# Patient Record
Sex: Male | Born: 1945 | Race: White | Hispanic: No | Marital: Married | Smoking: Current every day smoker
Health system: Southern US, Community
[De-identification: ages and names within clinical notes are randomized; demographics above are authoritative.]

## PROBLEM LIST (undated history)

## (undated) DIAGNOSIS — K819 Cholecystitis, unspecified: Secondary | ICD-10-CM

## (undated) DIAGNOSIS — I447 Left bundle-branch block, unspecified: Secondary | ICD-10-CM

## (undated) DIAGNOSIS — G629 Polyneuropathy, unspecified: Secondary | ICD-10-CM

## (undated) HISTORY — PX: HERNIA REPAIR: SHX51

## (undated) HISTORY — PX: APPENDECTOMY: SHX54

---

## 2005-09-08 ENCOUNTER — Emergency Department (HOSPITAL_COMMUNITY): Admission: EM | Admit: 2005-09-08 | Discharge: 2005-09-08 | Payer: Self-pay | Admitting: Emergency Medicine

## 2005-10-31 ENCOUNTER — Inpatient Hospital Stay (HOSPITAL_COMMUNITY): Admission: EM | Admit: 2005-10-31 | Discharge: 2005-11-01 | Payer: Self-pay | Admitting: *Deleted

## 2012-02-25 ENCOUNTER — Emergency Department (HOSPITAL_BASED_OUTPATIENT_CLINIC_OR_DEPARTMENT_OTHER)
Admission: EM | Admit: 2012-02-25 | Discharge: 2012-02-26 | Disposition: A | Discharge status: Home / Self Care | Payer: Medicare Other | Source: Home / Self Care | Attending: Emergency Medicine | Admitting: Emergency Medicine

## 2012-02-25 ENCOUNTER — Emergency Department (HOSPITAL_BASED_OUTPATIENT_CLINIC_OR_DEPARTMENT_OTHER): Payer: Medicare Other

## 2012-02-25 ENCOUNTER — Encounter (HOSPITAL_BASED_OUTPATIENT_CLINIC_OR_DEPARTMENT_OTHER): Payer: Self-pay | Admitting: *Deleted

## 2012-02-25 DIAGNOSIS — K8020 Calculus of gallbladder without cholecystitis without obstruction: Secondary | ICD-10-CM | POA: Diagnosis present

## 2012-02-25 DIAGNOSIS — E871 Hypo-osmolality and hyponatremia: Secondary | ICD-10-CM | POA: Diagnosis not present

## 2012-02-25 DIAGNOSIS — K831 Obstruction of bile duct: Secondary | ICD-10-CM | POA: Diagnosis present

## 2012-02-25 DIAGNOSIS — Z79899 Other long term (current) drug therapy: Secondary | ICD-10-CM | POA: Insufficient documentation

## 2012-02-25 DIAGNOSIS — F17200 Nicotine dependence, unspecified, uncomplicated: Secondary | ICD-10-CM | POA: Diagnosis present

## 2012-02-25 DIAGNOSIS — R7402 Elevation of levels of lactic acid dehydrogenase (LDH): Secondary | ICD-10-CM | POA: Insufficient documentation

## 2012-02-25 DIAGNOSIS — R109 Unspecified abdominal pain: Secondary | ICD-10-CM | POA: Diagnosis not present

## 2012-02-25 DIAGNOSIS — J189 Pneumonia, unspecified organism: Secondary | ICD-10-CM

## 2012-02-25 DIAGNOSIS — D72819 Decreased white blood cell count, unspecified: Secondary | ICD-10-CM | POA: Diagnosis present

## 2012-02-25 DIAGNOSIS — R509 Fever, unspecified: Secondary | ICD-10-CM

## 2012-02-25 DIAGNOSIS — Z7982 Long term (current) use of aspirin: Secondary | ICD-10-CM

## 2012-02-25 DIAGNOSIS — D6959 Other secondary thrombocytopenia: Secondary | ICD-10-CM | POA: Diagnosis present

## 2012-02-25 DIAGNOSIS — G609 Hereditary and idiopathic neuropathy, unspecified: Secondary | ICD-10-CM | POA: Diagnosis present

## 2012-02-25 DIAGNOSIS — R0981 Nasal congestion: Secondary | ICD-10-CM

## 2012-02-25 DIAGNOSIS — M6282 Rhabdomyolysis: Secondary | ICD-10-CM | POA: Diagnosis present

## 2012-02-25 DIAGNOSIS — R7401 Elevation of levels of liver transaminase levels: Secondary | ICD-10-CM | POA: Insufficient documentation

## 2012-02-25 DIAGNOSIS — A419 Sepsis, unspecified organism: Principal | ICD-10-CM | POA: Diagnosis present

## 2012-02-25 DIAGNOSIS — R6520 Severe sepsis without septic shock: Secondary | ICD-10-CM | POA: Diagnosis present

## 2012-02-25 DIAGNOSIS — I5023 Acute on chronic systolic (congestive) heart failure: Secondary | ICD-10-CM | POA: Diagnosis not present

## 2012-02-25 DIAGNOSIS — I447 Left bundle-branch block, unspecified: Secondary | ICD-10-CM | POA: Diagnosis present

## 2012-02-25 DIAGNOSIS — J9600 Acute respiratory failure, unspecified whether with hypoxia or hypercapnia: Secondary | ICD-10-CM | POA: Diagnosis not present

## 2012-02-25 DIAGNOSIS — R0902 Hypoxemia: Secondary | ICD-10-CM | POA: Diagnosis not present

## 2012-02-25 DIAGNOSIS — E876 Hypokalemia: Secondary | ICD-10-CM | POA: Diagnosis not present

## 2012-02-25 LAB — COMPREHENSIVE METABOLIC PANEL
ALT: 89 U/L — ABNORMAL HIGH (ref 0–53)
AST: 106 U/L — ABNORMAL HIGH (ref 0–37)
Albumin: 3.4 g/dL — ABNORMAL LOW (ref 3.5–5.2)
Chloride: 92 mEq/L — ABNORMAL LOW (ref 96–112)
GFR calc Af Amer: >90 mL/min (ref >90)
Potassium: 4 mEq/L (ref 3.5–5.1)
Total Bilirubin: 0.9 mg/dL (ref 0.3–1.2)

## 2012-02-25 LAB — CBC
MCH: 31.8 pg (ref 26.0–34.0)
MCV: 86.5 fL (ref 78.0–100.0)
Platelets: 99 10*3/uL — ABNORMAL LOW (ref 150–400)
RDW: 12.4 % (ref 11.5–15.5)

## 2012-02-25 LAB — URINALYSIS, ROUTINE W REFLEX MICROSCOPIC
Ketones, ur: 15 mg/dL — AB
Protein, ur: 100 mg/dL — AB
Specific Gravity, Urine: 1.027 (ref 1.005–1.030)
Urobilinogen, UA: 1 mg/dL (ref 0.0–1.0)
pH: 6.5 (ref 5.0–8.0)

## 2012-02-25 LAB — URINE MICROSCOPIC-ADD ON

## 2012-02-25 MED ORDER — IBUPROFEN 800 MG PO TABS
800.0000 mg | ORAL_TABLET | Freq: Once | ORAL | Status: AC
  Administered 2012-02-25: 800 mg via ORAL
  Filled 2012-02-25: qty 1

## 2012-02-25 MED ORDER — SODIUM CHLORIDE 0.9 % IV BOLUS (SEPSIS)
500.0000 mL | Freq: Once | INTRAVENOUS | Status: AC
  Administered 2012-02-25: 500 mL via INTRAVENOUS

## 2012-02-25 MED ORDER — ONDANSETRON HCL 4 MG/2ML IJ SOLN
4.0000 mg | Freq: Once | INTRAMUSCULAR | Status: AC
  Administered 2012-02-25: 4 mg via INTRAVENOUS
  Filled 2012-02-25: qty 2

## 2012-02-25 MED ORDER — MORPHINE SULFATE 4 MG/ML IJ SOLN
4.0000 mg | Freq: Once | INTRAMUSCULAR | Status: AC
  Administered 2012-02-25: 4 mg via INTRAVENOUS
  Filled 2012-02-25: qty 1

## 2012-02-25 NOTE — ED Notes (Signed)
Pt c/o facial pain and nasal congestion x 4 days

## 2012-02-25 NOTE — ED Provider Notes (Signed)
History     CSN: [id number]  Arrival date & time 02/25/12  2107   First MD Initiated Contact with Patient 02/25/12 2205      Chief Complaint  Patient presents with  . Facial Pain  . Nasal Congestion  . Abdominal Pain    (Consider location/radiation/quality/duration/timing/severity/associated sxs/prior treatment) HPI Pt presents with fever, nasal congestion and abdominal pain.  Pt states that over the past several days he has had difficulty breathing through his nose and felt that he had chest congestion, mild cough- nonproductive.  Did not know of fever.  Also c/o epigastric and RUQ pain over the past several days intermittently- described aching and sharp.  +nausea, no vomiting, no diarrhea.  Has noted that his urine has turned darker-orange/red over the past 2 days.  Denies having similar symptoms in the past.  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 Denies other medical history.   Past Medical History  Diagnosis Date  . Peripheral neuropathy   . Neuropathy 02/26/2012  . Left bundle-branch block 02/26/2012    Past Surgical History  Procedure Date  . Appendectomy     History reviewed. No pertinent family history.  History  Substance Use Topics  . Smoking status: Current Everyday Smoker -- 1.0 packs/day  . Smokeless tobacco: Not on file  . Alcohol Use: No      Review of Systems ROS reviewed and all otherwise negative except for mentioned in HPI  Allergies  Review of patient's allergies indicates no known allergies.  Home Medications   No current outpatient prescriptions on file.  BP 125/50  Pulse 90  Temp 100.8 F (38.2 C) (Oral)  Resp 20  Ht 5\' 11"  (1.803 m)  Wt 220 lb (99.791 kg)  BMI 30.68 kg/m2  SpO2 94% Vitals reviewed Physical Exam Physical Examination: General appearance - alert, concerned appearing, and in no distress Mental status - alert, oriented to person, place, and time Mouth - mucous membranes  moist, pharynx normal without lesions Chest - clear to auscultation, no wheezes, rales or rhonchi, symmetric air entry Heart - normal rate, regular rhythm, normal S1, S2, no murmurs, rubs, clicks or gallops Abdomen - soft, nondistended, no masses, ttp over epigastric region and RUQ, no gaurding or rebound tenderness Musculoskeletal - no joint tenderness, deformity or swelling Extremities - peripheral pulses normal, no pedal edema, no clubbing or cyanosis Skin - normal coloration and turgor, no rashes, no petechiae or angiomata  ED Course  Procedures (including critical care time)  Labs Reviewed  CBC - Abnormal; Notable for the following:    WBC 2.9 (*)     RBC 3.77 (*)     Hemoglobin 12.0 (*)     HCT 32.6 (*)     MCHC 36.8 (*)     Platelets 99 (*)     All other components within normal limits  COMPREHENSIVE METABOLIC PANEL - Abnormal; Notable for the following:    Sodium 126 (*)     Chloride 92 (*)     Glucose, Bld 102 (*)     Albumin 3.4 (*)     AST 106 (*)     ALT 89 (*)     Alkaline Phosphatase 196 (*)     All other components within normal limits  URINALYSIS, ROUTINE W REFLEX MICROSCOPIC - Abnormal; Notable for the following:    Color, Urine ORANGE (*)  BIOCHEMICALS MAY BE AFFECTED BY COLOR   Bilirubin Urine MODERATE (*)  Ketones, ur 15 (*)     Protein, ur 100 (*)     Leukocytes, UA SMALL (*)     All other components within normal limits  DIFFERENTIAL - Abnormal; Notable for the following:    Basophils Relative 2 (*)     Lymphs Abs 0.5 (*)     All other components within normal limits  URINE MICROSCOPIC-ADD ON  AMMONIA   Dg Chest Port 1 View  02/26/2012  *RADIOLOGY REPORT*  Clinical Data: Pulmonary infiltrate.  Shortness of breath.  Chest pain.  PORTABLE CHEST - 1 VIEW  Comparison: 06/20 and 02/25/2012  Findings: There is a persistent hazy right perihilar infiltrate in the right upper lobe.  There is also persistent peribronchial thickening in the right lower lobe.   Heart size and vascularity are normal.  Left lung is clear except for peribronchial thickening.  IMPRESSION: Persistent hazy infiltrate in the right upper lobe with bilateral bronchitic changes.  Original Report Authenticated By: Gwynn Burly, M.D.   Dg Chest Portable 1 View  02/26/2012  *RADIOLOGY REPORT*  Clinical Data: Fever  PORTABLE CHEST - 1 VIEW  Comparison: 02/25/2012  Findings: Degraded by hypoaeration.  Interstitial prominence.  Mild right suprahilar and lung base opacities.  Mild cardiomegaly.  Mild central vascular congestion.  No pneumothorax.  No definite pleural effusion.  No acute osseous finding.  IMPRESSION: Interstitial prominence, bibasilar and asymmetric right suprahilar airspace opacities. May reflect pneumonia and/or edema. Allowing for differences in technique, similar to slightly worsened in the interval.  Original Report Authenticated By: Waneta Martins, M.D.   Dg Abd Portable 1v  02/26/2012  *RADIOLOGY REPORT*  Clinical Data: Shortness of breath, chest pain, abdominal pain and pressure  PORTABLE ABDOMEN - 1 VIEW  Comparison: Portable exam 1740 hours without priors for comparison.  Findings: Nonobstructive bowel gas pattern. No bowel dilatation or bowel wall thickening. Scattered gas within large and small bowel loops. Mild endplate spur formation lumbar spine. Osseous demineralization. No urinary tract calcification.  IMPRESSION: Nonobstructive bowel gas pattern.  Original Report Authenticated By: Lollie Marrow, M.D.     1. Fever   2. Abdominal pain   3. Nasal congestion   4. Transaminitis   5. Hyponatremia   6. Community acquired pneumonia       MDM  Pt with fever, abdominal pain, nasal congestion, cough.  New finding of elevated LFTs, hyponatremia, urobilinogen.  Pt given pain meds, IV hydration.  Awaiting CXR and abdominal ultrasound- signed out to Dr. Hyman Hopes at 12:00am pending radiological studies which will help determine disposition.         Ethelda Chick, MD 02/28/12 [phone]

## 2012-02-26 ENCOUNTER — Inpatient Hospital Stay (HOSPITAL_COMMUNITY): Payer: Medicare Other

## 2012-02-26 ENCOUNTER — Inpatient Hospital Stay (HOSPITAL_BASED_OUTPATIENT_CLINIC_OR_DEPARTMENT_OTHER)
Admission: EM | Admit: 2012-02-26 | Discharge: 2012-03-05 | DRG: 871 | Disposition: A | Discharge status: Home / Self Care | Payer: Medicare Other | Source: Ambulatory Visit | Attending: Internal Medicine | Admitting: Internal Medicine

## 2012-02-26 ENCOUNTER — Emergency Department (HOSPITAL_BASED_OUTPATIENT_CLINIC_OR_DEPARTMENT_OTHER): Payer: Medicare Other

## 2012-02-26 ENCOUNTER — Encounter (HOSPITAL_BASED_OUTPATIENT_CLINIC_OR_DEPARTMENT_OTHER): Payer: Self-pay | Admitting: *Deleted

## 2012-02-26 DIAGNOSIS — R0902 Hypoxemia: Secondary | ICD-10-CM

## 2012-02-26 DIAGNOSIS — K831 Obstruction of bile duct: Secondary | ICD-10-CM | POA: Diagnosis not present

## 2012-02-26 DIAGNOSIS — J9600 Acute respiratory failure, unspecified whether with hypoxia or hypercapnia: Secondary | ICD-10-CM | POA: Diagnosis present

## 2012-02-26 DIAGNOSIS — R109 Unspecified abdominal pain: Secondary | ICD-10-CM

## 2012-02-26 DIAGNOSIS — I319 Disease of pericardium, unspecified: Secondary | ICD-10-CM | POA: Diagnosis not present

## 2012-02-26 DIAGNOSIS — R509 Fever, unspecified: Secondary | ICD-10-CM

## 2012-02-26 DIAGNOSIS — Z79899 Other long term (current) drug therapy: Secondary | ICD-10-CM | POA: Diagnosis not present

## 2012-02-26 DIAGNOSIS — M6282 Rhabdomyolysis: Secondary | ICD-10-CM | POA: Diagnosis present

## 2012-02-26 DIAGNOSIS — R6510 Systemic inflammatory response syndrome (SIRS) of non-infectious origin without acute organ dysfunction: Secondary | ICD-10-CM | POA: Diagnosis present

## 2012-02-26 DIAGNOSIS — D649 Anemia, unspecified: Secondary | ICD-10-CM

## 2012-02-26 DIAGNOSIS — R10819 Abdominal tenderness, unspecified site: Secondary | ICD-10-CM | POA: Diagnosis not present

## 2012-02-26 DIAGNOSIS — D72819 Decreased white blood cell count, unspecified: Secondary | ICD-10-CM | POA: Diagnosis present

## 2012-02-26 DIAGNOSIS — A413 Sepsis due to Hemophilus influenzae: Secondary | ICD-10-CM | POA: Diagnosis not present

## 2012-02-26 DIAGNOSIS — R6520 Severe sepsis without septic shock: Secondary | ICD-10-CM | POA: Diagnosis present

## 2012-02-26 DIAGNOSIS — J189 Pneumonia, unspecified organism: Secondary | ICD-10-CM | POA: Diagnosis not present

## 2012-02-26 DIAGNOSIS — I447 Left bundle-branch block, unspecified: Secondary | ICD-10-CM | POA: Diagnosis present

## 2012-02-26 DIAGNOSIS — G609 Hereditary and idiopathic neuropathy, unspecified: Secondary | ICD-10-CM | POA: Diagnosis present

## 2012-02-26 DIAGNOSIS — G629 Polyneuropathy, unspecified: Secondary | ICD-10-CM | POA: Diagnosis present

## 2012-02-26 DIAGNOSIS — E871 Hypo-osmolality and hyponatremia: Secondary | ICD-10-CM | POA: Diagnosis not present

## 2012-02-26 DIAGNOSIS — J159 Unspecified bacterial pneumonia: Secondary | ICD-10-CM

## 2012-02-26 DIAGNOSIS — I059 Rheumatic mitral valve disease, unspecified: Secondary | ICD-10-CM | POA: Diagnosis not present

## 2012-02-26 DIAGNOSIS — D6959 Other secondary thrombocytopenia: Secondary | ICD-10-CM | POA: Diagnosis present

## 2012-02-26 DIAGNOSIS — E876 Hypokalemia: Secondary | ICD-10-CM | POA: Diagnosis not present

## 2012-02-26 DIAGNOSIS — Z7982 Long term (current) use of aspirin: Secondary | ICD-10-CM | POA: Diagnosis not present

## 2012-02-26 DIAGNOSIS — K8020 Calculus of gallbladder without cholecystitis without obstruction: Secondary | ICD-10-CM | POA: Diagnosis not present

## 2012-02-26 DIAGNOSIS — D696 Thrombocytopenia, unspecified: Secondary | ICD-10-CM | POA: Diagnosis present

## 2012-02-26 DIAGNOSIS — E860 Dehydration: Secondary | ICD-10-CM | POA: Diagnosis present

## 2012-02-26 DIAGNOSIS — R918 Other nonspecific abnormal finding of lung field: Secondary | ICD-10-CM | POA: Diagnosis not present

## 2012-02-26 DIAGNOSIS — R0602 Shortness of breath: Secondary | ICD-10-CM | POA: Diagnosis not present

## 2012-02-26 DIAGNOSIS — I5023 Acute on chronic systolic (congestive) heart failure: Secondary | ICD-10-CM | POA: Diagnosis not present

## 2012-02-26 DIAGNOSIS — A419 Sepsis, unspecified organism: Secondary | ICD-10-CM | POA: Diagnosis not present

## 2012-02-26 DIAGNOSIS — J9601 Acute respiratory failure with hypoxia: Secondary | ICD-10-CM | POA: Diagnosis present

## 2012-02-26 DIAGNOSIS — R748 Abnormal levels of other serum enzymes: Secondary | ICD-10-CM | POA: Diagnosis present

## 2012-02-26 DIAGNOSIS — R079 Chest pain, unspecified: Secondary | ICD-10-CM | POA: Diagnosis not present

## 2012-02-26 DIAGNOSIS — F17200 Nicotine dependence, unspecified, uncomplicated: Secondary | ICD-10-CM | POA: Diagnosis present

## 2012-02-26 HISTORY — DX: Left bundle-branch block, unspecified: I44.7

## 2012-02-26 HISTORY — DX: Polyneuropathy, unspecified: G62.9

## 2012-02-26 LAB — CARDIAC PANEL(CRET KIN+CKTOT+MB+TROPI)
Relative Index: 0.8 (ref 0.0–2.5)
Total CK: 1100 U/L — ABNORMAL HIGH (ref 7–232)
Troponin I: <0.3 ng/mL (ref <0.30)

## 2012-02-26 LAB — DIFFERENTIAL
Basophils Absolute: 0.1 10*3/uL (ref 0.0–0.1)
Blasts: 0 %
Eosinophils Absolute: 0.1 10*3/uL (ref 0.0–0.7)
Lymphocytes Relative: 17 % (ref 12–46)
Lymphocytes Relative: 6 % — ABNORMAL LOW (ref 12–46)
Lymphs Abs: 0.3 10*3/uL — ABNORMAL LOW (ref 0.7–4.0)
Monocytes Absolute: 0.3 10*3/uL (ref 0.1–1.0)
Monocytes Absolute: 0.1 10*3/uL (ref 0.1–1.0)
Monocytes Relative: 12 % (ref 3–12)
Monocytes Relative: 3 % (ref 3–12)
Neutro Abs: 1.9 10*3/uL (ref 1.7–7.7)
Neutro Abs: 3.9 10*3/uL (ref 1.7–7.7)
Neutrophils Relative %: 67 % (ref 43–77)
nRBC: 0 /100 WBC

## 2012-02-26 LAB — CREATININE, SERUM
Creatinine, Ser: 0.72 mg/dL (ref 0.50–1.35)
GFR calc Af Amer: >90 mL/min (ref >90)
GFR calc non Af Amer: >90 mL/min (ref >90)

## 2012-02-26 LAB — URINALYSIS, ROUTINE W REFLEX MICROSCOPIC
Ketones, ur: 15 mg/dL — AB
Nitrite: POSITIVE — AB
Specific Gravity, Urine: 1.022 (ref 1.005–1.030)
pH: 6 (ref 5.0–8.0)

## 2012-02-26 LAB — CBC
HCT: 29.4 % — ABNORMAL LOW (ref 39.0–52.0)
MCHC: 35.7 g/dL (ref 30.0–36.0)
Platelets: 98 10*3/uL — ABNORMAL LOW (ref 150–400)
Platelets: 88 10*3/uL — ABNORMAL LOW (ref 150–400)
RDW: 12.4 % (ref 11.5–15.5)
RDW: 12.8 % (ref 11.5–15.5)
WBC: 4.3 10*3/uL (ref 4.0–10.5)
WBC: 3.7 10*3/uL — ABNORMAL LOW (ref 4.0–10.5)

## 2012-02-26 LAB — BASIC METABOLIC PANEL
BUN: 13 mg/dL (ref 6–23)
CO2: 23 mEq/L (ref 19–32)
Chloride: 90 mEq/L — ABNORMAL LOW (ref 96–112)
Creatinine, Ser: 0.7 mg/dL (ref 0.50–1.35)

## 2012-02-26 LAB — LACTIC ACID, PLASMA: Lactic Acid, Venous: 1.1 mmol/L (ref 0.5–2.2)

## 2012-02-26 LAB — PRO B NATRIURETIC PEPTIDE: Pro B Natriuretic peptide (BNP): 949.9 pg/mL — ABNORMAL HIGH (ref 0–125)

## 2012-02-26 LAB — GLUCOSE, CAPILLARY: Glucose-Capillary: 137 mg/dL — ABNORMAL HIGH (ref 70–99)

## 2012-02-26 LAB — STREP PNEUMONIAE URINARY ANTIGEN: Strep Pneumo Urinary Antigen: NEGATIVE

## 2012-02-26 MED ORDER — SENNA 8.6 MG PO TABS
1.0000 | ORAL_TABLET | Freq: Two times a day (BID) | ORAL | Status: DC
  Administered 2012-02-29: 8.6 mg via ORAL
  Filled 2012-02-26 (×9): qty 1

## 2012-02-26 MED ORDER — ASPIRIN 81 MG PO CHEW
81.0000 mg | CHEWABLE_TABLET | Freq: Every day | ORAL | Status: DC
  Administered 2012-02-26 – 2012-03-05 (×9): 81 mg via ORAL
  Filled 2012-02-26 (×9): qty 1

## 2012-02-26 MED ORDER — ALBUTEROL SULFATE (5 MG/ML) 0.5% IN NEBU
INHALATION_SOLUTION | RESPIRATORY_TRACT | Status: AC
  Administered 2012-02-26: 5 mg via RESPIRATORY_TRACT
  Filled 2012-02-26: qty 1

## 2012-02-26 MED ORDER — SODIUM CHLORIDE 0.9 % IJ SOLN
3.0000 mL | Freq: Two times a day (BID) | INTRAMUSCULAR | Status: DC
  Administered 2012-02-28 – 2012-03-05 (×8): 3 mL via INTRAVENOUS

## 2012-02-26 MED ORDER — MORPHINE SULFATE 4 MG/ML IJ SOLN: 4.0000 mg | Freq: Once | INTRAMUSCULAR | Status: DC

## 2012-02-26 MED ORDER — ALBUTEROL SULFATE (5 MG/ML) 0.5% IN NEBU
5.0000 mg | INHALATION_SOLUTION | Freq: Once | RESPIRATORY_TRACT | Status: AC
  Administered 2012-02-26: 5 mg via RESPIRATORY_TRACT

## 2012-02-26 MED ORDER — GUAIFENESIN-DM 100-10 MG/5ML PO SYRP
5.0000 mL | ORAL_SOLUTION | ORAL | Status: DC | PRN
  Administered 2012-02-26 – 2012-02-29 (×4): 5 mL via ORAL
  Filled 2012-02-26 (×4): qty 5

## 2012-02-26 MED ORDER — IPRATROPIUM BROMIDE 0.02 % IN SOLN
RESPIRATORY_TRACT | Status: AC
  Administered 2012-02-26: 0.5 mg via RESPIRATORY_TRACT
  Filled 2012-02-26: qty 2.5

## 2012-02-26 MED ORDER — SODIUM CHLORIDE 0.9 % IV SOLN
INTRAVENOUS | Status: DC
  Administered 2012-02-26 – 2012-02-29 (×7): via INTRAVENOUS
  Administered 2012-03-01: 125 mL/h via INTRAVENOUS
  Administered 2012-03-01: 01:00:00 via INTRAVENOUS

## 2012-02-26 MED ORDER — HYDROCODONE-ACETAMINOPHEN 5-325 MG PO TABS
1.0000 | ORAL_TABLET | ORAL | Status: DC | PRN
  Administered 2012-02-29 – 2012-03-05 (×4): 2 via ORAL
  Filled 2012-02-26 (×4): qty 2

## 2012-02-26 MED ORDER — ACETAMINOPHEN 650 MG RE SUPP: 650.0000 mg | Freq: Four times a day (QID) | RECTAL | Status: DC | PRN

## 2012-02-26 MED ORDER — ENOXAPARIN SODIUM 40 MG/0.4ML ~~LOC~~ SOLN
40.0000 mg | SUBCUTANEOUS | Status: DC
  Filled 2012-02-26 (×2): qty 0.4

## 2012-02-26 MED ORDER — MOXIFLOXACIN HCL 400 MG PO TABS
ORAL_TABLET | ORAL | Status: AC
  Administered 2012-02-26: 400 mg via ORAL
  Filled 2012-02-26: qty 1

## 2012-02-26 MED ORDER — MOXIFLOXACIN HCL 400 MG PO TABS
400.0000 mg | ORAL_TABLET | Freq: Once | ORAL | Status: AC
  Administered 2012-02-26: 400 mg via ORAL

## 2012-02-26 MED ORDER — LORAZEPAM 2 MG/ML IJ SOLN
1.0000 mg | Freq: Once | INTRAMUSCULAR | Status: AC
  Administered 2012-02-26: 1 mg via INTRAVENOUS
  Filled 2012-02-26: qty 1

## 2012-02-26 MED ORDER — ONDANSETRON HCL 4 MG/2ML IJ SOLN
4.0000 mg | Freq: Four times a day (QID) | INTRAMUSCULAR | Status: DC | PRN
  Administered 2012-02-26 – 2012-02-27 (×2): 4 mg via INTRAVENOUS
  Filled 2012-02-26 (×2): qty 2

## 2012-02-26 MED ORDER — ACETAMINOPHEN 325 MG PO TABS
650.0000 mg | ORAL_TABLET | Freq: Four times a day (QID) | ORAL | Status: DC | PRN
  Administered 2012-02-26 – 2012-02-27 (×3): 650 mg via ORAL
  Filled 2012-02-26 (×3): qty 2

## 2012-02-26 MED ORDER — VANCOMYCIN HCL IN DEXTROSE 1-5 GM/200ML-% IV SOLN
1000.0000 mg | Freq: Two times a day (BID) | INTRAVENOUS | Status: DC
  Administered 2012-02-27 – 2012-03-01 (×7): 1000 mg via INTRAVENOUS
  Filled 2012-02-26 (×8): qty 200

## 2012-02-26 MED ORDER — MORPHINE SULFATE 4 MG/ML IJ SOLN
4.0000 mg | Freq: Once | INTRAMUSCULAR | Status: AC
  Administered 2012-02-26: 4 mg via INTRAVENOUS
  Filled 2012-02-26: qty 1

## 2012-02-26 MED ORDER — LEVOFLOXACIN 750 MG PO TABS: 750.0000 mg | ORAL_TABLET | Freq: Every day | ORAL | Status: DC

## 2012-02-26 MED ORDER — IPRATROPIUM BROMIDE 0.02 % IN SOLN
0.5000 mg | Freq: Once | RESPIRATORY_TRACT | Status: AC
  Administered 2012-02-26: 0.5 mg via RESPIRATORY_TRACT

## 2012-02-26 MED ORDER — MOXIFLOXACIN HCL 400 MG PO TABS: 400.0000 mg | ORAL_TABLET | Freq: Every day | ORAL | Status: DC

## 2012-02-26 MED ORDER — VANCOMYCIN HCL 1000 MG IV SOLR
2000.0000 mg | Freq: Once | INTRAVENOUS | Status: AC
  Administered 2012-02-26: 2000 mg via INTRAVENOUS
  Filled 2012-02-26: qty 2000

## 2012-02-26 MED ORDER — MORPHINE SULFATE 2 MG/ML IJ SOLN
1.0000 mg | INTRAMUSCULAR | Status: DC | PRN
  Filled 2012-02-26: qty 1

## 2012-02-26 MED ORDER — ALBUTEROL SULFATE (5 MG/ML) 0.5% IN NEBU: 2.5000 mg | INHALATION_SOLUTION | RESPIRATORY_TRACT | Status: DC | PRN

## 2012-02-26 MED ORDER — ONDANSETRON HCL 4 MG PO TABS: 4.0000 mg | ORAL_TABLET | Freq: Four times a day (QID) | ORAL | Status: DC | PRN

## 2012-02-26 MED ORDER — SODIUM CHLORIDE 0.9 % IV SOLN
INTRAVENOUS | Status: AC
  Administered 2012-02-26: 15:00:00 via INTRAVENOUS

## 2012-02-26 MED ORDER — ASPIRIN 81 MG PO TABS: 81.0000 mg | ORAL_TABLET | Freq: Every day | ORAL | Status: DC

## 2012-02-26 MED ORDER — GABAPENTIN 400 MG PO CAPS
1200.0000 mg | ORAL_CAPSULE | Freq: Three times a day (TID) | ORAL | Status: DC
  Administered 2012-02-26 – 2012-03-05 (×24): 1200 mg via ORAL
  Filled 2012-02-26 (×26): qty 3

## 2012-02-26 MED ORDER — DEXTROSE 5 % IV SOLN
500.0000 mg | INTRAVENOUS | Status: AC
  Administered 2012-02-26 – 2012-03-03 (×7): 500 mg via INTRAVENOUS
  Filled 2012-02-26 (×10): qty 500

## 2012-02-26 MED ORDER — LORAZEPAM 2 MG/ML IJ SOLN
0.5000 mg | Freq: Once | INTRAMUSCULAR | Status: AC
  Administered 2012-02-26: 0.5 mg via INTRAVENOUS
  Filled 2012-02-26: qty 1

## 2012-02-26 MED ORDER — MOXIFLOXACIN HCL IN NACL 400 MG/250ML IV SOLN
400.0000 mg | Freq: Once | INTRAVENOUS | Status: AC
  Administered 2012-02-26: 400 mg via INTRAVENOUS
  Filled 2012-02-26: qty 250

## 2012-02-26 MED ORDER — LEVOFLOXACIN 750 MG PO TABS
750.0000 mg | ORAL_TABLET | Freq: Every day | ORAL | Status: DC
  Filled 2012-02-26: qty 1

## 2012-02-26 MED ORDER — CARBAMAZEPINE 200 MG PO TABS
400.0000 mg | ORAL_TABLET | Freq: Two times a day (BID) | ORAL | Status: DC
  Administered 2012-02-26 – 2012-03-04 (×15): 400 mg via ORAL
  Filled 2012-02-26 (×17): qty 2

## 2012-02-26 MED ORDER — ALUM & MAG HYDROXIDE-SIMETH 200-200-20 MG/5ML PO SUSP
30.0000 mL | Freq: Four times a day (QID) | ORAL | Status: DC | PRN
  Administered 2012-03-01 – 2012-03-04 (×3): 30 mL via ORAL
  Filled 2012-02-26 (×3): qty 30

## 2012-02-26 MED ORDER — PANTOPRAZOLE SODIUM 40 MG PO TBEC
40.0000 mg | DELAYED_RELEASE_TABLET | Freq: Every day | ORAL | Status: DC
  Administered 2012-02-26 – 2012-03-03 (×7): 40 mg via ORAL
  Filled 2012-02-26 (×7): qty 1
  Filled 2012-02-26: qty 2

## 2012-02-26 MED ORDER — CEFEPIME HCL 2 G IJ SOLR
2.0000 g | Freq: Two times a day (BID) | INTRAMUSCULAR | Status: AC
  Administered 2012-02-26 – 2012-03-04 (×14): 2 g via INTRAVENOUS
  Filled 2012-02-26 (×15): qty 2

## 2012-02-26 MED ORDER — GABAPENTIN 300 MG PO CAPS
900.0000 mg | ORAL_CAPSULE | Freq: Once | ORAL | Status: DC
  Filled 2012-02-26: qty 3

## 2012-02-26 NOTE — ED Notes (Signed)
Headache, nausea, fever. Was seen here last night. Feels worse today.

## 2012-02-26 NOTE — ED Provider Notes (Addendum)
BP 125/50  Pulse 90  Temp 100.8 F (38.2 C) (Oral)  Resp 20  Ht 5\' 11"  (1.803 m)  Wt 220 lb (99.791 kg)  BMI 30.68 kg/m2  SpO2 94%   Patient signed out from Dr. Karma Ganja. Abdominal pain, facial congestion, fever. U/S without acute cholecystitis. Patient initially agreeable to CT A/P as plan set forth by Dr. Karma Ganja but then changed his mind. Fever improved and feeling somewhat better although states his peripheral neuropathy is getting worse. No relief with morphine. Offered dilaudid, fentanyl. We do not have neurontin, his home medication. Cont to refuse. He wants to go home. Suspect he wants his family to be able to go home. He will sign out AMA. Discussed extensively risks of leaving up to including sepsis/death and he wants to leave and go to the [facility] tomorrow. Nurse discussed with him as well. He continues to have pain at the time of discharge.   Delay in CXR read by radiology ordered by Dr. Karma Ganja. D/W Dr. Eppie Gibson at 0200. Concern for RUL pneumonia. Low suspicion for TB. Levaquin PO given in ED and for home.   No PO levaquin MCHP. Given dose of avelox. Prescription for same.    Forbes Cellar, MD 02/26/12 9147  Forbes Cellar, MD 02/26/12 8295  Forbes Cellar, MD 02/26/12 6213

## 2012-02-26 NOTE — ED Notes (Signed)
Pt has decided to sign himself out AMA.  Wants to go home at this time and plans to "take this up tomorrow at the [facility]."  Has declined to have the Ct scan.  Will notify MD.

## 2012-02-26 NOTE — ED Notes (Signed)
CXR resulted just prior to pt leaving AMA.  Shows Pneumonia.  Tx ordered and pt agreed to wait for it. Avelox given.

## 2012-02-26 NOTE — ED Notes (Signed)
Previous notes regarding departure condition and discharge charted in error on wrong pt. By this rn.

## 2012-02-26 NOTE — H&P (Signed)
PATIENT DETAILS Name: James Fletcher Age: 66 y.o. Sex: male Date of Birth: [birth year] Admit Date: 02/26/2012 PCP:No primary provider on file.   CHIEF COMPLAINT:  Fever, cough, Shortness of breath and abdominal pain for 4-5 days  HPI: Patient is a 66 year old [place] veteran with a past medical history of known peripheral neuropathy, known left bundle branch block who presents to the hospital for the above-noted complaints. For patient for the past 4-5 days he has not been in his usual state of health, for 5 days ago he said having cough which is mostly dry and at times has brownish phlegm. He claims he also has upper abdominal discomfort as well. He reports a fever as high as 103F at home a few days ago. He also claims to have occasional vomiting, his last vomiting episode was yesterday. Yesterday evening he presented to [facility], weight he was found to have questionable pneumonia and was also found to be leukopenic with significant bandemia. He subsequently signed out AGAINST MEDICAL ADVICE and wanted to go to his primary care practitioner at the [facility]. He returned today again to the emergency room at [facility] with worsening of his symptoms. He now had associated fatigue and some weakness. He also claimed to have shortness of breath. Today he claims to me that his shortness of breath is actually worse when he lies down and better when he is sitting up. I was then consulted over the telephone by the emergency department physician-Dr. Dello-and accepted this patient as a transfer. However in route, patient was noted to be more fidgety and anxious, he was noted to have more oxygen requirement, as a result he was brought down to the step down unit. -Patient denies any diarrhea, he also denies any headache or neck pain. He denies any photophobia. He denies any ongoing or active chest pain in the past few days.  ALLERGIES:  No Known Allergies  PAST MEDICAL HISTORY: Past Medical  History  Diagnosis Date  . Peripheral neuropathy   . Neuropathy 02/26/2012  . Left bundle-branch block 02/26/2012    PAST SURGICAL HISTORY: Past Surgical History  Procedure Date  . Appendectomy     MEDICATIONS AT HOME: Prior to Admission medications   Medication Sig Start Date End Date Taking? Authorizing Provider  aspirin 81 MG tablet Take 81 mg by mouth daily.   Yes Historical Provider, MD  carbamazepine (TEGRETOL) 200 MG tablet Take 400 mg by mouth 2 (two) times daily.    Yes Historical Provider, MD  gabapentin (NEURONTIN) 400 MG capsule Take 1,200 mg by mouth 3 (three) times daily.    Yes Historical Provider, MD  HYDROcodone-acetaminophen (VICODIN) 5-500 MG per tablet Take 1 tablet by mouth every 6 (six) hours as needed. pain   Yes Historical Provider, MD  Multiple Vitamins-Minerals (MULTIVITAMIN WITH MINERALS) tablet Take 1 tablet by mouth daily.   Yes Historical Provider, MD  naproxen (NAPROSYN) 500 MG tablet Take 500 mg by mouth 2 (two) times daily with a meal. Patient uses this medication for pain   Yes Historical Provider, MD  omeprazole (PRILOSEC) 20 MG capsule Take 20 mg by mouth daily. Patient uses this medication for acid reflux.   Yes Historical Provider, MD  traZODone (DESYREL) 50 MG tablet Take 200 mg by mouth at bedtime. For sleep   Yes Historical Provider, MD  VITAMIN D, CHOLECALCIFEROL, PO Take 1 tablet by mouth daily.   Yes Historical Provider, MD    FAMILY HISTORY: No family  history of coronary artery disease.  SOCIAL HISTORY:  reports that he has been smoking-for more than 40 years.He reports that he does not drink alcohol or use illicit drugs.  REVIEW OF SYSTEMS:  Constitutional:   No  weight loss, night sweats. Positive for Fevers, chills, fatigue.  HEENT:    No headaches, Difficulty swallowing,Tooth/dental problems,Sore throat,  No sneezing, itching, ear ache, nasal congestion. Positive for chronic post nasal drip.  Cardio-vascular: No chest pain,   Orthopnea, PND, swelling in lower extremities, anasarca,         dizziness, palpitations  GI:  No heartburn, indigestion, abdominal pain,diarrhea, change in bowel habits, loss of appetite  Resp:  No coughing up of blood.No change in color of mucus.No wheezing.No chest wall deformity  Skin:  no rash or lesions.  GU:  no dysuria, change in color of urine, no urgency or frequency.  No flank pain.  Musculoskeletal: No joint pain or swelling.  No decreased range of motion.  No back pain.  Psych: No change in mood or affect. No depression or anxiety.  No memory loss.   PHYSICAL EXAM: Blood pressure 132/65, pulse 101, temperature 101.8 F (38.8 C), temperature source Oral, resp. rate 22, height 5' 11.5" (1.816 m), weight 107.5 kg (236 lb 15.9 oz), SpO2 97.00%.  General appearance :Awake, alert, in mild to moderate distress. Speech Clear. HEENT: Atraumatic and Normocephalic, pupils equally reactive to light and accomodation Neck: supple. Chest:Good air entry bilaterally, rales by basilar, but more on the right than the left. CVS: S1 S2 regular but tachycardic, no murmurs.  Abdomen: Bowel sounds present, mild epigastric tenderness,not distended with no gaurding, rigidity or rebound. Extremities: B/L Lower Ext shows 1-2 pitting edema, both legs are warm to touch, with  dorsalis pedis pulses palpable. Neurology: Awake alert, and oriented X 3, CN II-XII intact, Non focal, Deep Tendon Reflex-2+ all over, plantar's downgoing B/L, sensory exam is grossly intact.  Skin:No Rash Wounds:N/A  LABS ON ADMISSION:   Basename 02/26/12 1337 02/25/12 2131  NA 125* 126*  K 3.4* 4.0  CL 90* 92*  CO2 23 26  GLUCOSE 99 102*  BUN 13 10  CREATININE 0.70 0.60  CALCIUM 8.2* 8.4  MG -- --  PHOS -- --    Basename 02/25/12 2131  AST 106*  ALT 89*  ALKPHOS 196*  BILITOT 0.9  PROT 6.0  ALBUMIN 3.4*   No results found for this basename: LIPASE:2,AMYLASE:2 in the last 72 hours  Basename 02/26/12  1337 02/25/12 2131  WBC 4.3 2.9*  NEUTROABS 3.9 1.9  HGB 11.5* 12.0*  HCT 31.2* 32.6*  MCV 85.7 86.5  PLT 98* 99*   No results found for this basename: CKTOTAL:3,CKMB:3,CKMBINDEX:3,TROPONINI:3 in the last 72 hours No results found for this basename: DDIMER:2 in the last 72 hours No components found with this basename: POCBNP:3   RADIOLOGIC STUDIES ON ADMISSION: Dg Chest 2 View  02/26/2012  *RADIOLOGY REPORT*  Clinical Data: Chest congestion.  Cough.  Smoker.  Epigastric pain.  CHEST - 2 VIEW  Comparison: 10/31/2005  Findings: Central peribronchial thickening is noted bilaterally. Asymmetric airspace disease is seen in the central right upper lobe, suspicious for pneumonia.  No evidence of pleural effusion. No mass or lymphadenopathy identified.  Heart size is within normal limits.  IMPRESSION: Asymmetric airspace disease and central right upper lobe, suspicious for pneumonia. Post-treatment  radiographic followup recommended to confirm resolution.  Original Report Authenticated By: Danae Orleans, M.D.   US Abdomen Complete  02/26/2012  *  RADIOLOGY REPORT*  Clinical Data:  Epigastric abdominal pain.  Elevated liver function tests.  ABDOMINAL ULTRASOUND COMPLETE  Comparison:  CT on 10/31/2005  Findings:  Gallbladder:  No gallstones, gallbladder wall thickening, or pericholecystic fluid.  Common Bile Duct:  Within normal limits in caliber. Measures 2 mm in diameter.  Liver: Within normal limits in parenchymal echogenicity.  A 1.9 cm hyperechoic mass is seen in the left hepatic lobe which is consistent with a benign hemangioma and stable compared with previous CT.  No other liver masses are identified.  IVC:  Appears normal.  Pancreas:  Not well visualized due to overlying bowel gas.  Spleen:  Within normal limits in size and echotexture.  Right kidney:  Normal in size and parenchymal echogenicity.  No evidence of mass or hydronephrosis.  Left kidney:  Normal in size and parenchymal echogenicity.  No  evidence of mass or hydronephrosis.  Abdominal Aorta:  No aneurysm identified.  IMPRESSION:  1.  No evidence of gallstones, biliary dilatation, or other acute findings. 2.  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   Dg Chest Port 1 View  02/26/2012  *RADIOLOGY REPORT*  Clinical Data: Pulmonary infiltrate.  Shortness of breath.  Chest pain.  PORTABLE CHEST - 1 VIEW  Comparison: 06/20 and 02/25/2012  Findings: There is a persistent hazy right perihilar infiltrate in the right upper lobe.  There is also persistent peribronchial thickening in the right lower lobe.  Heart size and vascularity are normal.  Left lung is clear except for peribronchial thickening.  IMPRESSION: Persistent hazy infiltrate in the right upper lobe with bilateral bronchitic changes.  Original Report Authenticated By: Gwynn Burly, M.D.   Dg Chest Portable 1 View  02/26/2012  *RADIOLOGY REPORT*  Clinical Data: Fever  PORTABLE CHEST - 1 VIEW  Comparison: 02/25/2012  Findings: Degraded by hypoaeration.  Interstitial prominence.  Mild right suprahilar and lung base opacities.  Mild cardiomegaly.  Mild central vascular congestion.  No pneumothorax.  No definite pleural effusion.  No acute osseous finding.  IMPRESSION: Interstitial prominence, bibasilar and asymmetric right suprahilar airspace opacities. May reflect pneumonia and/or edema. Allowing for differences in technique, similar to slightly worsened in the interval.  Original Report Authenticated By: Waneta Martins, M.D.   Dg Abd Portable 1v  02/26/2012  *RADIOLOGY REPORT*  Clinical Data: Shortness of breath, chest pain, abdominal pain and pressure  PORTABLE ABDOMEN - 1 VIEW  Comparison: Portable exam 1740 hours without priors for comparison.  Findings: Nonobstructive bowel gas pattern. No bowel dilatation or bowel wall thickening. Scattered gas within large and  small bowel loops. Mild endplate spur formation lumbar spine. Osseous demineralization. No urinary tract calcification.  IMPRESSION: Nonobstructive bowel gas pattern.  Original Report Authenticated By: Lollie Marrow, M.D.    ASSESSMENT AND PLAN: Present on Admission:  .SIRS (systemic inflammatory response syndrome) -This is likely secondary to community-acquired pneumonia -Patient is currently in the step down unit, he will be given intravenous broad-spectrum antibiotics, he will be placed on intravenous fluids. Blood cultures, urine Legionella and Streptococcus antigen will be ordered. Awaiting lactate and procalcitonin levels.  .Acute respiratory failure with hypoxia  -This is likely secondary to community-acquired pneumonia  -BNP is slightly elevated, he does have chronic lower extremity edema from neuropathy -given overall clinical features with  fever/cough/leukopenia/bandemia and abnormal chest x-ray doubt acute CHF decompensation at this point in time  -Will  cautiously hydrate, and obtain a 2-D echocardiogram  -Will get ABG and titrate oxygen as needed.   .Community acquired pneumonia -He is apparently had 2 doses of Avelox as an outpatient, he now has evidence of surgery in respiratory failure, at this time we will broaden his antibiotic spectrum to vancomycin, cefepime and Zithromax. Depending on his clinical course we we can narrow his antibiotic spectrum.  -Blood cultures, urine for Legionella and streptococcal pneumonia studies have been obtained.   .Hyponatremia -Likely secondary to dehydration or SIADH resulting from pneumonia  -Will hydrate and recheck electrolytes in the morning   .Abnormal transaminases -Liver enzymes elevated, a abdominal ultrasound done yesterday in the emergency room did not show any evidence of gallstones or biliary dilatation.  -Given abdominal pain, I will check a lipase as well.  -? Elevated liver enzymes secondary to  sepsis/SIR's  .Neuropathy -This is a chronic issue for the patient, along with neuropathic pain he does have chronic edema from this. Call and Neurontin.   .Left bundle-branch block -I have no old EKGs to compare, however her old records in E-chart-this is not new. -No active chest pain, however we'll cycle cardiac enzymes given left bundle branch block  .Thrombocytopenia -Likely is secondary to sepsis/SIRS -Monitor CBC closely, if continues to drop then we'll stop Lovenox-for DVT prophylaxis  Further plan will depend as patient's clinical course evolves and further radiologic and laboratory data become available. Patient will be monitored closely.  DVT Prophylaxis: -Lovenox-watch platelet count  Code Status: Full code  Family Communication -Have updated the patient's spouse and daughter at bedside.  Total critical care time spent for admission including face-to-face time equals 75 minutes  Terez Montee 02/26/2012, 6:28 PM

## 2012-02-26 NOTE — ED Notes (Signed)
MD at bedside. 

## 2012-02-26 NOTE — Progress Notes (Signed)
ANTIBIOTIC CONSULT NOTE - INITIAL  Pharmacy Consult for vancomycin and cefepime Indication: rule out pneumonia  No Known Allergies  Patient Measurements: Height: 5' 11.5" (181.6 cm) Weight: 236 lb 15.9 oz (107.5 kg) IBW/kg (Calculated) : 76.45  Adjusted Body Weight: 107.5 kg  Vital Signs: Temp: 101.8 F (38.8 C) (06/20 1748) Temp src: Oral (06/20 1748) BP: 138/56 mmHg (06/20 1800) Pulse Rate: 98  (06/20 1800) Intake/Output from previous day:   Intake/Output from this shift:    Labs:  Basename 02/26/12 1337 02/25/12 2131  WBC 4.3 2.9*  HGB 11.5* 12.0*  PLT 98* 99*  LABCREA -- --  CREATININE 0.70 0.60   Estimated Creatinine Clearance: 115.8 ml/min (by C-G formula based on Cr of 0.7). No results found for this basename: VANCOTROUGH:2,VANCOPEAK:2,VANCORANDOM:2,GENTTROUGH:2,GENTPEAK:2,GENTRANDOM:2,TOBRATROUGH:2,TOBRAPEAK:2,TOBRARND:2,AMIKACINPEAK:2,AMIKACINTROU:2,AMIKACIN:2, in the last 72 hours   Microbiology: No results found for this or any previous visit (from the past 720 hour(s)).  Medical History: Past Medical History  Diagnosis Date  . Peripheral neuropathy   . Neuropathy 02/26/2012  . Left bundle-branch block 02/26/2012    Medications:  Scheduled:    . sodium chloride   Intravenous STAT  . albuterol  5 mg Nebulization Once  . albuterol  5 mg Nebulization Once  . aspirin  81 mg Oral Daily  . azithromycin  500 mg Intravenous Q24H  . carbamazepine  400 mg Oral BID  . enoxaparin  40 mg Subcutaneous Q24H  . gabapentin  1,200 mg Oral TID  . ipratropium  0.5 mg Nebulization Once  . LORazepam  1 mg Intravenous Once  . moxifloxacin  400 mg Intravenous Once  . pantoprazole  40 mg Oral Q supper  . senna  1 tablet Oral BID  . sodium chloride  3 mL Intravenous Q12H  . DISCONTD: aspirin  81 mg Oral Daily  . DISCONTD:  morphine injection  4 mg Intravenous Once  . DISCONTD:  morphine injection  4 mg Intravenous Once   Infusions:    . sodium chloride 100 mL/hr  at 02/26/12 1821   Assessment: 66 yo male with suspected pneumonia will be put on vancomycin and cefepime.  SCr 0.7; CrCl > 100  Goal of Therapy:  Vancomycin trough level 15-20 mcg/ml  Plan:  1) cefepime 2g iv q12h 2) Vancomycin 2g iv x1, then 1g iv q12h. 3) Check vancomycin trough when it's appropriate.  Shelita Steptoe, Tsz-Yin 02/26/2012,6:34 PM

## 2012-02-26 NOTE — ED Provider Notes (Signed)
History     CSN: [id number]  Arrival date & time 02/26/12  1248   First MD Initiated Contact with Patient 02/26/12 1323      Chief Complaint  Patient presents with  . Nausea    (Consider location/radiation/quality/duration/timing/severity/associated sxs/prior treatment) HPI Comments: Patient was seen here last night for similar symptoms.  He reports being sick for the past week with cough, congestion, abd pain.  He had an ultrasound last night that was negative for gallstones.  Chest xray was positive for pneumonia.  Was to have a ct of the abdomen and pelvis, but refused and left AMA.  He returns with continued fatigue, weakness, shortness of breath.    The history is provided by the patient.    Past Medical History  Diagnosis Date  . Peripheral neuropathy     Past Surgical History  Procedure Date  . Appendectomy     No family history on file.  History  Substance Use Topics  . Smoking status: Current Everyday Smoker -- 1.0 packs/day  . Smokeless tobacco: Not on file  . Alcohol Use: No      Review of Systems  All other systems reviewed and are negative.    Allergies  Review of patient's allergies indicates no known allergies.  Home Medications   Current Outpatient Rx  Name Route Sig Dispense Refill  . ASPIRIN 81 MG PO TABS Oral Take 81 mg by mouth daily.    Marland Kitchen CARBAMAZEPINE 200 MG PO TABS Oral Take 200 mg by mouth 2 (two) times daily.    Marland Kitchen GABAPENTIN 400 MG PO CAPS Oral Take 400 mg by mouth 3 (three) times daily.    Marland Kitchen HYDROCODONE-ACETAMINOPHEN 5-500 MG PO TABS Oral Take 1 tablet by mouth every 6 (six) hours as needed. Patient uses this medication prn.    Marland Kitchen MOXIFLOXACIN HCL 400 MG PO TABS Oral Take 1 tablet (400 mg total) by mouth daily. 10 tablet 0  . NAPROXEN 500 MG PO TABS Oral Take 500 mg by mouth 2 (two) times daily with a meal. Patient uses this medication for pain prn.    . OMEPRAZOLE 20 MG PO CPDR Oral Take 20 mg by mouth daily. Patient uses this  medication for acid reflux.    . TRAZODONE HCL 50 MG PO TABS Oral Take 50 mg by mouth at bedtime. Patient is to use 200 milligrams of this medication at night for sleep.      BP 134/75  Pulse 95  Temp 98.7 F (37.1 C) (Oral)  Resp 22  SpO2 95%  Physical Exam  Nursing note and vitals reviewed. Constitutional: He is oriented to person, place, and time. He appears well-developed and well-nourished. No distress.       Patient appears anxious, uncomfortable.    HENT:  Head: Normocephalic and atraumatic.  Neck: Normal range of motion. Neck supple.  Cardiovascular: Normal rate and regular rhythm.   No murmur heard. Pulmonary/Chest: No respiratory distress.       Rales are noted in the right lung.    Abdominal: Soft. Bowel sounds are normal. He exhibits no distension.  Musculoskeletal: Normal range of motion. He exhibits no edema.  Neurological: He is alert and oriented to person, place, and time. No cranial nerve deficit.  Skin: Skin is warm and dry. He is not diaphoretic.    ED Course  Procedures (including critical care time)  Labs Reviewed  CBC - Abnormal; Notable for the following:    RBC 3.64 (*)  Hemoglobin 11.5 (*)     HCT 31.2 (*)     MCHC 36.9 (*)     Platelets 98 (*)     All other components within normal limits  DIFFERENTIAL  BASIC METABOLIC PANEL   Dg Chest 2 View  02/26/2012  *RADIOLOGY REPORT*  Clinical Data: Chest congestion.  Cough.  Smoker.  Epigastric pain.  CHEST - 2 VIEW  Comparison: 10/31/2005  Findings: Central peribronchial thickening is noted bilaterally. Asymmetric airspace disease is seen in the central right upper lobe, suspicious for pneumonia.  No evidence of pleural effusion. No mass or lymphadenopathy identified.  Heart size is within normal limits.  IMPRESSION: Asymmetric airspace disease and central right upper lobe, suspicious for pneumonia. Post-treatment  radiographic followup recommended to confirm resolution.  Original Report Authenticated  By: Danae Orleans, M.D.   US Abdomen Complete  02/26/2012  *RADIOLOGY REPORT*  Clinical Data:  Epigastric abdominal pain.  Elevated liver function tests.  ABDOMINAL ULTRASOUND COMPLETE  Comparison:  CT on 10/31/2005  Findings:  Gallbladder:  No gallstones, gallbladder wall thickening, or pericholecystic fluid.  Common Bile Duct:  Within normal limits in caliber. Measures 2 mm in diameter.  Liver: Within normal limits in parenchymal echogenicity.  A 1.9 cm hyperechoic mass is seen in the left hepatic lobe which is consistent with a benign hemangioma and stable compared with previous CT.  No other liver masses are identified.  IVC:  Appears normal.  Pancreas:  Not well visualized due to overlying bowel gas.  Spleen:  Within normal limits in size and echotexture.  Right kidney:  Normal in size and parenchymal echogenicity.  No evidence of mass or hydronephrosis.  Left kidney:  Normal in size and parenchymal echogenicity.  No evidence of mass or hydronephrosis.  Abdominal Aorta:  No aneurysm identified.  IMPRESSION:  1.  No evidence of gallstones, biliary dilatation, or other acute findings. 2.  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   Dg Chest Portable 1 View  02/26/2012  *RADIOLOGY REPORT*  Clinical Data: Fever  PORTABLE CHEST - 1 VIEW  Comparison: 02/25/2012  Findings: Degraded by hypoaeration.  Interstitial prominence.  Mild right suprahilar and lung base opacities.  Mild cardiomegaly.  Mild central vascular congestion.  No pneumothorax.  No definite pleural effusion.  No acute osseous finding.  IMPRESSION: Interstitial prominence, bibasilar and asymmetric right suprahilar airspace opacities. May reflect pneumonia and/or edema. Allowing for differences in technique, similar to slightly worsened in the interval.  Original Report Authenticated By: Waneta Martins, M.D.     No diagnosis  found.    MDM  The patient returns with what appears to be a pneumonia.  The wbc is normal, however there is significant bandemia.  His sodium is 125 and oxygen saturations are 90% on room air.  He will require admission.  I have spoken with Dr. Jerral Ralph at [facility] who agrees to accept in transfer.  Will add bnp and give avelox.        Geoffery Lyons, MD 02/26/12 1455

## 2012-02-26 NOTE — Discharge Instructions (Signed)
Pneumonia, Adult Pneumonia is an infection of the lungs.  CAUSES Pneumonia may be caused by bacteria or a virus. Usually, these infections are caused by breathing infectious particles into the lungs (respiratory tract). SYMPTOMS   Cough.   Fever.   Chest pain.   Increased rate of breathing.   Wheezing.   Mucus production.  DIAGNOSIS  If you have the common symptoms of pneumonia, your caregiver will typically confirm the diagnosis with a chest X-ray. The X-ray will show an abnormality in the lung (pulmonary infiltrate) if you have pneumonia. Other tests of your blood, urine, or sputum may be done to find the specific cause of your pneumonia. Your caregiver may also do tests (blood gases or pulse oximetry) to see how well your lungs are working. TREATMENT  Some forms of pneumonia may be spread to other people when you cough or sneeze. You may be asked to wear a mask before and during your exam. Pneumonia that is caused by bacteria is treated with antibiotic medicine. Pneumonia that is caused by the influenza virus may be treated with an antiviral medicine. Most other viral infections must run their course. These infections will not respond to antibiotics.  PREVENTION A pneumococcal shot (vaccine) is available to prevent a common bacterial cause of pneumonia. This is usually suggested for:  People over 65 years old.   Patients on chemotherapy.   People with chronic lung problems, such as bronchitis or emphysema.   People with immune system problems.  If you are over 65 or have a high risk condition, you may receive the pneumococcal vaccine if you have not received it before. In some countries, a routine influenza vaccine is also recommended. This vaccine can help prevent some cases of pneumonia.You may be offered the influenza vaccine as part of your care. If you smoke, it is time to quit. You may receive instructions on how to stop smoking. Your caregiver can provide medicines and  counseling to help you quit. HOME CARE INSTRUCTIONS   Cough suppressants may be used if you are losing too much rest. However, coughing protects you by clearing your lungs. You should avoid using cough suppressants if you can.   Your caregiver may have prescribed medicine if he or she thinks your pneumonia is caused by a bacteria or influenza. Finish your medicine even if you start to feel better.   Your caregiver may also prescribe an expectorant. This loosens the mucus to be coughed up.   Only take over-the-counter or prescription medicines for pain, discomfort, or fever as directed by your caregiver.   Do not smoke. Smoking is a common cause of bronchitis and can contribute to pneumonia. If you are a smoker and continue to smoke, your cough may last several weeks after your pneumonia has cleared.   A cold steam vaporizer or humidifier in your room or home may help loosen mucus.   Coughing is often worse at night. Sleeping in a semi-upright position in a recliner or using a couple pillows under your head will help with this.   Get rest as you feel it is needed. Your body will usually let you know when you need to rest.  SEEK IMMEDIATE MEDICAL CARE IF:   Your illness becomes worse. This is especially true if you are elderly or weakened from any other disease.   You cannot control your cough with suppressants and are losing sleep.   You begin coughing up blood.   You develop pain which is getting worse or   is uncontrolled with medicines.   You have a fever.   Any of the symptoms which initially brought you in for treatment are getting worse rather than better.   You develop shortness of breath or chest pain.  MAKE SURE YOU:   Understand these instructions.   Will watch your condition.   Will get help right away if you are not doing well or get worse.  Document Released: 08/25/2005 Document Revised: 08/14/2011 Document Reviewed: 11/14/2010 [facility] Patient Information 2012  [place], [place].  Fever, Adult A fever is a higher than normal body temperature. In an adult, an oral temperature around 98.6 F (37 C) is considered normal. A temperature of 100.4 F (38 C) or higher is generally considered a fever. Mild or moderate fevers generally have no long-term effects and often do not require treatment. Extreme fever (greater than or equal to 106 F or 41.1 C) can cause seizures. The sweating that may occur with repeated or prolonged fever may cause dehydration. Elderly people can develop confusion during a fever. A measured temperature can vary with:  Age.   Time of day.   Method of measurement (mouth, underarm, rectal, or ear).  The fever is confirmed by taking a temperature with a thermometer. Temperatures can be taken different ways. Some methods are accurate and some are not.  An oral temperature is used most commonly. Electronic thermometers are fast and accurate.   An ear temperature will only be accurate if the thermometer is positioned as recommended by the manufacturer.   A rectal temperature is accurate and done for those adults who have a condition where an oral temperature cannot be taken.   An underarm (axillary) temperature is not accurate and not recommended.  Fever is a symptom, not a disease.  CAUSES   Infections commonly cause fever.   Some noninfectious causes for fever include:   Some arthritis conditions.   Some thyroid or adrenal gland conditions.   Some immune system conditions.   Some types of cancer.   A medicine reaction.   High doses of certain street drugs such as methamphetamine.   Dehydration.   Exposure to high outside or room temperatures.   Occasionally, the source of a fever cannot be determined. This is sometimes called a "fever of unknown origin" (FUO).   Some situations may lead to a temporary rise in body temperature that may go away on its own. Examples are:   Childbirth.   Surgery.   Intense  exercise.  HOME CARE INSTRUCTIONS   Take appropriate medicines for fever. Follow dosing instructions carefully. If you use acetaminophen to reduce the fever, be careful to avoid taking other medicines that also contain acetaminophen. Do not take aspirin for a fever if you are younger than age 35. There is an association with Reye's syndrome. Reye's syndrome is a rare but potentially deadly disease.   If an infection is present and antibiotics have been prescribed, take them as directed. Finish them even if you start to feel better.   Rest as needed.   Maintain an adequate fluid intake. To prevent dehydration during an illness with prolonged or recurrent fever, you may need to drink extra fluid.Drink enough fluids to keep your urine clear or pale yellow.   Sponging or bathing with room temperature water may help reduce body temperature. Do not use ice water or alcohol sponge baths.   Dress comfortably, but do not over-bundle.  SEEK MEDICAL CARE IF:   You are unable to keep fluids down.  You develop vomiting or diarrhea.   You are not feeling at least partly better after 3 days.   You develop new symptoms or problems.  SEEK IMMEDIATE MEDICAL CARE IF:   You have shortness of breath or trouble breathing.   You develop excessive weakness.   You are dizzy or you faint.   You are extremely thirsty or you are making little or no urine.   You develop new pain that was not there before (such as in the head, neck, chest, back, or abdomen).   You have persistant vomiting and diarrhea for more than 1 to 2 days.   You develop a stiff neck or your eyes become sensitive to light.   You develop a skin rash.   You have a fever or persistent symptoms for more than 2 to 3 days.   You have a fever and your symptoms suddenly get worse.  MAKE SURE YOU:   Understand these instructions.   Will watch your condition.   Will get help right away if you are not doing well or get worse.    Document Released: 02/18/2001 Document Revised: 08/14/2011 Document Reviewed: 06/26/2011 [employer] Patient Information 2012 [place], [place].  Abdominal Pain Abdominal pain can be caused by many things. Your caregiver decides the seriousness of your pain by an examination and possibly blood tests and X-rays. Many cases can be observed and treated at home. Most abdominal pain is not caused by a disease and will probably improve without treatment. However, in many cases, more time must pass before a clear cause of the pain can be found. Before that point, it may not be known if you need more testing, or if hospitalization or surgery is needed. HOME CARE INSTRUCTIONS   Do not take laxatives unless directed by your caregiver.   Take pain medicine only as directed by your caregiver.   Only take over-the-counter or prescription medicines for pain, discomfort, or fever as directed by your caregiver.   Try a clear liquid diet (broth, tea, or water) for as long as directed by your caregiver. Slowly move to a bland diet as tolerated.  SEEK IMMEDIATE MEDICAL CARE IF:   The pain does not go away.   You have a fever.   You keep throwing up (vomiting).   The pain is felt only in portions of the abdomen. Pain in the right side could possibly be appendicitis. In an adult, pain in the left lower portion of the abdomen could be colitis or diverticulitis.   You pass bloody or black tarry stools.  MAKE SURE YOU:   Understand these instructions.   Will watch your condition.   Will get help right away if you are not doing well or get worse.  Document Released: 06/04/2005 Document Revised: 08/14/2011 Document Reviewed: 04/12/2008 [facility] Patient Information 2012 [place], [place].

## 2012-02-27 DIAGNOSIS — I059 Rheumatic mitral valve disease, unspecified: Secondary | ICD-10-CM

## 2012-02-27 DIAGNOSIS — M6282 Rhabdomyolysis: Secondary | ICD-10-CM | POA: Diagnosis present

## 2012-02-27 DIAGNOSIS — J9600 Acute respiratory failure, unspecified whether with hypoxia or hypercapnia: Secondary | ICD-10-CM

## 2012-02-27 DIAGNOSIS — R109 Unspecified abdominal pain: Secondary | ICD-10-CM | POA: Diagnosis present

## 2012-02-27 DIAGNOSIS — D72819 Decreased white blood cell count, unspecified: Secondary | ICD-10-CM | POA: Diagnosis present

## 2012-02-27 DIAGNOSIS — E871 Hypo-osmolality and hyponatremia: Secondary | ICD-10-CM

## 2012-02-27 DIAGNOSIS — E876 Hypokalemia: Secondary | ICD-10-CM | POA: Diagnosis not present

## 2012-02-27 DIAGNOSIS — R509 Fever, unspecified: Secondary | ICD-10-CM | POA: Diagnosis present

## 2012-02-27 DIAGNOSIS — J159 Unspecified bacterial pneumonia: Secondary | ICD-10-CM

## 2012-02-27 DIAGNOSIS — D649 Anemia, unspecified: Secondary | ICD-10-CM | POA: Diagnosis not present

## 2012-02-27 DIAGNOSIS — A413 Sepsis due to Hemophilus influenzae: Secondary | ICD-10-CM

## 2012-02-27 LAB — CARDIAC PANEL(CRET KIN+CKTOT+MB+TROPI)
CK, MB: 14.9 ng/mL (ref 0.3–4.0)
Relative Index: 1 (ref 0.0–2.5)
Total CK: 1379 U/L — ABNORMAL HIGH (ref 7–232)
Troponin I: <0.3 ng/mL (ref <0.30)
Troponin I: <0.3 ng/mL (ref <0.30)

## 2012-02-27 LAB — BLOOD GAS, ARTERIAL
Acid-base deficit: 1.1 mmol/L (ref 0.0–2.0)
Bicarbonate: 22.7 mEq/L (ref 20.0–24.0)
Drawn by: 281201
FIO2: 0.28 %
O2 Saturation: 95.1 %
Patient temperature: 98.6
TCO2: 23.8 mmol/L (ref 0–100)
pCO2 arterial: 35.3 mmHg (ref 35.0–45.0)
pH, Arterial: 7.425 (ref 7.350–7.450)
pO2, Arterial: 75.1 mmHg — ABNORMAL LOW (ref 80.0–100.0)

## 2012-02-27 LAB — RETICULOCYTES
RBC.: 3.44 MIL/uL — ABNORMAL LOW (ref 4.22–5.81)
Retic Count, Absolute: 13.8 10*3/uL — ABNORMAL LOW (ref 19.0–186.0)
Retic Ct Pct: 0.4 % (ref 0.4–3.1)

## 2012-02-27 LAB — BASIC METABOLIC PANEL
Calcium: 7.6 mg/dL — ABNORMAL LOW (ref 8.4–10.5)
Creatinine, Ser: 0.67 mg/dL (ref 0.50–1.35)
GFR calc non Af Amer: >90 mL/min (ref >90)
Sodium: 129 mEq/L — ABNORMAL LOW (ref 135–145)

## 2012-02-27 LAB — COMPREHENSIVE METABOLIC PANEL
ALT: 101 U/L — ABNORMAL HIGH (ref 0–53)
Albumin: 2.8 g/dL — ABNORMAL LOW (ref 3.5–5.2)
Alkaline Phosphatase: 162 U/L — ABNORMAL HIGH (ref 39–117)
Calcium: 7.3 mg/dL — ABNORMAL LOW (ref 8.4–10.5)
GFR calc Af Amer: >90 mL/min (ref >90)
Potassium: 3 mEq/L — ABNORMAL LOW (ref 3.5–5.1)
Sodium: 125 mEq/L — ABNORMAL LOW (ref 135–145)
Total Protein: 5.2 g/dL — ABNORMAL LOW (ref 6.0–8.3)

## 2012-02-27 LAB — PRO B NATRIURETIC PEPTIDE: Pro B Natriuretic peptide (BNP): 1101 pg/mL — ABNORMAL HIGH (ref 0–125)

## 2012-02-27 LAB — SODIUM, URINE, RANDOM: Sodium, Ur: 14 mEq/L

## 2012-02-27 LAB — CBC
Hemoglobin: 10.4 g/dL — ABNORMAL LOW (ref 13.0–17.0)
MCH: 31.3 pg (ref 26.0–34.0)
MCHC: 36.7 g/dL — ABNORMAL HIGH (ref 30.0–36.0)
Platelets: 76 10*3/uL — ABNORMAL LOW (ref 150–400)
RDW: 12.9 % (ref 11.5–15.5)

## 2012-02-27 LAB — VITAMIN B12: Vitamin B-12: 1177 pg/mL — ABNORMAL HIGH (ref 211–911)

## 2012-02-27 LAB — EXPECTORATED SPUTUM ASSESSMENT W GRAM STAIN, RFLX TO RESP C

## 2012-02-27 LAB — FOLATE: Folate: >20 ng/mL

## 2012-02-27 LAB — IRON AND TIBC
Saturation Ratios: 5 % — ABNORMAL LOW (ref 20–55)
UIBC: 193 ug/dL (ref 125–400)

## 2012-02-27 LAB — GLUCOSE, CAPILLARY: Glucose-Capillary: 111 mg/dL — ABNORMAL HIGH (ref 70–99)

## 2012-02-27 LAB — INFLUENZA PANEL BY PCR (TYPE A & B): H1N1 flu by pcr: NOT DETECTED

## 2012-02-27 MED ORDER — ALBUTEROL SULFATE (5 MG/ML) 0.5% IN NEBU
2.5000 mg | INHALATION_SOLUTION | RESPIRATORY_TRACT | Status: DC
  Administered 2012-02-27 – 2012-02-29 (×13): 2.5 mg via RESPIRATORY_TRACT
  Filled 2012-02-27 (×13): qty 0.5

## 2012-02-27 MED ORDER — PERFLUTREN LIPID MICROSPHERE 6.52 MG/ML IV SUSP
10.0000 uL/kg | Freq: Once | INTRAVENOUS | Status: AC
  Administered 2012-02-27: 1.177 mg via INTRAVENOUS
  Filled 2012-02-27: qty 2

## 2012-02-27 MED ORDER — LORAZEPAM 2 MG/ML IJ SOLN
INTRAMUSCULAR | Status: AC
  Filled 2012-02-27: qty 1

## 2012-02-27 MED ORDER — ALBUTEROL SULFATE (5 MG/ML) 0.5% IN NEBU
5.0000 mg | INHALATION_SOLUTION | RESPIRATORY_TRACT | Status: DC
  Administered 2012-02-27 (×3): 5 mg via RESPIRATORY_TRACT
  Filled 2012-02-27: qty 1
  Filled 2012-02-27 (×2): qty 0.5

## 2012-02-27 MED ORDER — LORAZEPAM 2 MG/ML IJ SOLN
0.5000 mg | Freq: Three times a day (TID) | INTRAMUSCULAR | Status: DC | PRN
  Administered 2012-02-27 – 2012-02-28 (×2): 1 mg via INTRAVENOUS
  Administered 2012-02-29: 0.5 mg via INTRAVENOUS
  Administered 2012-02-29 – 2012-03-03 (×4): 1 mg via INTRAVENOUS
  Filled 2012-02-27 (×7): qty 1

## 2012-02-27 MED ORDER — IPRATROPIUM BROMIDE 0.02 % IN SOLN
0.5000 mg | RESPIRATORY_TRACT | Status: DC
  Administered 2012-02-27 (×3): 0.5 mg via RESPIRATORY_TRACT
  Filled 2012-02-27 (×3): qty 2.5

## 2012-02-27 MED ORDER — POTASSIUM CHLORIDE 10 MEQ/100ML IV SOLN
10.0000 meq | INTRAVENOUS | Status: AC
  Administered 2012-02-27 (×2): 10 meq via INTRAVENOUS
  Filled 2012-02-27 (×2): qty 100

## 2012-02-27 MED ORDER — SODIUM CHLORIDE 0.9 % IJ SOLN
INTRAMUSCULAR | Status: AC
  Filled 2012-02-27: qty 10

## 2012-02-27 MED ORDER — POTASSIUM CHLORIDE CRYS ER 20 MEQ PO TBCR
40.0000 meq | EXTENDED_RELEASE_TABLET | Freq: Once | ORAL | Status: AC
  Administered 2012-02-27: 40 meq via ORAL
  Filled 2012-02-27: qty 2

## 2012-02-27 MED ORDER — IPRATROPIUM BROMIDE 0.02 % IN SOLN
0.5000 mg | RESPIRATORY_TRACT | Status: DC
  Administered 2012-02-27 – 2012-03-01 (×17): 0.5 mg via RESPIRATORY_TRACT
  Filled 2012-02-27 (×17): qty 2.5

## 2012-02-27 MED ORDER — BISMUTH SUBSALICYLATE 262 MG/15ML PO SUSP
15.0000 mL | Freq: Four times a day (QID) | ORAL | Status: DC | PRN
  Filled 2012-02-27: qty 236

## 2012-02-27 MED FILL — Perflutren Lipid Microsphere IV Susp 6.52 MG/ML: INTRAVENOUS | Qty: 2 | Status: AC

## 2012-02-27 NOTE — Progress Notes (Signed)
  Echocardiogram 2D Echocardiogram has been performed.  James Fletcher James Fletcher 02/27/2012, 10:30 AM

## 2012-02-27 NOTE — Progress Notes (Signed)
Patient asked to be put back on Bi-Pap following lunch. Patient had increased WOB and said he felt he rested more comfortably with the Bi-Pap. Patient is on 12/6, 40%. RT will continue to monitor patient.

## 2012-02-27 NOTE — Progress Notes (Signed)
[facility]   Subjective: He is alert and anxious. Also having some minor confusion and it is and clarify the patient is somewhat sleep deprived having not slept for at least 36 hours. He is also having persistent coughing and respiratory symptoms for about 1 wk. We ordered BiPAP- since application patient seems to be resting more comfortably.  Objective: Vital signs in last 24 hours: Temp:  [97 F (36.1 C)-101.8 F (38.8 C)] 97 F (36.1 C) (06/21 0839) Pulse Rate:  [77-109] 82  (06/21 0839) Resp:  [13-30] 13  (06/21 0839) BP: (114-149)/(48-82) 132/81 mmHg (06/21 0839) SpO2:  [90 %-100 %] 100 % (06/21 0839) FiO2 (%):  [28 %-97 %] 40 % (06/21 0839) Weight:  [107.2 kg (236 lb 5.3 oz)-107.5 kg (236 lb 15.9 oz)] 107.2 kg (236 lb 5.3 oz) (06/21 0443) Weight change:     Intake/Output from previous day: 06/20 0701 - 06/21 0700 In: 850 [I.V.:700; IV Piggyback:150] Out: 1175 [Urine:1175] Intake/Output this shift:    General appearance: alert, cooperative, appears older than stated age and moderate distress Resp: Scattered wheezes with basilar crackles bilaterally, now on BiPAP with 40% bleed in maintaining saturations 100% previous tachypnea has resolved after application of Cardio: regular rate and rhythm first degree AV block with wide QRS, S1, S2 normal, no murmur, click, rub or gallop; IV fluids have been increased from 100 cc an hour to 150 cc per hour GI: soft, non-tender; bowel sounds normal; no masses,  no organomegaly Extremities: extremities normal, atraumatic, no cyanosis or edema Neurologic: Grossly normal  Lab Results:  Basename 02/27/12 0153 02/26/12 1830  WBC 4.8 3.7*  HGB 10.4* 10.5*  HCT 28.3* 29.4*  PLT 76* 88*   BMET  Basename 02/27/12 0153 02/26/12 1830 02/26/12 1337  NA 125* -- 125*  K 3.0* -- 3.4*  CL 92* -- 90*  CO2 22 -- 23  GLUCOSE 104* -- 99  BUN 12 -- 13  CREATININE 0.67 0.72 --  CALCIUM 7.3* -- 8.2*    Studies/Results: Dg Chest 2  View  02/26/2012  *RADIOLOGY REPORT*  Clinical Data: Chest congestion.  Cough.  Smoker.  Epigastric pain.  CHEST - 2 VIEW  Comparison: 10/31/2005  Findings: Central peribronchial thickening is noted bilaterally. Asymmetric airspace disease is seen in the central right upper lobe, suspicious for pneumonia.  No evidence of pleural effusion. No mass or lymphadenopathy identified.  Heart size is within normal limits.  IMPRESSION: Asymmetric airspace disease and central right upper lobe, suspicious for pneumonia. Post-treatment  radiographic followup recommended to confirm resolution.  Original Report Authenticated By: Danae Orleans, M.D.   US Abdomen Complete  02/26/2012  *RADIOLOGY REPORT*  Clinical Data:  Epigastric abdominal pain.  Elevated liver function tests.  ABDOMINAL ULTRASOUND COMPLETE  Comparison:  CT on 10/31/2005  Findings:  Gallbladder:  No gallstones, gallbladder wall thickening, or pericholecystic fluid.  Common Bile Duct:  Within normal limits in caliber. Measures 2 mm in diameter.  Liver: Within normal limits in parenchymal echogenicity.  A 1.9 cm hyperechoic mass is seen in the left hepatic lobe which is consistent with a benign hemangioma and stable compared with previous CT.  No other liver masses are identified.  IVC:  Appears normal.  Pancreas:  Not well visualized due to overlying bowel gas.  Spleen:  Within normal limits in size and echotexture.  Right kidney:  Normal in size and parenchymal echogenicity.  No evidence of mass or hydronephrosis.  Left kidney:  Normal in size and parenchymal echogenicity.  No evidence of mass or hydronephrosis.  Abdominal Aorta:  No aneurysm identified.  IMPRESSION:  1.  No evidence of gallstones, biliary dilatation, or other acute findings. 2.  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   Dg Chest Port 1 View  02/26/2012  *RADIOLOGY REPORT*   Clinical Data: Pulmonary infiltrate.  Shortness of breath.  Chest pain.  PORTABLE CHEST - 1 VIEW  Comparison: 06/20 and 02/25/2012  Findings: There is a persistent hazy right perihilar infiltrate in the right upper lobe.  There is also persistent peribronchial thickening in the right lower lobe.  Heart size and vascularity are normal.  Left lung is clear except for peribronchial thickening.  IMPRESSION: Persistent hazy infiltrate in the right upper lobe with bilateral bronchitic changes.  Original Report Authenticated By: Gwynn Burly, M.D.   Dg Chest Portable 1 View  02/26/2012  *RADIOLOGY REPORT*  Clinical Data: Fever  PORTABLE CHEST - 1 VIEW  Comparison: 02/25/2012  Findings: Degraded by hypoaeration.  Interstitial prominence.  Mild right suprahilar and lung base opacities.  Mild cardiomegaly.  Mild central vascular congestion.  No pneumothorax.  No definite pleural effusion.  No acute osseous finding.  IMPRESSION: Interstitial prominence, bibasilar and asymmetric right suprahilar airspace opacities. May reflect pneumonia and/or edema. Allowing for differences in technique, similar to slightly worsened in the interval.  Original Report Authenticated By: Waneta Martins, M.D.   Dg Abd Portable 1v  02/26/2012  *RADIOLOGY REPORT*  Clinical Data: Shortness of breath, chest pain, abdominal pain and pressure  PORTABLE ABDOMEN - 1 VIEW  Comparison: Portable exam 1740 hours without priors for comparison.  Findings: Nonobstructive bowel gas pattern. No bowel dilatation or bowel wall thickening. Scattered gas within large and small bowel loops. Mild endplate spur formation lumbar spine. Osseous demineralization. No urinary tract calcification.  IMPRESSION: Nonobstructive bowel gas pattern.  Original Report Authenticated By: Lollie Marrow, M.D.    Medications: I have reviewed the patient's current medications.  Assessment/Plan:  Principal Problem:  *Sepsis (systemic inflammatory response  syndrome) *Still seems somewhat volume depleted as evidenced by increasing CPK and persistent hyponatremia *We will go ahead and become more aggressive with fluid resuscitation and increase IV fluids to 150 cc per hour *Treat underlying causes which primarily seems to be acute pneumonia process  Active Problems:  Acute respiratory failure with hypoxia/Community acquired pneumonia *Has improved after application of BiPAP so we'll continue for now *suspect viral etiology therefore we'll check flu PCR *We will also check urinary antigens for strep and Legionella *Provide supportive care with with nebulizers *Repeat chest x-ray this morning demonstrates a more diffuse process in the previously seen right middle lobe infiltrate and this process is more consistent with viral pneumonitis   Thrombocytopenia/ Leukopenia with low lymphocyte count *Patient's platelet count was normal in 2007 and the only potentially offending medication is Tegretol- will check a level *It is suspected the hematological disorders are related to Sirs as well as acute infectious process *Cause the platelet count has decreased from 88,000 to  76,000 since admission we will go ahead and stop the Lovenox for DVT prophylaxis in favor of sequential compression device   Dehydration with hyponatremia *Suspect hyponatremic primarily mediated by volume depletion but we'll check a urinary sodium to clarify *Also given severe pulmonary infection can likely have pulmonary mediated hyponatremia   Hypokalemia *Oral repletion and check total panel in the morning  Fever *Improved with treatment of underlying causes   Anemia due to acute illness  *Hemoglobin has dropped 2 g since admission after hydration so we'll continue to follow *No signs of acute bleeding   Abnormal transaminases/ Abdominal pain *Likely related to severity of infection but we'll check a hepatitis panel to rule out acute hepatitis given complaints of  abdominal pain *Pain is primarily generalized in the upper abdominal area and is most likely related to persistent coughing prior to admission *LFTs were normal in 2007- Tegretol can elevate LFT's   Peripheral neuropathy- unspecified *Has been treated as an outpatient with high-dose Neurontin and Tegretol and recently narcotics added *Patient describes burning pain in the feet and tibial regions *States has not been told he has a B12 deficiency and is unclear if this has been evaluated so we will check an anemia panel this admission *Has been evaluated for arterial insufficiency but based on what wife told us sounds like he has venous insufficiency and associated end of day edema in the lower extremities   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   Calvert Cantor, MD [phone]

## 2012-02-27 NOTE — Progress Notes (Signed)
Utilization review completed.  

## 2012-02-27 NOTE — Care Management Note (Signed)
Page 1 of 2   03/05/2012     3:43:08 PM   CARE MANAGEMENT NOTE 03/05/2012  Patient:  James Fletcher, James Fletcher   Account Number:  [id number]  Date Initiated:  02/27/2012  Documentation initiated by:  James Fletcher  Subjective/Objective Assessment:   Pt admitted with SIRS, resp distress- BIPAP     Action/Plan:   PTA pt lived at home with spouse, independent with ADLs   Anticipated DC Date:  03/05/2012   Anticipated DC Plan:  HOME/SELF CARE      DC Planning Services  CM consult      Choice offered to / List presented to:     DME arranged  NEBULIZER/MEDS      DME agency  [facility]        Status of service:  Completed, signed off Medicare Important Message given?   (If response is "NO", the following Medicare IM given date fields will be blank) Date Medicare IM given:   Date Additional Medicare IM given:    Discharge Disposition:  HOME/SELF CARE  Per UR Regulation:  Reviewed for med. necessity/level of care/duration of stay  If discussed at Long Length of Stay Meetings, dates discussed:   03/03/2012    Comments:  03/05/12 Nebulizer ordered for home, spoke iwth patient he is agreeable to getting nebulizer. Contacted [facility] and requested nebulizer be brought to patient's room prior to d/c this pm.James Fletcher James Fletcher, BSN, CCM   03/03/12- 1400- James Pierini RN, BSN [phone] Spoke with pt and wife at bedside- per conversation pt and wife would like to stay here but will tx to [facility] hospital if need to for [place] to cover bill. Pt states that he has never signed up for Medicare and that his VA benefits are his only insurance. Does not want to sign VA paperwork until sure about VA coverage. Call made to James Fletcher at the [facility]- per conversation with James Fletcher pt is fully connected with the [place]- she does not show that pt has Medicare- unsure why?- and states that if pt is going to remain in hospital past another 24-48 hrs then we would need to look at possible tx to the  [facility]- she is to fax other paperwork needed for possible transfer. Also called [facility] admitting- to check on Medicare- per admitting pt has verified Medicare coverage but they do not have pt VA info- informed pt and wife of above- pt's wife to take [place] card down to Admitting to file. Per MD pt may be ready for discharge in the next 24-48 hrs- pt is hopeful that he will not need to tx to the [place]- but willing if he needs to.   03/02/2012  [address] RN, [place] [phone] Telephone call to [facility]:  James Fletcher [phone] ext  [phone] Left voice mail message with update on medical status.  03/02/2012  1500 Darlyne Russian RN, [place] [phone] Met with patient and spouse regarding CM and discharge planning. The patient is followed by the [facility]. The [facility] had [facility] and they planned to drive to the [place] center. He was so sick they did not attempt the trip to [place]. The MD said he is getting better and would be transferred to another unit,the wife said plan to complete his medical treatment here. Advised CM to update VA regarding status. CM to continue to follow for discharge planning.  PCP- [facility]  02/27/12- 1625- James Pierini RN, BSN [phone] Spoke with  pt and son at bedside- per conversation [facility]- and gets his medications from the [place] also. Pt lives at home- plans to return home at discharge. [facility] [place] notified of pt's admission. NCM to follow

## 2012-02-27 NOTE — Progress Notes (Signed)
Patient has gone from 6L to 40% bi-pap and now on a venti-mask today.  Patient has now started having watery stools.  Patient's VSS, and patient is alert an oriented.  Patient is becoming more agitated and is having an increase work of breathing, MD notified and new orders received.  Report given to night RN and will continue to monitor.

## 2012-02-27 NOTE — Progress Notes (Signed)
Patient continues to have increased work of breathing; also coughing.  Robitussin given at 2356 and 0442.  Will continue to monitor closely.    Suzan Slick Troy Sine

## 2012-02-27 NOTE — Progress Notes (Signed)
Patient was removed from Bi-Pap per patient request. While administering breathing treatment patient stated that he wanted to remain off of the Bi-Pap until after lunch. Patient stated mask is uncomfortable after a long period of time. I placed patient on 6L [place] w/water, SpO2 96%, RR 21, HR 90. After ten minutes on [place] it was noted that patient had increased WOB, minimal anxiety, however, patient stated he wanted to wait till after lunch to go back on Bi-Pap.  RT will assess further following patient completing lunch.

## 2012-02-28 DIAGNOSIS — A413 Sepsis due to Hemophilus influenzae: Secondary | ICD-10-CM

## 2012-02-28 DIAGNOSIS — J159 Unspecified bacterial pneumonia: Secondary | ICD-10-CM

## 2012-02-28 DIAGNOSIS — J9600 Acute respiratory failure, unspecified whether with hypoxia or hypercapnia: Secondary | ICD-10-CM

## 2012-02-28 DIAGNOSIS — E871 Hypo-osmolality and hyponatremia: Secondary | ICD-10-CM

## 2012-02-28 LAB — CBC
HCT: 28 % — ABNORMAL LOW (ref 39.0–52.0)
MCH: 31.4 pg (ref 26.0–34.0)
MCV: 87 fL (ref 78.0–100.0)
RBC: 3.22 MIL/uL — ABNORMAL LOW (ref 4.22–5.81)
WBC: 6.6 10*3/uL (ref 4.0–10.5)

## 2012-02-28 LAB — DIFFERENTIAL
Basophils Relative: 1 % (ref 0–1)
Eosinophils Relative: 0 % (ref 0–5)
Lymphs Abs: 1.3 10*3/uL (ref 0.7–4.0)
Monocytes Relative: 14 % — ABNORMAL HIGH (ref 3–12)

## 2012-02-28 LAB — COMPREHENSIVE METABOLIC PANEL
ALT: 128 U/L — ABNORMAL HIGH (ref 0–53)
AST: 166 U/L — ABNORMAL HIGH (ref 0–37)
CO2: 20 mEq/L (ref 19–32)
Chloride: 98 mEq/L (ref 96–112)
GFR calc non Af Amer: >90 mL/min (ref >90)
Sodium: 130 mEq/L — ABNORMAL LOW (ref 135–145)
Total Bilirubin: 1.2 mg/dL (ref 0.3–1.2)

## 2012-02-28 LAB — URINE CULTURE
Colony Count: NO GROWTH
Culture  Setup Time: 201306211822
Culture: NO GROWTH

## 2012-02-28 MED ORDER — SALINE SPRAY 0.65 % NA SOLN
1.0000 | NASAL | Status: DC | PRN
  Filled 2012-02-28: qty 44

## 2012-02-28 MED ORDER — BISMUTH SUBSALICYLATE 262 MG/15ML PO SUSP
30.0000 mL | Freq: Four times a day (QID) | ORAL | Status: DC
  Administered 2012-02-28 – 2012-03-05 (×19): 30 mL via ORAL
  Filled 2012-02-28: qty 236

## 2012-02-28 NOTE — Progress Notes (Signed)
[facility]   Subjective: Still very restless today per wife. Per nurse he is trying to get out of bed. He is having watery stools. On Venti-mask 55%- continues to have congested cough.   Objective: Vital signs in last 24 hours: Temp:  [98.4 F (36.9 C)-101.1 F (38.4 C)] 98.4 F (36.9 C) (06/22 1513) Pulse Rate:  [89-104] 96  (06/22 1224) Resp:  [22-29] 29  (06/22 1224) BP: (128-154)/(50-70) 128/50 mmHg (06/22 1224) SpO2:  [94 %-100 %] 95 % (06/22 1224) FiO2 (%):  [40 %-60 %] 50 % (06/22 1500) Weight change:  Last BM Date: 02/28/12  Intake/Output from previous day: 06/21 0701 - 06/22 0700 In: 3700 [I.V.:3450; IV Piggyback:250] Out: 2050 [Urine:2050] Intake/Output this shift: Total I/O In: 300 [IV Piggyback:300] Out: 100 [Urine:100]  General appearance: alert, cooperative, appears older than stated age and moderate distress Resp: Scattered wheezes with basilar crackles bilaterally Cardio: regular rate and rhythm- S1, S2 normal, no murmur, click GI: soft, non-tender; bowel sounds normal; no masses,  no organomegaly Extremities: extremities normal, atraumatic, no cyanosis or edema Neurologic: Grossly normal  Lab Results:  Basename 02/28/12 0420 02/27/12 0153  WBC 6.6 4.8  HGB 10.1* 10.4*  HCT 28.0* 28.3*  PLT 101* 76*   BMET  Basename 02/28/12 0420 02/27/12 1514  NA 130* 129*  K 3.9 3.4*  CL 98 98  CO2 20 21  GLUCOSE 94 135*  BUN 10 11  CREATININE 0.62 0.67  CALCIUM 7.4* 7.6*    Studies/Results: Dg Chest Port 1 View  02/26/2012  *RADIOLOGY REPORT*  Clinical Data: Pulmonary infiltrate.  Shortness of breath.  Chest pain.  PORTABLE CHEST - 1 VIEW  Comparison: 06/20 and 02/25/2012  Findings: There is a persistent hazy right perihilar infiltrate in the right upper lobe.  There is also persistent peribronchial thickening in the right lower lobe.  Heart size and vascularity are normal.  Left lung is clear except for peribronchial thickening.  IMPRESSION:  Persistent hazy infiltrate in the right upper lobe with bilateral bronchitic changes.  Original Report Authenticated By: Gwynn Burly, M.D.   Dg Abd Portable 1v  02/26/2012  *RADIOLOGY REPORT*  Clinical Data: Shortness of breath, chest pain, abdominal pain and pressure  PORTABLE ABDOMEN - 1 VIEW  Comparison: Portable exam 1740 hours without priors for comparison.  Findings: Nonobstructive bowel gas pattern. No bowel dilatation or bowel wall thickening. Scattered gas within large and small bowel loops. Mild endplate spur formation lumbar spine. Osseous demineralization. No urinary tract calcification.  IMPRESSION: Nonobstructive bowel gas pattern.  Original Report Authenticated By: Lollie Marrow, M.D.    Medications: I have reviewed the patient's current medications.  Assessment/Plan:  Principal Problem:  *Sepsis Due to PNA   Acute respiratory failure with hypoxia/Community acquired pneumonia PRN BiPAP *suspect viral etiology therefore we'll check flu PCR *neg ative strep and Legionella *Repeat chest x-ray demonstrates a more diffuse process in the previously seen right middle lobe infiltrate and this process is more consistent with viral pneumonitis   Thrombocytopenia/ Leukopenia with low lymphocyte count *Patient's platelet count was normal in 2007 and the only potentially offending medication is Tegretol- will check a level * related to Sirs as well as acute infectious process Hold blood thinners   Dehydration with hyponatremia *Suspect hyponatremic primarily mediated by volume depletion  *Also given severe pulmonary infection can likely have pulmonary mediated hyponatremia   Hypokalemia *Oral repletion    Anemia due to acute illness  *No signs of acute bleeding Iron level  low- will eventually need replacement   Abnormal transaminases/ Abdominal pain *Likely related to severity of infection but we'll check a hepatitis panel to rule out acute hepatitis given complaints of  abdominal pain *Pain is primarily generalized in the upper abdominal area and is most likely related to persistent coughing prior to admission *LFTs were normal in 2007- Tegretol can elevate LFT's   Peripheral neuropathy- unspecified *Has been treated as an outpatient with high-dose Neurontin and Tegretol and recently narcotics added *Patient describes burning pain in the feet and tibial regions *B12 normal *Has been evaluated for arterial insufficiency but based on what wife told us sounds like he has venous insufficiency and associated end of day edema in the lower extremities   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

## 2012-02-29 DIAGNOSIS — E871 Hypo-osmolality and hyponatremia: Secondary | ICD-10-CM

## 2012-02-29 DIAGNOSIS — A413 Sepsis due to Hemophilus influenzae: Secondary | ICD-10-CM

## 2012-02-29 DIAGNOSIS — J9600 Acute respiratory failure, unspecified whether with hypoxia or hypercapnia: Secondary | ICD-10-CM

## 2012-02-29 DIAGNOSIS — J159 Unspecified bacterial pneumonia: Secondary | ICD-10-CM

## 2012-02-29 LAB — CBC
HCT: 30.5 % — ABNORMAL LOW (ref 39.0–52.0)
Hemoglobin: 10.6 g/dL — ABNORMAL LOW (ref 13.0–17.0)
MCH: 30.9 pg (ref 26.0–34.0)
MCHC: 34.8 g/dL (ref 30.0–36.0)
MCV: 88.9 fL (ref 78.0–100.0)

## 2012-02-29 LAB — LEGIONELLA ANTIGEN, URINE: Legionella Antigen, Urine: NEGATIVE

## 2012-02-29 LAB — BASIC METABOLIC PANEL
BUN: 8 mg/dL (ref 6–23)
Calcium: 7.7 mg/dL — ABNORMAL LOW (ref 8.4–10.5)
GFR calc non Af Amer: >90 mL/min (ref >90)
Glucose, Bld: 107 mg/dL — ABNORMAL HIGH (ref 70–99)

## 2012-02-29 MED ORDER — BENZONATATE 100 MG PO CAPS
100.0000 mg | ORAL_CAPSULE | ORAL | Status: DC | PRN
  Administered 2012-02-29 – 2012-03-04 (×4): 100 mg via ORAL
  Filled 2012-02-29 (×2): qty 1

## 2012-02-29 MED ORDER — LEVALBUTEROL HCL 0.63 MG/3ML IN NEBU
0.6300 mg | INHALATION_SOLUTION | Freq: Four times a day (QID) | RESPIRATORY_TRACT | Status: DC
  Administered 2012-03-01: 0.63 mg via RESPIRATORY_TRACT
  Filled 2012-02-29 (×2): qty 3

## 2012-02-29 MED ORDER — POTASSIUM CHLORIDE CRYS ER 20 MEQ PO TBCR
40.0000 meq | EXTENDED_RELEASE_TABLET | Freq: Once | ORAL | Status: AC
  Administered 2012-02-29: 40 meq via ORAL
  Filled 2012-02-29: qty 2

## 2012-02-29 MED ORDER — GUAIFENESIN-DM 100-10 MG/5ML PO SYRP
5.0000 mL | ORAL_SOLUTION | Freq: Four times a day (QID) | ORAL | Status: DC
  Administered 2012-02-29 – 2012-03-05 (×19): 5 mL via ORAL
  Filled 2012-02-29 (×23): qty 5

## 2012-02-29 MED ORDER — ENOXAPARIN SODIUM 40 MG/0.4ML ~~LOC~~ SOLN
40.0000 mg | Freq: Every day | SUBCUTANEOUS | Status: DC
  Administered 2012-02-29 – 2012-03-03 (×4): 40 mg via SUBCUTANEOUS
  Filled 2012-02-29 (×6): qty 0.4

## 2012-02-29 MED ORDER — BENZONATATE 100 MG PO CAPS
100.0000 mg | ORAL_CAPSULE | Freq: Three times a day (TID) | ORAL | Status: DC | PRN
  Filled 2012-02-29 (×2): qty 1

## 2012-02-29 MED ORDER — LEVALBUTEROL HCL 1.25 MG/0.5ML IN NEBU
1.2500 mg | INHALATION_SOLUTION | Freq: Three times a day (TID) | RESPIRATORY_TRACT | Status: DC | PRN
  Administered 2012-02-29 – 2012-03-03 (×2): 1.25 mg via RESPIRATORY_TRACT
  Filled 2012-02-29 (×2): qty 0.5

## 2012-02-29 NOTE — Progress Notes (Signed)
[facility]   Subjective: Less restless today. Severe coughing last night with poor sleep. Not much sputum. No chest pain.    Objective: Vital signs in last 24 hours: Temp:  [97.7 F (36.5 C)-99.6 F (37.6 C)] 98.1 F (36.7 C) (06/23 1148) Pulse Rate:  [95-117] 111  (06/23 1148) Resp:  [22-29] 28  (06/23 1148) BP: (125-143)/(53-72) 139/70 mmHg (06/23 1148) SpO2:  [88 %-98 %] 97 % (06/23 1203) FiO2 (%):  [50 %] 50 % (06/23 1203) Weight:  [107.9 kg (237 lb 14 oz)] 107.9 kg (237 lb 14 oz) (06/23 0456) Weight change:  Last BM Date: 02/28/12  Intake/Output from previous day: 06/22 0701 - 06/23 0700 In: 4070 [P.O.:120; I.V.:3600; IV Piggyback:350] Out: 1300 [Urine:1300] Intake/Output this shift: Total I/O In: 1530 [P.O.:480; I.V.:1050] Out: 800 [Urine:800]  General appearance: alert, cooperative, mild distress Resp: Scattered wheezes with basilar crackles bilaterally- still breathing quite fast at rest- on 6 L [place]- pulse ox 95% Cardio: regular rate and rhythm- tachycardic- S1, S2 normal, no murmur, click GI: soft, non-tender; bowel sounds normal; no masses,  no organomegaly Extremities: extremities normal, atraumatic, no cyanosis or edema Neurologic: Grossly normal  Lab Results:  Basename 02/29/12 0828 02/28/12 0420  WBC 11.1* 6.6  HGB 10.6* 10.1*  HCT 30.5* 28.0*  PLT 171 101*   BMET  Basename 02/29/12 0828 02/28/12 0420  NA 135 130*  K 3.4* 3.9  CL 101 98  CO2 25 20  GLUCOSE 107* 94  BUN 8 10  CREATININE 0.63 0.62  CALCIUM 7.7* 7.4*    Studies/Results: No results found.  Medications: I have reviewed the patient's current medications.  Assessment/Plan:  Principal Problem:  *Sepsis Due to PNA   Acute respiratory failure with hypoxia/Community acquired pneumonia REquired BiPAP but now much improved and on NS *negative strep and Legionella and Influenza *Repeat chest x-ray demonstrates a more diffuse process than previously seen right middle  lobe infiltrate - possible viral etiology.    Thrombocytopenia/ Leukopenia with low lymphocyte count *Patient's platelet count was normal in 2007  * related to Sepsis  -improved therefore will resume blood thinner- Lovenox for DVT prophylaxis   Dehydration with hyponatremia *Suspect hyponatremia primarily mediated by volume depletion  *Also given severe pulmonary infection can likely have pulmonary mediated hyponatremia   Hypokalemia *Oral repletion as needed   Anemia - iron deficiency   *No signs of acute bleeding Iron level low- will eventually need replacement   Abnormal transaminases/ Abdominal pain *Likely related to severity of infection but we'll check a hepatitis panel to rule out acute hepatitis given complaints of abdominal pain *Pain is primarily generalized in the upper abdominal area and is most likely related to persistent coughing prior to admission *LFTs were normal in 2007- Tegretol can elevate LFT's   Peripheral neuropathy- unspecified *Has been treated as an outpatient with high-dose Neurontin and Tegretol - recently narcotics added *Patient describes burning pain in the feet and tibial regions *B12 normal *Has been evaluated for arterial insufficiency but based on what wife told us sounds like he has venous insufficiency and associated end of day edema in the lower extremities   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 

## 2012-02-29 NOTE — Progress Notes (Signed)
ANTIBIOTIC CONSULT NOTE - Follow up  Pharmacy Consult for vancomycin and cefepime Indication: rule out pneumonia  No Known Allergies  Patient Measurements: Height: 5' 11.5" (181.6 cm) Weight: 237 lb 14 oz (107.9 kg) IBW/kg (Calculated) : 76.45  Adjusted Body Weight: 107.5 kg  Vital Signs: Temp: 98.1 F (36.7 C) (06/23 1148) Temp src: Oral (06/23 1148) BP: 143/72 mmHg (06/23 0706) Pulse Rate: 117  (06/23 0706) Intake/Output from previous day: 06/22 0701 - 06/23 0700 In: 4070 [P.O.:120; I.V.:3600; IV Piggyback:350] Out: 1300 [Urine:1300] Intake/Output from this shift: Total I/O In: 150 [I.V.:150] Out: 500 [Urine:500]  Labs:  [facility] 02/29/12 0828 02/28/12 0420 02/27/12 1514 02/27/12 0153  WBC 11.1* 6.6 -- 4.8  HGB 10.6* 10.1* -- 10.4*  PLT 171 101* -- 76*  LABCREA -- -- -- --  CREATININE 0.63 0.62 0.67 --   Estimated Creatinine Clearance: 116 ml/min (by C-G formula based on Cr of 0.63). No results found for this basename: VANCOTROUGH:2,VANCOPEAK:2,VANCORANDOM:2,GENTTROUGH:2,GENTPEAK:2,GENTRANDOM:2,TOBRATROUGH:2,TOBRAPEAK:2,TOBRARND:2,AMIKACINPEAK:2,AMIKACINTROU:2,AMIKACIN:2, in the last 72 hours   Microbiology: Recent Results (from the past 720 hour(s))  CULTURE, BLOOD (ROUTINE X 2)     Status: Normal (Preliminary result)   Collection Time   02/26/12  5:50 PM      Component Value Range Status Comment   Specimen Description BLOOD HAND LEFT   Final    Special Requests BOTTLES DRAWN AEROBIC AND ANAEROBIC 10CC   Final    Culture  Setup Time [id number]   Final    Culture     Final    Value:        BLOOD CULTURE RECEIVED NO GROWTH TO DATE CULTURE WILL BE HELD FOR 5 DAYS BEFORE ISSUING A FINAL NEGATIVE REPORT   Report Status PENDING   Incomplete   CULTURE, BLOOD (ROUTINE X 2)     Status: Normal (Preliminary result)   Collection Time   02/26/12  5:55 PM      Component Value Range Status Comment   Specimen Description BLOOD HAND LEFT   Final    Special Requests BOTTLES  DRAWN AEROBIC ONLY 5CC   Final    Culture  Setup Time [id number]   Final    Culture     Final    Value:        BLOOD CULTURE RECEIVED NO GROWTH TO DATE CULTURE WILL BE HELD FOR 5 DAYS BEFORE ISSUING A FINAL NEGATIVE REPORT   Report Status PENDING   Incomplete   MRSA PCR SCREENING     Status: Normal   Collection Time   02/26/12  6:17 PM      Component Value Range Status Comment   MRSA by PCR NEGATIVE  NEGATIVE Final   CULTURE, EXPECTORATED SPUTUM-ASSESSMENT     Status: Normal   Collection Time   02/27/12  8:43 AM      Component Value Range Status Comment   Specimen Description SPUTUM   Final    Special Requests NONE   Final    Sputum evaluation     Final    Value: MICROSCOPIC FINDINGS SUGGEST THAT THIS SPECIMEN IS NOT REPRESENTATIVE OF LOWER RESPIRATORY SECRETIONS. PLEASE RECOLLECT.     CALLED TO B BRADT, RN 02/27/12 1000 BY K SCHULTZ   Report Status 02/27/2012 FINAL   Final   URINE CULTURE     Status: Normal   Collection Time   02/27/12 11:31 AM      Component Value Range Status Comment   Specimen Description URINE, CLEAN CATCH   Final    Special Requests  NONE   Final    Culture  Setup Time [id number]   Final    Colony Count NO GROWTH   Final    Culture NO GROWTH   Final    Report Status 02/28/2012 FINAL   Final   CLOSTRIDIUM DIFFICILE BY PCR     Status: Normal   Collection Time   02/27/12  7:01 PM      Component Value Range Status Comment   C difficile by pcr NEGATIVE  NEGATIVE Final     Medical History: Past Medical History  Diagnosis Date  . Peripheral neuropathy   . Neuropathy 02/26/2012  . Left bundle-branch block 02/26/2012    Medications:  Scheduled:     . albuterol  2.5 mg Nebulization Q4H   And  . ipratropium  0.5 mg Nebulization Q4H  . aspirin  81 mg Oral Daily  . azithromycin  500 mg Intravenous Q24H  . bismuth subsalicylate  30 mL Oral Q6H  . carbamazepine  400 mg Oral BID  . ceFEPime (MAXIPIME) IV  2 g Intravenous Q12H  . gabapentin  1,200 mg Oral  TID  . guaiFENesin-dextromethorphan  5 mL Oral Q6H  . pantoprazole  40 mg Oral Q supper  . senna  1 tablet Oral BID  . sodium chloride  3 mL Intravenous Q12H  . vancomycin  1,000 mg Intravenous Q12H   Infusions:     . sodium chloride 150 mL/hr at 02/29/12 1350   Assessment: 66 yo male with CAP/sepsis continues on day #3 Vancomycin and cefepime.  SCr 0.63 is stable, estimated CrCl > 100 ml/min. Afebrile, WBC increased to 11.1K.  Urine culture negative. 6/20 blood cultures x 2 NGTD.  Goal of Therapy:  Vancomycin trough level 15-20 mcg/ml  Plan:  Continue Cefepime 2g IV q12h and Vancomycin 1g IV q12h. Check vancomycin trough in AM 6/24.  Noah Delaine, RPh Clinical Pharmacist 02/29/2012,2:15 PM

## 2012-02-29 NOTE — Progress Notes (Signed)
Pt continues to cough  After robitussin given, NP paged new orders given will continue to monitor

## 2012-03-01 DIAGNOSIS — J9600 Acute respiratory failure, unspecified whether with hypoxia or hypercapnia: Secondary | ICD-10-CM

## 2012-03-01 DIAGNOSIS — A413 Sepsis due to Hemophilus influenzae: Secondary | ICD-10-CM

## 2012-03-01 DIAGNOSIS — J159 Unspecified bacterial pneumonia: Secondary | ICD-10-CM

## 2012-03-01 DIAGNOSIS — E871 Hypo-osmolality and hyponatremia: Secondary | ICD-10-CM

## 2012-03-01 LAB — HEPATITIS PANEL, ACUTE
HCV Ab: NEGATIVE
Hep A IgM: NEGATIVE
Hep B C IgM: NEGATIVE
Hepatitis B Surface Ag: NEGATIVE

## 2012-03-01 LAB — CBC
HCT: 27.7 % — ABNORMAL LOW (ref 39.0–52.0)
MCHC: 34.3 g/dL (ref 30.0–36.0)
RDW: 13.5 % (ref 11.5–15.5)

## 2012-03-01 LAB — BASIC METABOLIC PANEL
BUN: 5 mg/dL — ABNORMAL LOW (ref 6–23)
Chloride: 100 mEq/L (ref 96–112)
Creatinine, Ser: 0.55 mg/dL (ref 0.50–1.35)
GFR calc Af Amer: >90 mL/min (ref >90)
GFR calc non Af Amer: >90 mL/min (ref >90)

## 2012-03-01 MED ORDER — VANCOMYCIN HCL IN DEXTROSE 1-5 GM/200ML-% IV SOLN
1000.0000 mg | Freq: Three times a day (TID) | INTRAVENOUS | Status: DC
  Administered 2012-03-01 – 2012-03-05 (×11): 1000 mg via INTRAVENOUS
  Filled 2012-03-01 (×13): qty 200

## 2012-03-01 MED ORDER — POTASSIUM CHLORIDE CRYS ER 20 MEQ PO TBCR
40.0000 meq | EXTENDED_RELEASE_TABLET | Freq: Two times a day (BID) | ORAL | Status: AC
  Administered 2012-03-01 (×2): 40 meq via ORAL
  Filled 2012-03-01 (×2): qty 2

## 2012-03-01 MED ORDER — IPRATROPIUM BROMIDE 0.02 % IN SOLN
0.5000 mg | Freq: Four times a day (QID) | RESPIRATORY_TRACT | Status: DC
  Administered 2012-03-01 – 2012-03-02 (×5): 0.5 mg via RESPIRATORY_TRACT
  Filled 2012-03-01 (×6): qty 2.5

## 2012-03-01 MED ORDER — LEVALBUTEROL HCL 0.63 MG/3ML IN NEBU
0.6300 mg | INHALATION_SOLUTION | Freq: Four times a day (QID) | RESPIRATORY_TRACT | Status: DC
  Administered 2012-03-01 – 2012-03-02 (×7): 0.63 mg via RESPIRATORY_TRACT
  Filled 2012-03-01 (×9): qty 3

## 2012-03-01 NOTE — Progress Notes (Signed)
[facility] PROGRESS NOTE  James Fletcher ZOX:[id number] DOB: December 07, 1945 DOA: 02/26/2012 PCP: No primary provider on file.  Brief narrative: 66yo male with pmh of LBB and peripheral neuropathy presented to ED on 6/20 with complaints of fever, cough, shortness of breath and abdominal pain x 4-5 days. He was seen initially at [facility] on 6/19 and found to have probable pneumonia and leukopenia. He decided at that time to sign-out AMA. He returned to the ED again on 6/20 with worsening symptoms with associated weakness and fatigue. He described orthopnea. He was referred to [facility] for admission. Enroute to [facility], pt began to have more oxygen requirement leading to admission to SDU. He required BIPAP on 6/23, but is now maintaining O2 sats on O2 via [place].   Consultants:  None   Procedures:  None   Antibiotics:  Vancomycin 6/20>>  Cefepime 6/20>>  Azith 6/20>>  HPI/Subjective: Maybe feeling "a little" better, but feeling like he is unable to cough up thick sputum. Watery stools had improved since Saturday, but 2 episodes this am. Denies any chest pain or abdominal pain. Tolerating regular diet.  Objective: Filed Vitals:   03/01/12 0750 03/01/12 0751 03/01/12 0932 03/01/12 1137  BP:  120/60  147/63  Pulse: 104 103  98  Temp: 98.6 F (37 C)   98.1 F (36.7 C)  TempSrc: Oral   Oral  Resp: 24 22  23   Height:      Weight:      SpO2:  94% 96% 94%    Intake/Output Summary (Last 24 hours) at 03/01/12 1402 Last data filed at 03/01/12 1137  Gross per 24 hour  Intake   2793 ml  Output   1075 ml  Net   1718 ml    Exam:   General:  Weak appearing, disheveled but in NAD  Cardiovascular: S1S2 RRR,no m/r/g, no LE edema  Respiratory: scant rhonchi, no wheeze, able to speak in full sentences without worsening sob  Abdomen: obese, soft, NT/ND, BS+  Neuro: no focal m/s deficits on exam  Psych: aox3, normal mood and affect  Data Reviewed: Basic Metabolic Panel:  Lab  03/01/12 0343 02/29/12 0828 02/28/12 0420 02/27/12 1514 02/27/12 0153  NA 133* 135 130* 129* 125*  K 3.3* 3.4* 3.9 3.4* 3.0*  CL 100 101 98 98 92*  CO2 24 25 20 21 22   GLUCOSE 90 107* 94 135* 104*  BUN 5* 8 10 11 12   CREATININE 0.55 0.63 0.62 0.67 0.67  CALCIUM 7.6* 7.7* 7.4* 7.6* 7.3*  MG -- -- -- -- --  PHOS -- -- -- -- --   Liver Function Tests:  Lab 02/28/12 0420 02/27/12 0153 02/25/12 2131  AST 166* 144* 106*  ALT 128* 101* 89*  ALKPHOS 162* 162* 196*  BILITOT 1.2 1.9* 0.9  PROT 5.1* 5.2* 6.0  ALBUMIN 2.6* 2.8* 3.4*    Lab 02/25/12 2257  AMMONIA 59   CBC:  Lab 03/01/12 0343 02/29/12 0828 02/28/12 0420 02/27/12 0153 02/26/12 1830 02/26/12 1337 02/25/12 2131  WBC 11.3* 11.1* 6.6 4.8 3.7* -- --  NEUTROABS -- -- 4.3 -- -- 3.9 1.9  HGB 9.5* 10.6* 10.1* 10.4* 10.5* -- --  HCT 27.7* 30.5* 28.0* 28.3* 29.4* -- --  MCV 88.5 88.9 87.0 85.2 85.0 -- --  PLT 204 171 101* 76* 88* -- --   Cardiac Enzymes:  Lab 02/28/12 0859 02/27/12 1010 02/27/12 0153 02/26/12 1808  CKTOTAL 593* 1230* 1379* 1100*  CKMB -- 14.9* 13.4* 8.4*  CKMBINDEX -- -- -- --  TROPONINI -- <0.30 <0.30 <0.30   CBG:  Lab 02/27/12 0430 02/26/12 2001  GLUCAP 111* 137*    Recent Results (from the past 240 hour(s))  CULTURE, BLOOD (ROUTINE X 2)     Status: Normal (Preliminary result)   Collection Time   02/26/12  5:50 PM      Component Value Range Status Comment   Specimen Description BLOOD HAND LEFT   Final    Special Requests BOTTLES DRAWN AEROBIC AND ANAEROBIC 10CC   Final    Culture  Setup Time [id number]   Final    Culture     Final    Value:        BLOOD CULTURE RECEIVED NO GROWTH TO DATE CULTURE WILL BE HELD FOR 5 DAYS BEFORE ISSUING A FINAL NEGATIVE REPORT   Report Status PENDING   Incomplete   CULTURE, BLOOD (ROUTINE X 2)     Status: Normal (Preliminary result)   Collection Time   02/26/12  5:55 PM      Component Value Range Status Comment   Specimen Description BLOOD HAND LEFT   Final     Special Requests BOTTLES DRAWN AEROBIC ONLY 5CC   Final    Culture  Setup Time [id number]   Final    Culture     Final    Value:        BLOOD CULTURE RECEIVED NO GROWTH TO DATE CULTURE WILL BE HELD FOR 5 DAYS BEFORE ISSUING A FINAL NEGATIVE REPORT   Report Status PENDING   Incomplete   MRSA PCR SCREENING     Status: Normal   Collection Time   02/26/12  6:17 PM      Component Value Range Status Comment   MRSA by PCR NEGATIVE  NEGATIVE Final   CULTURE, EXPECTORATED SPUTUM-ASSESSMENT     Status: Normal   Collection Time   02/27/12  8:43 AM      Component Value Range Status Comment   Specimen Description SPUTUM   Final    Special Requests NONE   Final    Sputum evaluation     Final    Value: MICROSCOPIC FINDINGS SUGGEST THAT THIS SPECIMEN IS NOT REPRESENTATIVE OF LOWER RESPIRATORY SECRETIONS. PLEASE RECOLLECT.     CALLED TO B BRADT, RN 02/27/12 1000 BY K SCHULTZ   Report Status 02/27/2012 FINAL   Final   URINE CULTURE     Status: Normal   Collection Time   02/27/12 11:31 AM      Component Value Range Status Comment   Specimen Description URINE, CLEAN CATCH   Final    Special Requests NONE   Final    Culture  Setup Time [id number]   Final    Colony Count NO GROWTH   Final    Culture NO GROWTH   Final    Report Status 02/28/2012 FINAL   Final   CLOSTRIDIUM DIFFICILE BY PCR     Status: Normal   Collection Time   02/27/12  7:01 PM      Component Value Range Status Comment   C difficile by pcr NEGATIVE  NEGATIVE Final     Assessment/Plan:  1. SIRS: Now resolved. Likely related to CAP and with UTI. Will continue broad-spectrum antibiotics for now as WBC slightly increased today and with continued sob. Urine legionella and strep were negative. Blood cultures x 2 obtained 6/20 are negative to date. Pt has remained afebrile since admit. Consider narrowing antibiotics in am.   2. Acute hypoxic respiratory failure secondary  to CAP: Requiring BIPAP on 6/23, now stable to O2 via [place]. Negative  strep and legionella. Repeat CXR done 6/20 consistent with pna. No evidence of excess volume on films though diffuse hypokinesis on 2decho with EF of 45-50%. Will ask RT to assess appropriateness for flutter valve. Continue scheduled nebs.   3. Watery stool:  cdiff negative. Discontinue senna tabs. Monitor.  4. Hypokalemia: Secondary to above. Replete po. Monitor.   5. Transaminitis: ? Secondary to acute illness +/- medication effect with tegretol. Abdominal ultrasound done 6/20 unremarkable. Will recheck in am to monitor trend. Pt tolerating diet, denies n/v, abdominal pain. Final hepatitis panel pending  6. Thrombocytopenia/Leukopenia: plt count normalized. Continue to monitor. He now actually has some mild leukocytosis, but is otherwise improving. Monitor trend.  7. Chronic systolic HF: Per 2Decho done this admit. EF 45-50% with diffuse hypokinesis. Will SLIV as pt is currently tolerating diet ok. Monitor volume status very closely.   8. Rhabdomyolysis: Felt secondary to dehydration. CK 593 on 6/22 from 1379 on 6/21.   9. LBBB: Chronic since 2007. Normal cardiac cath at that time. ACS ruled out this admit.  10. Peripheral neuropathy: Has been treated as an outpatient with high-dose Neurontin and Tegretol - recently narcotics added. Patient describes burning pain in the feet and tibial regions. Has been evaluated for arterial insufficiency but based on what wife told us sounds like he has venous insufficiency and associated end of day edema in the lower extremities. B12 normal. Tegretol level wnl.    Code Status: full code Family Communication: finding and plan of care discussed with pt. No family at time of exam. Disposition Plan: continue to monitor overnight in SDU.    Cordelia Pen, NP-C [facility] Service [facility] System  pgr [phone]   If 7PM-7AM, please contact night-coverage [email or web address] Password [email or web address] 03/01/2012, 2:02 PM   LOS: 4 days   I have  personally examined this patient and reviewed the entire database. I have reviewed the above note, made any necessary editorial changes, and agree with its content.  Lonia Blood, MD [facility]

## 2012-03-01 NOTE — Progress Notes (Signed)
ANTIBIOTIC CONSULT NOTE - FOLLOW UP  Pharmacy Consult for vancomycin Indication: rule out pneumonia  No Known Allergies  Patient Measurements: Height: 5' 11.5" (181.6 cm) Weight: 241 lb 10 oz (109.6 kg) IBW/kg (Calculated) : 76.45   Vital Signs: Temp: 98.6 F (37 C) (06/24 0750) Temp src: Oral (06/24 0750) BP: 120/60 mmHg (06/24 0751) Pulse Rate: 103  (06/24 0751) Intake/Output from previous day: 06/23 0701 - 06/24 0700 In: 3580 [P.O.:480; I.V.:3100] Out: 1650 [Urine:1650] Intake/Output from this shift: Total I/O In: 128 [I.V.:128] Out: -   Labs:  Basename 03/01/12 0343 02/29/12 0828 02/28/12 0420  WBC 11.3* 11.1* 6.6  HGB 9.5* 10.6* 10.1*  PLT 204 171 101*  LABCREA -- -- --  CREATININE 0.55 0.63 0.62   Estimated Creatinine Clearance: 116.8 ml/min (by C-G formula based on Cr of 0.55).  Basename 03/01/12 0837  VANCOTROUGH 6.8*  VANCOPEAK --  Drue Dun --  GENTTROUGH --  GENTPEAK --  GENTRANDOM --  TOBRATROUGH --  TOBRAPEAK --  TOBRARND --  AMIKACINPEAK --  AMIKACINTROU --  AMIKACIN --     Microbiology: Recent Results (from the past 720 hour(s))  CULTURE, BLOOD (ROUTINE X 2)     Status: Normal (Preliminary result)   Collection Time   02/26/12  5:50 PM      Component Value Range Status Comment   Specimen Description BLOOD HAND LEFT   Final    Special Requests BOTTLES DRAWN AEROBIC AND ANAEROBIC 10CC   Final    Culture  Setup Time [id number]   Final    Culture     Final    Value:        BLOOD CULTURE RECEIVED NO GROWTH TO DATE CULTURE WILL BE HELD FOR 5 DAYS BEFORE ISSUING A FINAL NEGATIVE REPORT   Report Status PENDING   Incomplete   CULTURE, BLOOD (ROUTINE X 2)     Status: Normal (Preliminary result)   Collection Time   02/26/12  5:55 PM      Component Value Range Status Comment   Specimen Description BLOOD HAND LEFT   Final    Special Requests BOTTLES DRAWN AEROBIC ONLY 5CC   Final    Culture  Setup Time [id number]   Final    Culture      Final    Value:        BLOOD CULTURE RECEIVED NO GROWTH TO DATE CULTURE WILL BE HELD FOR 5 DAYS BEFORE ISSUING A FINAL NEGATIVE REPORT   Report Status PENDING   Incomplete   MRSA PCR SCREENING     Status: Normal   Collection Time   02/26/12  6:17 PM      Component Value Range Status Comment   MRSA by PCR NEGATIVE  NEGATIVE Final   CULTURE, EXPECTORATED SPUTUM-ASSESSMENT     Status: Normal   Collection Time   02/27/12  8:43 AM      Component Value Range Status Comment   Specimen Description SPUTUM   Final    Special Requests NONE   Final    Sputum evaluation     Final    Value: MICROSCOPIC FINDINGS SUGGEST THAT THIS SPECIMEN IS NOT REPRESENTATIVE OF LOWER RESPIRATORY SECRETIONS. PLEASE RECOLLECT.     CALLED TO B BRADT, RN 02/27/12 1000 BY K SCHULTZ   Report Status 02/27/2012 FINAL   Final   URINE CULTURE     Status: Normal   Collection Time   02/27/12 11:31 AM      Component Value Range Status Comment  Specimen Description URINE, CLEAN CATCH   Final    Special Requests NONE   Final    Culture  Setup Time [id number]   Final    Colony Count NO GROWTH   Final    Culture NO GROWTH   Final    Report Status 02/28/2012 FINAL   Final   CLOSTRIDIUM DIFFICILE BY PCR     Status: Normal   Collection Time   02/27/12  7:01 PM      Component Value Range Status Comment   C difficile by pcr NEGATIVE  NEGATIVE Final     Anti-infectives     Start     Dose/Rate Route Frequency Ordered Stop   03/01/12 1800   vancomycin (VANCOCIN) IVPB 1000 mg/200 mL premix        1,000 mg 200 mL/hr over 60 Minutes Intravenous Every 8 hours 03/01/12 1021     02/27/12 0800   vancomycin (VANCOCIN) IVPB 1000 mg/200 mL premix  Status:  Discontinued        1,000 mg 200 mL/hr over 60 Minutes Intravenous Every 12 hours 02/26/12 1838 03/01/12 1021   02/26/12 2000   vancomycin (VANCOCIN) 2,000 mg in sodium chloride 0.9 % 500 mL IVPB        2,000 mg 250 mL/hr over 120 Minutes Intravenous  Once 02/26/12 1838 02/26/12  2346   02/26/12 1930   ceFEPIme (MAXIPIME) 2 g in dextrose 5 % 50 mL IVPB        2 g 100 mL/hr over 30 Minutes Intravenous Every 12 hours 02/26/12 1838     02/26/12 1900   azithromycin (ZITHROMAX) 500 mg in dextrose 5 % 250 mL IVPB        500 mg 250 mL/hr over 60 Minutes Intravenous Every 24 hours 02/26/12 1809     02/26/12 1500   moxifloxacin (AVELOX) IVPB 400 mg        400 mg 250 mL/hr over 60 Minutes Intravenous  Once 02/26/12 1455 02/26/12 1625          Assessment: 65 yom started on empiric vancomycin + cefepime + azithromycin.  Today is day #5 of antibiotic therapy. Pt is afebrile today and WBC is very slightly elevated at 11.3. All cultures (urine, blood, cdiff PCR, sputum, Flu PCR) are negative to date. Vanc trough today is 6.8 which is below goal. Vancomycin trough was drawn ~1 hour late so real trough is actually a bit higher.   Goal of Therapy:  Vancomycin trough level 15-20 mcg/ml  Plan:  1. Increase vancomycin to 1gm IV Q8H 2. F/u trough at [facility] if continued 3. Clarify LOT  Ama Mcmaster, Drake Leach 03/01/2012,10:22 AM

## 2012-03-02 ENCOUNTER — Inpatient Hospital Stay (HOSPITAL_COMMUNITY): Payer: Medicare Other

## 2012-03-02 DIAGNOSIS — A413 Sepsis due to Hemophilus influenzae: Secondary | ICD-10-CM

## 2012-03-02 DIAGNOSIS — J159 Unspecified bacterial pneumonia: Secondary | ICD-10-CM

## 2012-03-02 DIAGNOSIS — J9600 Acute respiratory failure, unspecified whether with hypoxia or hypercapnia: Secondary | ICD-10-CM

## 2012-03-02 DIAGNOSIS — E871 Hypo-osmolality and hyponatremia: Secondary | ICD-10-CM

## 2012-03-02 LAB — CBC
MCHC: 34.9 g/dL (ref 30.0–36.0)
MCV: 89.3 fL (ref 78.0–100.0)
Platelets: 261 10*3/uL (ref 150–400)
RDW: 13.5 % (ref 11.5–15.5)
WBC: 8.8 10*3/uL (ref 4.0–10.5)

## 2012-03-02 LAB — COMPREHENSIVE METABOLIC PANEL
AST: 63 U/L — ABNORMAL HIGH (ref 0–37)
Albumin: 2.3 g/dL — ABNORMAL LOW (ref 3.5–5.2)
Calcium: 7.9 mg/dL — ABNORMAL LOW (ref 8.4–10.5)
Chloride: 102 mEq/L (ref 96–112)
Creatinine, Ser: 0.48 mg/dL — ABNORMAL LOW (ref 0.50–1.35)
Total Bilirubin: 0.8 mg/dL (ref 0.3–1.2)
Total Protein: 5.3 g/dL — ABNORMAL LOW (ref 6.0–8.3)

## 2012-03-02 MED ORDER — LEVALBUTEROL HCL 0.63 MG/3ML IN NEBU
0.6300 mg | INHALATION_SOLUTION | Freq: Two times a day (BID) | RESPIRATORY_TRACT | Status: DC
  Administered 2012-03-03: 0.63 mg via RESPIRATORY_TRACT
  Filled 2012-03-02 (×3): qty 3

## 2012-03-02 MED ORDER — IPRATROPIUM BROMIDE 0.02 % IN SOLN
0.5000 mg | Freq: Two times a day (BID) | RESPIRATORY_TRACT | Status: DC
  Administered 2012-03-03: 0.5 mg via RESPIRATORY_TRACT
  Filled 2012-03-02: qty 2.5

## 2012-03-02 NOTE — Progress Notes (Signed)
Pt c/o SOB, decrease in O2 sats to 92% and increase slight increase in RR and work of breathing.  O2 increased to 5LNC, sats back to baseline.  1mg  Ativan given as ordered.  Dr Butler Denmark in to assess pt.

## 2012-03-02 NOTE — Progress Notes (Deleted)
Pt returned from Endo, VSS, family at bedside, c/o pain in hip - pain management and safety discussed.  Family and patient verbalize understanding

## 2012-03-02 NOTE — Progress Notes (Signed)
   CARE MANAGEMENT NOTE 03/02/2012  Patient:  James Fletcher, James Fletcher   Account Number:  [id number]  Date Initiated:  02/27/2012  Documentation initiated by:  Donn Pierini  Subjective/Objective Assessment:   Pt admitted with SIRS, resp distress- BIPAP     Action/Plan:   PTA pt lived at home with spouse, independent with ADLs   Anticipated DC Date:  03/02/2012   Anticipated DC Plan:  HOME/SELF CARE      DC Planning Services  CM consult      Choice offered to / List presented to:             Status of service:  In process, will continue to follow Medicare Important Message given?   (If response is "NO", the following Medicare IM given date fields will be blank) Date Medicare IM given:   Date Additional Medicare IM given:    Discharge Disposition:    Per UR Regulation:  Reviewed for med. necessity/level of care/duration of stay  If discussed at Long Length of Stay Meetings, dates discussed:    Comments:  03/02/2012  [address] RN, [place] [phone] Telephone call to [facility]:  [name] [phone] ext  [phone]  Left voice mail message with update on medical status.  03/02/2012  1500 Darlyne Russian RN, [place] [phone] Met with patient and spouse regarding CM and discharge planning. The patient is followed by the [facility]. The patient had been seen at the [facility] and they planned to drive to the [place] center. He was so sick they did not attempt the trip to [place]. The MD said he is getting better and would be transferred to another unit,the wife said plan to complete his medical treatment here. Advised CM to update [facility] regarding status. CM to continue to follow for discharge planning.  PCP- [facility]  02/27/12- 1625- Donn Pierini RN, BSN [phone] Spoke with pt and son at bedside- per conversation pt has PCP at the [facility]- and gets his medications from the [place] also. Pt lives at home- plans to return home at discharge. [facility] [place] notified of pt's admission. NCM to follow

## 2012-03-02 NOTE — Progress Notes (Signed)
[facility]  66yo male with pmh of LBB and peripheral neuropathy presented to ED on 6/20 with complaints of fever, cough, shortness of breath and abdominal pain x 4-5 days. He was seen initially at [facility] on 6/19 and found to have probable pneumonia and leukopenia. He decided at that time to sign-out AMA. He returned to the ED again on 6/20 with worsening symptoms with associated weakness and fatigue. He described orthopnea. He was referred to [facility] for admission. Enroute to [facility], pt began to have more oxygen requirement leading to admission to SDU. He required BIPAP on 6/23, but is now maintaining O2 sats on O2 via [place].     Subjective: Less restless today. Severe coughing last night with poor sleep. Not much sputum. No chest pain.    Objective: Vital signs in last 24 hours: Temp:  [97.9 F (36.6 C)-98.8 F (37.1 C)] 98.6 F (37 C) (06/25 0700) Pulse Rate:  [86-99] 86  (06/25 0415) Resp:  [17-24] 21  (06/25 0415) BP: (130-147)/(60-71) 130/60 mmHg (06/25 0800) SpO2:  [92 %-99 %] 96 % (06/25 0840) FiO2 (%):  [5 %-32 %] 32 % (06/24 1452) Weight:  [108.9 kg (240 lb 1.3 oz)] 108.9 kg (240 lb 1.3 oz) (06/25 0415) Weight change: -0.7 kg (-1 lb 8.7 oz) Last BM Date: 03/01/12  Intake/Output from previous day: 06/24 0701 - 06/25 0700 In: 1968 [P.O.:720; I.V.:1048; IV Piggyback:200] Out: 1950 [Urine:1950] Intake/Output this shift: Total I/O In: 320 [P.O.:240; I.V.:30; IV Piggyback:50] Out: -   General appearance: alert, cooperative, mild distress Resp: Scattered wheezes with basilar crackles bilaterally- still breathing quite fast at rest- on 6 L [place]- pulse ox 95% Cardio: regular rate and rhythm- tachycardic- S1, S2 normal, no murmur, click GI: soft, non-tender; bowel sounds normal; no masses,  no organomegaly Extremities: extremities normal, atraumatic, no cyanosis or edema Neurologic: Grossly normal  Lab Results:  Basename 03/02/12 0421 03/01/12 0343  WBC 8.8 11.3*  HGB  9.6* 9.5*  HCT 27.5* 27.7*  PLT 261 204   BMET  Basename 03/02/12 0421 03/01/12 0343  NA 137 133*  K 3.5 3.3*  CL 102 100  CO2 27 24  GLUCOSE 91 90  BUN 3* 5*  CREATININE 0.48* 0.55  CALCIUM 7.9* 7.6*    Studies/Results: Dg Chest Port 1 View  03/02/2012  *RADIOLOGY REPORT*  Clinical Data: Follow-up infiltrates.  PORTABLE CHEST - 1 VIEW  Comparison: 02/26/2012  Findings: Bilateral airspace opacities have progressed since prior study.  There is cardiomegaly.  No visible effusions.  No acute bony abnormality  IMPRESSION: Worsening diffuse bilateral interstitial and alveolar opacities. This could represent edema or infection.  Original Report Authenticated By: Cyndie Chime, M.D.    Medications: I have reviewed the patient's current medications.  Assessment/Plan:  Principal Problem:  *Sepsis Due to PNA   Acute respiratory failure with hypoxia/Community acquired pneumonia REquired BiPAP but now much improved and on NS *negative strep and Legionella and Influenza *Repeat chest x-ray demonstrates a more diffuse process than previously seen right middle lobe infiltrate - possible viral etiology.  - follow resp status carefully in SDU- current dyspnea may be anxiety and Ativan was given but if no improvement is seen, will follow up further.    Thrombocytopenia/ Leukopenia with low lymphocyte count *Patient's platelet count was normal in 2007  * related to Sepsis  -improved therefore will resume blood thinner- Lovenox for DVT prophylaxis   Dehydration with hyponatremia *Suspect hyponatremia primarily mediated by volume depletion  *Also given  severe pulmonary infection can likely have pulmonary mediated hyponatremia Has resolved   Hypokalemia *Oral repletion as needed  Watery stool Improved   Anemia - iron deficiency   *No signs of acute bleeding Iron level low- will eventually need replacement   Abnormal transaminases/ Abdominal pain *Likely related to severity of  infection but we'll check a hepatitis panel to rule out acute hepatitis given complaints of abdominal pain *Pain is primarily generalized in the upper abdominal area and is most likely related to persistent coughing prior to admission *LFTs were normal in 2007-  * Hepatitis panel negative * LFTs noted to be improving    Peripheral neuropathy- unspecified *Has been treated as an outpatient with high-dose Neurontin and Tegretol - recently narcotics added *Patient describes burning pain in the feet and tibial regions *B12 normal *Has been evaluated for arterial insufficiency but based on what wife told us sounds like he has venous insufficiency and associated end of day edema in the lower extremities   Left bundle-branch block *This is chronic dating back to 2007 *Underwent cardiac catheterization at that time which showed no coronary disease   Rhabdomyolysis/elevated CPK *Likely related to dehydration and this could also influence elevated transaminases  CPK improving Hydration has been stopped due to improved PO intake and systolic CHF    Chronic systolic HF:  Per 2Decho done this admit. EF 45-50% with diffuse hypokinesis.  Monitor volume status very closely.     Disposition *Remain in stepdown    LOS: 5 days  03/02/2012, 11:07 AM  Calvert Cantor, MD [phone]

## 2012-03-02 NOTE — Progress Notes (Signed)
Pt states that he feels better.  Respirations unlabored, sats WNL but remains on 5LNC.  Will monitor for changes.

## 2012-03-03 DIAGNOSIS — A413 Sepsis due to Hemophilus influenzae: Secondary | ICD-10-CM

## 2012-03-03 DIAGNOSIS — J159 Unspecified bacterial pneumonia: Secondary | ICD-10-CM

## 2012-03-03 DIAGNOSIS — E871 Hypo-osmolality and hyponatremia: Secondary | ICD-10-CM

## 2012-03-03 DIAGNOSIS — J9600 Acute respiratory failure, unspecified whether with hypoxia or hypercapnia: Secondary | ICD-10-CM

## 2012-03-03 MED ORDER — LEVALBUTEROL HCL 0.63 MG/3ML IN NEBU
0.6300 mg | INHALATION_SOLUTION | Freq: Four times a day (QID) | RESPIRATORY_TRACT | Status: DC
  Administered 2012-03-03 – 2012-03-05 (×5): 0.63 mg via RESPIRATORY_TRACT
  Filled 2012-03-03 (×10): qty 3

## 2012-03-03 MED ORDER — SIMETHICONE 40 MG/0.6ML PO SUSP
80.0000 mg | Freq: Four times a day (QID) | ORAL | Status: DC | PRN
  Administered 2012-03-03: 80 mg via ORAL
  Filled 2012-03-03 (×3): qty 1.2

## 2012-03-03 MED ORDER — LEVALBUTEROL HCL 1.25 MG/0.5ML IN NEBU
1.2500 mg | INHALATION_SOLUTION | RESPIRATORY_TRACT | Status: DC | PRN
  Administered 2012-03-04: 1.25 mg via RESPIRATORY_TRACT
  Filled 2012-03-03: qty 0.5

## 2012-03-03 MED ORDER — POTASSIUM CHLORIDE CRYS ER 20 MEQ PO TBCR
40.0000 meq | EXTENDED_RELEASE_TABLET | Freq: Two times a day (BID) | ORAL | Status: AC
  Administered 2012-03-03 – 2012-03-04 (×3): 40 meq via ORAL
  Filled 2012-03-03 (×3): qty 2

## 2012-03-03 MED ORDER — SIMETHICONE 80 MG PO CHEW
80.0000 mg | CHEWABLE_TABLET | Freq: Four times a day (QID) | ORAL | Status: DC | PRN
  Administered 2012-03-03 – 2012-03-04 (×2): 80 mg via ORAL
  Filled 2012-03-03 (×3): qty 1

## 2012-03-03 MED ORDER — IPRATROPIUM BROMIDE 0.02 % IN SOLN
0.5000 mg | Freq: Four times a day (QID) | RESPIRATORY_TRACT | Status: DC
  Administered 2012-03-03 – 2012-03-05 (×5): 0.5 mg via RESPIRATORY_TRACT
  Filled 2012-03-03 (×7): qty 2.5

## 2012-03-03 NOTE — Progress Notes (Signed)
MEDICATION RELATED CONSULT NOTE - INITIAL   Pharmacy Consult for Tegretol Indication: peripheral neuropathy  No Known Allergies  Patient Measurements: Height: 5' 11.5" (181.6 cm) Weight: 244 lb 0.8 oz (110.7 kg) IBW/kg (Calculated) : 76.45   Labs:  Basename 03/02/12 0421 03/01/12 0343  WBC 8.8 11.3*  HGB 9.6* 9.5*  HCT 27.5* 27.7*  PLT 261 204  APTT -- --  CREATININE 0.48* 0.55  LABCREA -- --  CREATININE 0.48* 0.55  CREAT24HRUR -- --  MG -- --  PHOS -- --  ALBUMIN 2.3* --  PROT 5.3* --  ALBUMIN 2.3* --  AST 63* --  ALT 79* --  ALKPHOS 262* --  BILITOT 0.8 --  BILIDIR -- --  IBILI -- --   Tegretol levels: 6/21 8 6/22 9.2  Estimated Creatinine Clearance: 117.4 ml/min (by C-G formula based on Cr of 0.48).  Medical History: Past Medical History  Diagnosis Date  . Peripheral neuropathy   . Neuropathy 02/26/2012  . Left bundle-branch block 02/26/2012   Assessment: 66 yo male on Tegretol for peripheral neuropathy. He takes 400 mg twice daily which was resumed.  Initially patient presented to the hospital with a low WBC, hyponatremia and elevated LFT's which can be associated with Tegretol. However, Tegretol levels were checked on 6/21 and 6/22 and both levels were in the therapeutic range.  More recent labs show that WBC are normal, hyponatremia has resolved, and LFT's are trending down.  Goal of Therapy:  Tegretol level 4-12 mcg/ml  Plan:  1. Continue Tegretol at home dose  [facility], [place].D., BCPS Clinical Pharmacist Pager: [phone] 03/03/2012 3:18 PM

## 2012-03-03 NOTE — Progress Notes (Signed)
[facility] [place] TEAM 1 - Stepdown/ICU TEAM  66yo male with pmh of LBB and peripheral neuropathy presented to ED on 6/20 with complaints of fever, cough, shortness of breath and abdominal pain x 4-5 days. He was seen initially at [facility] on 6/19 and found to have probable pneumonia and leukopenia. He decided at that time to sign-out AMA. He returned to the ED again on 6/20 with worsening symptoms with associated weakness and fatigue. He described orthopnea. He was referred to [facility] for admission. Enroute to [facility], pt began to have more oxygen requirement leading to admission to SDU. He required BIPAP on 6/23.   Subjective: Pt is feeling better today.  He continues to feel sob, but does feel that this is slowly improving.  He denies cp, n/v, or abdom pain.  He continues to have a bothersome cough.    Objective: Blood pressure 131/66, pulse 86, temperature 98.4 F (36.9 C), temperature source Oral, resp. rate 22, height 5' 11.5" (1.816 m), weight 110.7 kg (244 lb 0.8 oz), SpO2 96.00%.  Intake/Output from previous day: 06/25 0701 - 06/26 0700 In: 1880 [P.O.:720; I.V.:210; IV Piggyback:950] Out: 750 [Urine:750] Intake/Output this shift: Total I/O In: 330 [P.O.:240; I.V.:40; IV Piggyback:50] Out: 700 [Urine:700]  General appearance: alert, cooperative, no acute resp distress Resp: fine crackles noted th/o - no wheeze  Cardio: regular rate and rhythm w/o gallup or rub GI: soft, non-tender; bowel sounds normal; no masses,  no organomegaly Extremities: extremities normal, atraumatic, no cyanosis; trace B le edema Neurologic: Grossly normal  Lab Results:  Basename 03/02/12 0421 03/01/12 0343  WBC 8.8 11.3*  HGB 9.6* 9.5*  HCT 27.5* 27.7*  PLT 261 204   BMET  Basename 03/02/12 0421 03/01/12 0343  NA 137 133*  K 3.5 3.3*  CL 102 100  CO2 27 24  GLUCOSE 91 90  BUN 3* 5*  CREATININE 0.48* 0.55  CALCIUM 7.9* 7.6*    Studies/Results: Dg Chest Port 1 View  03/02/2012   *RADIOLOGY REPORT*  Clinical Data: Follow-up infiltrates.  PORTABLE CHEST - 1 VIEW  Comparison: 02/26/2012  Findings: Bilateral airspace opacities have progressed since prior study.  There is cardiomegaly.  No visible effusions.  No acute bony abnormality  IMPRESSION: Worsening diffuse bilateral interstitial and alveolar opacities. This could represent edema or infection.  Original Report Authenticated By: Cyndie Chime, M.D.   Medications: I have reviewed the patient's current medications.  Assessment/Plan:  Sepsis Due to PNA - now resolved w/ volume resuscitation and abx tx  Acute hypoxic respiratory failure due to Community acquired pneumonia required BiPAP but now much improved and on [place] - negative strep, Legionella, and Influenza - repeat chest x-ray demonstrates a more diffuse process than previously noted right middle lobe infiltrate - will f/u again in AM w/ CXR  Thrombocytopenia/ Leukopenia with low lymphocyte count patient's platelet count was normal in 2007 - felt to be due to sepsis - has now resolved spontaneosuly  Dehydration with hyponatremia Suspect hyponatremia primarily mediated by volume depletion - also given severe pulmonary infection could have pulmonary mediated hyponatremia - resolved  Hypokalemia Oral repletion as needed - push to goal of 4.0  Watery stool Resolved - likely an inflammatory mediated response  Anemia - iron deficiency   No signs of acute bleeding - Iron level low but so are all Fe indices - this is likely a reflection of his acute illness -  will need f/u in the outpt setting   Abnormal transaminases/ Abdominal  pain Likely related to severity of infection/shock liver -  hepatitis panel negative - improving   Peripheral neuropathy- unspecified Has been treated as an outpatient with high-dose Neurontin and Tegretol - recently narcotics added - Patient describes burning pain in the feet and tibial regions - B12 normal - Has been evaluated for  arterial insufficiency but based on what wife told us sounds like he has venous insufficiency and associated end of day edema in the lower extremities  Left bundle-branch block This is chronic dating back to 2007 - underwent cardiac catheterization at that time which showed no coronary disease  Rhabdomyolysis/elevated CPK related to dehydration - resolved   Chronic systolic HF Per 2Decho done this admit EF 45-50% with diffuse hypokinesis.  Monitor volume status very closely.   Disposition Stable for transfer to medical bed - anticipate another 48-72hrs will be required at minimum to wean O2 and strengthen to point of stability for d/c home   LOS: 6 days  03/03/2012, 2:08 PM  Lonia Blood, MD [facility] Office  [phone] Pager [phone]  On-Call/Text Page:      [email or web address]      password [facility]

## 2012-03-04 ENCOUNTER — Encounter (HOSPITAL_COMMUNITY): Payer: Self-pay | Admitting: Radiology

## 2012-03-04 ENCOUNTER — Inpatient Hospital Stay (HOSPITAL_COMMUNITY): Payer: Medicare Other

## 2012-03-04 DIAGNOSIS — A413 Sepsis due to Hemophilus influenzae: Secondary | ICD-10-CM

## 2012-03-04 DIAGNOSIS — J159 Unspecified bacterial pneumonia: Secondary | ICD-10-CM

## 2012-03-04 DIAGNOSIS — J9600 Acute respiratory failure, unspecified whether with hypoxia or hypercapnia: Secondary | ICD-10-CM

## 2012-03-04 DIAGNOSIS — I5023 Acute on chronic systolic (congestive) heart failure: Secondary | ICD-10-CM

## 2012-03-04 LAB — COMPREHENSIVE METABOLIC PANEL
AST: 59 U/L — ABNORMAL HIGH (ref 0–37)
BUN: 3 mg/dL — ABNORMAL LOW (ref 6–23)
CO2: 30 mEq/L (ref 19–32)
Chloride: 100 mEq/L (ref 96–112)
Creatinine, Ser: 0.48 mg/dL — ABNORMAL LOW (ref 0.50–1.35)
GFR calc non Af Amer: >90 mL/min (ref >90)
Total Bilirubin: 0.5 mg/dL (ref 0.3–1.2)

## 2012-03-04 LAB — CBC
HCT: 27.3 % — ABNORMAL LOW (ref 39.0–52.0)
MCV: 91 fL (ref 78.0–100.0)
Platelets: 427 10*3/uL — ABNORMAL HIGH (ref 150–400)
RBC: 3 MIL/uL — ABNORMAL LOW (ref 4.22–5.81)
WBC: 9.3 10*3/uL (ref 4.0–10.5)

## 2012-03-04 LAB — CULTURE, BLOOD (ROUTINE X 2): Culture: NO GROWTH

## 2012-03-04 LAB — LIPASE, BLOOD: Lipase: 57 U/L (ref 11–59)

## 2012-03-04 MED ORDER — FUROSEMIDE 10 MG/ML IJ SOLN
40.0000 mg | Freq: Two times a day (BID) | INTRAMUSCULAR | Status: AC
  Administered 2012-03-04 – 2012-03-05 (×3): 40 mg via INTRAVENOUS
  Filled 2012-03-04 (×3): qty 4

## 2012-03-04 MED ORDER — IOHEXOL 300 MG/ML  SOLN
100.0000 mL | Freq: Once | INTRAMUSCULAR | Status: AC | PRN
  Administered 2012-03-04: 100 mL via INTRAVENOUS

## 2012-03-04 MED ORDER — IOHEXOL 300 MG/ML  SOLN
20.0000 mL | INTRAMUSCULAR | Status: AC
  Administered 2012-03-04: 20 mL via ORAL

## 2012-03-04 MED ORDER — LEVOFLOXACIN IN D5W 750 MG/150ML IV SOLN
750.0000 mg | INTRAVENOUS | Status: DC
  Administered 2012-03-04: 750 mg via INTRAVENOUS
  Filled 2012-03-04 (×2): qty 150

## 2012-03-04 NOTE — Progress Notes (Signed)
   CARE MANAGEMENT NOTE 03/04/2012  Patient:  James Fletcher, James Fletcher   Account Number:  [id number]  Date Initiated:  02/27/2012  Documentation initiated by:  Donn Pierini  Subjective/Objective Assessment:   Pt admitted with SIRS, resp distress- BIPAP     Action/Plan:   PTA pt lived at home with spouse, independent with ADLs   Anticipated DC Date:  03/02/2012   Anticipated DC Plan:  HOME/SELF CARE      DC Planning Services  CM consult      Choice offered to / List presented to:             Status of service:  In process, will continue to follow Medicare Important Message given?   (If response is "NO", the following Medicare IM given date fields will be blank) Date Medicare IM given:   Date Additional Medicare IM given:    Discharge Disposition:    Per UR Regulation:  Reviewed for med. necessity/level of care/duration of stay  If discussed at Long Length of Stay Meetings, dates discussed:   03/03/2012    Comments:  03/03/12- 1400- Donn Pierini RN, BSN [phone] Spoke with pt and wife at bedside- per conversation pt and wife would like to stay here but will tx to [facility] hospital if need to for [place] to cover bill. Pt states that he has never signed up for Medicare and that his VA benefits are his only insurance. Does not want to sign VA paperwork until sure about VA coverage. Call made to Gaylyn Rong at the [facility]- per conversation with [name] pt is fully connected with the [place]- she does not show that pt has Medicare- unsure why?- and states that if pt is going to remain in hospital past another 24-48 hrs then we would need to look at possible tx to the [facility]- she is to fax other paperwork needed for possible transfer. Also called [facility] admitting- to check on Medicare- per admitting pt has verified Medicare coverage but they do not have pt VA info- informed pt and wife of above- pt's wife to take [place] card down to Admitting to file. Per MD pt may be ready for discharge in the next 24-48 hrs-  pt is hopeful that he will not need to tx to the [place]- but willing if he needs to.   03/02/2012  [address] RN, [place] [phone] Telephone call to [facility]:  [name] [phone] ext  [phone] Left voice mail message with update on medical status.  03/02/2012  1500 Darlyne Russian RN, [place] [phone] Met with patient and spouse regarding CM and discharge planning. The patient is followed by the [facility]. The patient had been seen at the [facility] Center and they planned to drive to the [place] center. He was so sick they did not attempt the trip to [place]. The MD said he is getting better and would be transferred to another unit,the wife said plan to complete his medical treatment here. Advised CM to update VA regarding status. CM to continue to follow for discharge planning.  PCP- [facility]  02/27/12- 1625- Donn Pierini RN, BSN [phone] Spoke with pt and son at bedside- per conversation pt has PCP at the [facility]- and gets his medications from the [place] also. Pt lives at home- plans to return home at discharge. [facility] [place] notified of pt's admission. NCM to follow

## 2012-03-04 NOTE — Progress Notes (Signed)
Pt developed severe abdominal pain throughout afternoon. PRN meds were given and provided no relief to discomfort. Abdomen was soft but very tender to touch. Bowel sounds hypoactive. MD called and orders received. Will continue to monitor.

## 2012-03-04 NOTE — Progress Notes (Signed)
ANTIBIOTIC CONSULT NOTE - FOLLOW UP  Pharmacy Consult for vancomycin Indication: Community Acquired Pneumonia  Assessment: 65 yom now only on vancomycin therapy for his pneumonia. Today is day #7 of vancomycin therapy. Pt is afebrile today and WBC is within normal limits at 9.3. All cultures (urine, blood, cdiff PCR, sputum, Flu PCR) are negative to date. Therapy was adjust on 6/24 with a stop date in place of 03/08/12.  Goal of Therapy:  Vancomycin trough level 15-20 mcg/ml  Plan:  1. Continue vancomycin at 1gm IV Q8H 2. F/u trough at [facility] 03/05/12   Vital Signs: Temp: 98 F (36.7 C) (06/27 0601) Temp src: Oral (06/27 0601) BP: 136/70 mmHg (06/27 0601) Pulse Rate: 80  (06/27 0601) Intake/Output from previous day: 06/26 0701 - 06/27 0700 In: 570 [P.O.:480; I.V.:40; IV Piggyback:50] Out: 850 [Urine:850] Labs:  [facility] 03/04/12 0610 03/02/12 0421  WBC 9.3 8.8  HGB 9.2* 9.6*  PLT 427* 261  LABCREA -- --  CREATININE 0.48* 0.48*   Estimated Creatinine Clearance: 117.4 ml/min (by C-G formula based on Cr of 0.48). No results found for this basename: VANCOTROUGH:2,VANCOPEAK:2,VANCORANDOM:2,GENTTROUGH:2,GENTPEAK:2,GENTRANDOM:2,TOBRATROUGH:2,TOBRAPEAK:2,TOBRARND:2,AMIKACINPEAK:2,AMIKACINTROU:2,AMIKACIN:2, in the last 72 hours   Microbiology: Recent Results (from the past 720 hour(s))  CULTURE, BLOOD (ROUTINE X 2)     Status: Normal   Collection Time   02/26/12  5:50 PM      Component Value Range Status Comment   Specimen Description BLOOD HAND LEFT   Final    Special Requests BOTTLES DRAWN AEROBIC AND ANAEROBIC 10CC   Final    Culture  Setup Time [id number]   Final    Culture NO GROWTH 5 DAYS   Final    Report Status 03/04/2012 FINAL   Final   CULTURE, BLOOD (ROUTINE X 2)     Status: Normal   Collection Time   02/26/12  5:55 PM      Component Value Range Status Comment   Specimen Description BLOOD HAND LEFT   Final    Special Requests BOTTLES DRAWN AEROBIC ONLY 5CC   Final    Culture  Setup Time [id number]   Final    Culture NO GROWTH 5 DAYS   Final    Report Status 03/04/2012 FINAL   Final   MRSA PCR SCREENING     Status: Normal   Collection Time   02/26/12  6:17 PM      Component Value Range Status Comment   MRSA by PCR NEGATIVE  NEGATIVE Final   CULTURE, EXPECTORATED SPUTUM-ASSESSMENT     Status: Normal   Collection Time   02/27/12  8:43 AM      Component Value Range Status Comment   Specimen Description SPUTUM   Final    Special Requests NONE   Final    Sputum evaluation     Final    Value: MICROSCOPIC FINDINGS SUGGEST THAT THIS SPECIMEN IS NOT REPRESENTATIVE OF LOWER RESPIRATORY SECRETIONS. PLEASE RECOLLECT.     CALLED TO B BRADT, RN 02/27/12 1000 BY K SCHULTZ   Report Status 02/27/2012 FINAL   Final   URINE CULTURE     Status: Normal   Collection Time   02/27/12 11:31 AM      Component Value Range Status Comment   Specimen Description URINE, CLEAN CATCH   Final    Special Requests NONE   Final    Culture  Setup Time [id number]   Final    Colony Count NO GROWTH   Final    Culture NO GROWTH  Final    Report Status 02/28/2012 FINAL   Final   CLOSTRIDIUM DIFFICILE BY PCR     Status: Normal   Collection Time   02/27/12  7:01 PM      Component Value Range Status Comment   C difficile by pcr NEGATIVE  NEGATIVE Final     Anti-infectives     Start     Dose/Rate Route Frequency Ordered Stop   03/01/12 1800   vancomycin (VANCOCIN) IVPB 1000 mg/200 mL premix        1,000 mg 200 mL/hr over 60 Minutes Intravenous Every 8 hours 03/01/12 1021 03/08/12 1759   02/27/12 0800   vancomycin (VANCOCIN) IVPB 1000 mg/200 mL premix  Status:  Discontinued        1,000 mg 200 mL/hr over 60 Minutes Intravenous Every 12 hours 02/26/12 1838 03/01/12 1021   02/26/12 2000   vancomycin (VANCOCIN) 2,000 mg in sodium chloride 0.9 % 500 mL IVPB        2,000 mg 250 mL/hr over 120 Minutes Intravenous  Once 02/26/12 1838 02/26/12 2346   02/26/12 1930   ceFEPIme (MAXIPIME)  2 g in dextrose 5 % 50 mL IVPB        2 g 100 mL/hr over 30 Minutes Intravenous Every 12 hours 02/26/12 1838 03/04/12 0642   02/26/12 1900   azithromycin (ZITHROMAX) 500 mg in dextrose 5 % 250 mL IVPB        500 mg 250 mL/hr over 60 Minutes Intravenous Every 24 hours 02/26/12 1809 03/03/12 2230   02/26/12 1500   moxifloxacin (AVELOX) IVPB 400 mg        400 mg 250 mL/hr over 60 Minutes Intravenous  Once 02/26/12 1455 02/26/12 1625          Alnisa Hasley, Dorena Cookey 03/04/2012,1:28 PM

## 2012-03-04 NOTE — Progress Notes (Signed)
Patient ID: James Fletcher  male  JYN:[id number]    DOB: 08-21-1946    DOA: 02/26/2012  PCP: No primary provider on file.  Interim history 66yo male with pmh of LBB and peripheral neuropathy presented to ED on 6/20 with complaints of fever, cough, shortness of breath and abdominal pain x 4-5 days. He was seen initially at [facility] on 6/19 and found to have probable pneumonia and leukopenia. He decided at that time to sign-out AMA. He returned to the ED again on 6/20 with worsening symptoms with associated weakness and fatigue. He described orthopnea. He was referred to [facility] for admission. Enroute to [facility], pt began to have more oxygen requirement leading to admission to SDU. He required BIPAP on 6/23. Patient was transferred out from the step down unit to the floor on 03/03/2012.     Subjective: Still complaining of orthopnea and and dyspnea on exertion. On O2 3 L via nasal cannula   Objective: Weight change:   Intake/Output Summary (Last 24 hours) at 03/04/12 1426 Last data filed at 03/03/12 1555  Gross per 24 hour  Intake      0 ml  Output    150 ml  Net   -150 ml   Blood pressure 146/72, pulse 87, temperature 98.6 F (37 C), temperature source Oral, resp. rate 20, height 5' 11.5" (1.816 m), weight 110.7 kg (244 lb 0.8 oz), SpO2 93.00%.  Physical Exam: General: Alert and awake, oriented x3, not in any acute distress. HEENT: anicteric sclera, pupils reactive to light and accommodation, EOMI CVS: S1-S2 clear, no murmur rubs or gallops Chest: Fine crackles at the bases, no wheezing  Abdomen: soft nontender, nondistended, normal bowel sounds, no organomegaly Extremities: no cyanosis, clubbing trace edema bilaterally  Neuro: Cranial nerves II-XII intact, no focal neurological deficits  Lab Results: Basic Metabolic Panel:  Lab 03/04/12 8657 03/02/12 0421  NA 138 137  K 3.5 3.5  CL 100 102  CO2 30 27  GLUCOSE 92 91  BUN 3* 3*  CREATININE 0.48* 0.48*  CALCIUM 8.1* 7.9*  MG -- --   PHOS -- --   Liver Function Tests:  Lab 03/04/12 0610 03/02/12 0421  AST 59* 63*  ALT 68* 79*  ALKPHOS 293* 262*  BILITOT 0.5 0.8  PROT 5.6* 5.3*  ALBUMIN 2.4* 2.3*   No results found for this basename: LIPASE:2,AMYLASE:2 in the last 168 hours No results found for this basename: AMMONIA:2 in the last 168 hours CBC:  Lab 03/04/12 0610 03/02/12 0421 02/28/12 0420  WBC 9.3 8.8 --  NEUTROABS -- -- 4.3  HGB 9.2* 9.6* --  HCT 27.3* 27.5* --  MCV 91.0 89.3 --  PLT 427* 261 --   Cardiac Enzymes:  Lab 02/28/12 0859 02/27/12 1010 02/27/12 0153 02/26/12 1808  CKTOTAL 593* 1230* 1379* --  CKMB -- 14.9* 13.4* 8.4*  CKMBINDEX -- -- -- --  TROPONINI -- <0.30 <0.30 <0.30   BNP: No components found with this basename: POCBNP:2 CBG:  Lab 02/27/12 0430 02/26/12 2001  GLUCAP 111* 137*     Micro Results: Recent Results (from the past 240 hour(s))  CULTURE, BLOOD (ROUTINE X 2)     Status: Normal   Collection Time   02/26/12  5:50 PM      Component Value Range Status Comment   Specimen Description BLOOD HAND LEFT   Final    Special Requests BOTTLES DRAWN AEROBIC AND ANAEROBIC 10CC   Final    Culture  Setup Time [id number]  Final    Culture NO GROWTH 5 DAYS   Final    Report Status 03/04/2012 FINAL   Final   CULTURE, BLOOD (ROUTINE X 2)     Status: Normal   Collection Time   02/26/12  5:55 PM      Component Value Range Status Comment   Specimen Description BLOOD HAND LEFT   Final    Special Requests BOTTLES DRAWN AEROBIC ONLY 5CC   Final    Culture  Setup Time [id number]   Final    Culture NO GROWTH 5 DAYS   Final    Report Status 03/04/2012 FINAL   Final   MRSA PCR SCREENING     Status: Normal   Collection Time   02/26/12  6:17 PM      Component Value Range Status Comment   MRSA by PCR NEGATIVE  NEGATIVE Final   CULTURE, EXPECTORATED SPUTUM-ASSESSMENT     Status: Normal   Collection Time   02/27/12  8:43 AM      Component Value Range Status Comment   Specimen  Description SPUTUM   Final    Special Requests NONE   Final    Sputum evaluation     Final    Value: MICROSCOPIC FINDINGS SUGGEST THAT THIS SPECIMEN IS NOT REPRESENTATIVE OF LOWER RESPIRATORY SECRETIONS. PLEASE RECOLLECT.     CALLED TO B BRADT, RN 02/27/12 1000 BY K SCHULTZ   Report Status 02/27/2012 FINAL   Final   URINE CULTURE     Status: Normal   Collection Time   02/27/12 11:31 AM      Component Value Range Status Comment   Specimen Description URINE, CLEAN CATCH   Final    Special Requests NONE   Final    Culture  Setup Time [id number]   Final    Colony Count NO GROWTH   Final    Culture NO GROWTH   Final    Report Status 02/28/2012 FINAL   Final   CLOSTRIDIUM DIFFICILE BY PCR     Status: Normal   Collection Time   02/27/12  7:01 PM      Component Value Range Status Comment   C difficile by pcr NEGATIVE  NEGATIVE Final     Studies/Results: Dg Chest 2 View  02/26/2012  *RADIOLOGY REPORT*  Clinical Data: Chest congestion.  Cough.  Smoker.  Epigastric pain.  CHEST - 2 VIEW  Comparison: 10/31/2005  Findings: Central peribronchial thickening is noted bilaterally. Asymmetric airspace disease is seen in the central right upper lobe, suspicious for pneumonia.  No evidence of pleural effusion. No mass or lymphadenopathy identified.  Heart size is within normal limits.  IMPRESSION: Asymmetric airspace disease and central right upper lobe, suspicious for pneumonia. Post-treatment  radiographic followup recommended to confirm resolution.  Original Report Authenticated By: Danae Orleans, M.D.   US Abdomen Complete  02/26/2012  *RADIOLOGY REPORT*  Clinical Data:  Epigastric abdominal pain.  Elevated liver function tests.  ABDOMINAL ULTRASOUND COMPLETE  Comparison:  CT on 10/31/2005  Findings:  Gallbladder:  No gallstones, gallbladder wall thickening, or pericholecystic fluid.  Common Bile Duct:  Within normal limits in caliber. Measures 2 mm in diameter.  Liver: Within normal limits in parenchymal  echogenicity.  A 1.9 cm hyperechoic mass is seen in the left hepatic lobe which is consistent with a benign hemangioma and stable compared with previous CT.  No other liver masses are identified.  IVC:  Appears normal.  Pancreas:  Not well visualized due to  overlying bowel gas.  Spleen:  Within normal limits in size and echotexture.  Right kidney:  Normal in size and parenchymal echogenicity.  No evidence of mass or hydronephrosis.  Left kidney:  Normal in size and parenchymal echogenicity.  No evidence of mass or hydronephrosis.  Abdominal Aorta:  No aneurysm identified.  IMPRESSION:  1.  No evidence of gallstones, biliary dilatation, or other acute findings. 2.  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   Dg Chest Port 1 View  03/04/2012  *RADIOLOGY REPORT*  Clinical Data: Acute respiratory failure.  Follow up infiltrate.  PORTABLE CHEST - 1 VIEW  Comparison: 03/02/2012  Findings: Moderate cardiomegaly.  Bilateral diffuse airspace disease has markedly improved.  Vascular congestion persists.  No pneumothorax.  IMPRESSION: Markedly improved bilateral airspace disease.  Original Report Authenticated By: Donavan Burnet, M.D.   Dg Chest Port 1 View  03/02/2012  *RADIOLOGY REPORT*  Clinical Data: Follow-up infiltrates.  PORTABLE CHEST - 1 VIEW  Comparison: 02/26/2012  Findings: Bilateral airspace opacities have progressed since prior study.  There is cardiomegaly.  No visible effusions.  No acute bony abnormality  IMPRESSION: Worsening diffuse bilateral interstitial and alveolar opacities. This could represent edema or infection.  Original Report Authenticated By: Cyndie Chime, M.D.   Dg Chest Port 1 View  02/26/2012  *RADIOLOGY REPORT*  Clinical Data: Pulmonary infiltrate.  Shortness of breath.  Chest pain.  PORTABLE CHEST - 1 VIEW  Comparison: 06/20 and 02/25/2012  Findings: There is a  persistent hazy right perihilar infiltrate in the right upper lobe.  There is also persistent peribronchial thickening in the right lower lobe.  Heart size and vascularity are normal.  Left lung is clear except for peribronchial thickening.  IMPRESSION: Persistent hazy infiltrate in the right upper lobe with bilateral bronchitic changes.  Original Report Authenticated By: Gwynn Burly, M.D.   Dg Chest Portable 1 View  02/26/2012  *RADIOLOGY REPORT*  Clinical Data: Fever  PORTABLE CHEST - 1 VIEW  Comparison: 02/25/2012  Findings: Degraded by hypoaeration.  Interstitial prominence.  Mild right suprahilar and lung base opacities.  Mild cardiomegaly.  Mild central vascular congestion.  No pneumothorax.  No definite pleural effusion.  No acute osseous finding.  IMPRESSION: Interstitial prominence, bibasilar and asymmetric right suprahilar airspace opacities. May reflect pneumonia and/or edema. Allowing for differences in technique, similar to slightly worsened in the interval.  Original Report Authenticated By: Waneta Martins, M.D.   Dg Abd Portable 1v  02/26/2012  *RADIOLOGY REPORT*  Clinical Data: Shortness of breath, chest pain, abdominal pain and pressure  PORTABLE ABDOMEN - 1 VIEW  Comparison: Portable exam 1740 hours without priors for comparison.  Findings: Nonobstructive bowel gas pattern. No bowel dilatation or bowel wall thickening. Scattered gas within large and small bowel loops. Mild endplate spur formation lumbar spine. Osseous demineralization. No urinary tract calcification.  IMPRESSION: Nonobstructive bowel gas pattern.  Original Report Authenticated By: Lollie Marrow, M.D.    Medications: Scheduled Meds:   . aspirin  81 mg Oral Daily  . azithromycin  500 mg Intravenous Q24H  . bismuth subsalicylate  30 mL Oral Q6H  . carbamazepine  400 mg Oral BID  . ceFEPime (MAXIPIME) IV  2 g Intravenous Q12H  . enoxaparin (LOVENOX) injection  40 mg Subcutaneous q1800  . furosemide  40 mg  Intravenous Q12H  . gabapentin  1,200 mg Oral TID  .  guaiFENesin-dextromethorphan  5 mL Oral Q6H  . ipratropium  0.5 mg Nebulization QID  . levalbuterol  0.63 mg Nebulization QID  . levofloxacin (LEVAQUIN) IV  750 mg Intravenous Q24H  . pantoprazole  40 mg Oral Q supper  . potassium chloride  40 mEq Oral BID  . sodium chloride  3 mL Intravenous Q12H  . vancomycin  1,000 mg Intravenous Q8H  . DISCONTD: ipratropium  0.5 mg Nebulization BID  . DISCONTD: levalbuterol  0.63 mg Nebulization BID   Continuous Infusions:   . DISCONTD: sodium chloride 10 mL/hr at 03/01/12 1600     Assessment/Plan:  Sepsis with SIRS due to PNA - now resolved with IV fluids and antibiotic treatment   Acute hypoxic respiratory failure due to Community acquired pneumonia:  - Off BiPAP, however still requiring O2 via nasal cannula 3 L, wean as tolerated - negative strep, Legionella, and Influenza - repeat chest x-ray on 03/04/2029 this morning showed markedly improved bilateral airspace disease with persisting vascular congestion.  - Obtained a BNP stat, noted to be elevated at 1038, possibly acute on chronic systolic CHF precipitated due to IV fluid resuscitation, started on IV Lasix x3 doses, then will reevaluate and transition to oral Lasix - Currently only on vancomycin (day #7), completed cefepime and Zithromax. I added the levofloxacin.   Thrombocytopenia/ Leukopenia with low lymphocyte count : Improved  Dehydration with hyponatremia: Improved, possibly secondary to volume depletion and pulmonary mediated hyponatremia   Anemia - iron deficiency  No signs of acute bleeding - Iron level low but so are all Fe indices - this is likely a reflection of his acute illness - will need f/u in the outpt setting   Abnormal transaminases/ Abdominal pain: Likely related to severity of infection/shock liver - hepatitis panel negative - improving   Peripheral neuropathy- unspecified  Has been treated as an outpatient  with high-dose Neurontin and Tegretol, recently narcotics added  Left bundle-branch block: Chronic, 2007 - underwent cardiac catheterization at that time which showed no CAD   Rhabdomyolysis/elevated CPK: resolved   Acute on Chronic systolic HF : Likely precipitated due to volume overload Per 2Decho done this admit EF 45-50% with diffuse hypokinesis, continue Lasix, strict I.'s and O.'s.   DVT Prophylaxis: Lovenox  Code Status: Full code  Disposition: Hopefully DC in 1-2 days   LOS: 7 days   Ariana Cavenaugh M.D. [facility] 03/04/2012, 2:26 PM Pager: [phone]  If 7PM-7AM, please contact night-coverage [email or web address] Password [email or web address]

## 2012-03-04 NOTE — Evaluation (Signed)
Physical Therapy Evaluation Patient Details Name: ESGAR BARNICK MRN: [id number] DOB: 17-Aug-1946 Today's Date: 03/04/2012 Time: 6295-2841 PT Time Calculation (min): 16 min  PT Assessment / Plan / Recommendation Clinical Impression  Pt presents with a medical diagnosis of SIRS with complications including pneumonia. Pt is at his baseline functional mobility. No further acute PT needs. Please reorder if necessary    PT Assessment  Patent does not need any further PT services    Follow Up Recommendations  No PT follow up       Equipment Recommendations  None recommended by PT          Precautions / Restrictions Restrictions Weight Bearing Restrictions: No         Mobility  Bed Mobility Bed Mobility: Supine to Sit;Sitting - Scoot to Edge of Bed Supine to Sit: 7: Independent Sitting - Scoot to Edge of Bed: 7: Independent Transfers Transfers: Sit to Stand;Stand to Sit Sit to Stand: 5: Supervision;With upper extremity assist;From bed Stand to Sit: 5: Supervision;With upper extremity assist;To chair/3-in-1 Details for Transfer Assistance: Supervision for safety only Ambulation/Gait Ambulation/Gait Assistance: 5: Supervision Ambulation Distance (Feet): 200 Feet Assistive device: None Ambulation/Gait Assistance Details: Pt ambulated on 2L O2 with no shortness of breath. Encouraged pt and wife to ambulate with nursing staff to wean off O2 and build endurance back to normal Gait Pattern: Within Functional Limits Gait velocity: normal gait speed        Visit Information  Last PT Received On: 03/04/12 Assistance Needed: +1    Subjective Data      Prior Functioning  Home Living Lives With: Spouse Available Help at Discharge: Family (wife works during the day) Type of Home: House Home Access: Stairs to enter Secretary/administrator of Steps: 5 Entrance Stairs-Rails: Can reach both Home Layout: Laundry or work area in basement;One level Bathroom Shower/Tub: Haematologist: Yes How Accessible: Accessible via walker Home Adaptive Equipment: Walker - rolling Prior Function Level of Independence: Independent Able to Take Stairs?: Yes Driving: Yes Vocation: Retired Musician: No difficulties Dominant Hand: Right    Cognition  Overall Cognitive Status: Appears within functional limits for tasks assessed/performed Orientation Level: Appears intact for tasks assessed Behavior During Session: [facility] Dt for tasks performed    Extremity/Trunk Assessment Right Lower Extremity Assessment RLE ROM/Strength/Tone: Within functional levels RLE Sensation: History of peripheral neuropathy Left Lower Extremity Assessment LLE ROM/Strength/Tone: Within functional levels LLE Sensation: History of peripheral neuropathy   Balance    End of Session PT - End of Session Equipment Utilized During Treatment: Gait belt;Oxygen Activity Tolerance: Patient tolerated treatment well Patient left: with call bell/phone within reach;in chair;with family/visitor present Nurse Communication: Mobility status  GP     Milana Kidney 03/04/2012, 5:02 PM  03/04/2012 Milana Kidney DPT PAGER: [phone] OFFICE: [phone]

## 2012-03-05 DIAGNOSIS — J9600 Acute respiratory failure, unspecified whether with hypoxia or hypercapnia: Secondary | ICD-10-CM

## 2012-03-05 DIAGNOSIS — J159 Unspecified bacterial pneumonia: Secondary | ICD-10-CM

## 2012-03-05 DIAGNOSIS — E871 Hypo-osmolality and hyponatremia: Secondary | ICD-10-CM

## 2012-03-05 DIAGNOSIS — R109 Unspecified abdominal pain: Secondary | ICD-10-CM

## 2012-03-05 DIAGNOSIS — A413 Sepsis due to Hemophilus influenzae: Secondary | ICD-10-CM

## 2012-03-05 LAB — COMPREHENSIVE METABOLIC PANEL
Albumin: 2.8 g/dL — ABNORMAL LOW (ref 3.5–5.2)
Alkaline Phosphatase: 309 U/L — ABNORMAL HIGH (ref 39–117)
BUN: 4 mg/dL — ABNORMAL LOW (ref 6–23)
Calcium: 8.6 mg/dL (ref 8.4–10.5)
Potassium: 3.5 mEq/L (ref 3.5–5.1)
Sodium: 134 mEq/L — ABNORMAL LOW (ref 135–145)
Total Protein: 6.4 g/dL (ref 6.0–8.3)

## 2012-03-05 LAB — URINALYSIS, ROUTINE W REFLEX MICROSCOPIC
Ketones, ur: NEGATIVE mg/dL
Leukocytes, UA: NEGATIVE
Nitrite: NEGATIVE
Specific Gravity, Urine: 1.011 (ref 1.005–1.030)
pH: 6.5 (ref 5.0–8.0)

## 2012-03-05 LAB — CBC
HCT: 29.4 % — ABNORMAL LOW (ref 39.0–52.0)
Hemoglobin: 9.9 g/dL — ABNORMAL LOW (ref 13.0–17.0)
MCV: 90.5 fL (ref 78.0–100.0)
RDW: 13.4 % (ref 11.5–15.5)
WBC: 8.2 10*3/uL (ref 4.0–10.5)

## 2012-03-05 MED ORDER — SIMETHICONE 80 MG PO CHEW: 80.0000 mg | CHEWABLE_TABLET | Freq: Four times a day (QID) | ORAL | Status: DC | PRN

## 2012-03-05 MED ORDER — ALBUTEROL SULFATE HFA 108 (90 BASE) MCG/ACT IN AERS: 2.0000 | INHALATION_SPRAY | Freq: Four times a day (QID) | RESPIRATORY_TRACT | Status: DC | PRN

## 2012-03-05 MED ORDER — LEVOFLOXACIN 750 MG PO TABS: 750.0000 mg | ORAL_TABLET | Freq: Every day | ORAL | Status: AC

## 2012-03-05 MED ORDER — CARBAMAZEPINE 200 MG PO TABS: 200.0000 mg | ORAL_TABLET | Freq: Two times a day (BID) | ORAL | Status: DC

## 2012-03-05 MED ORDER — FUROSEMIDE 40 MG PO TABS: 40.0000 mg | ORAL_TABLET | Freq: Every day | ORAL | Status: DC

## 2012-03-05 MED ORDER — LEVOFLOXACIN 750 MG PO TABS
750.0000 mg | ORAL_TABLET | Freq: Every day | ORAL | Status: DC
Start: 2012-03-05 — End: 2012-03-05
  Administered 2012-03-05: 750 mg via ORAL
  Filled 2012-03-05: qty 1

## 2012-03-05 MED ORDER — FUROSEMIDE 40 MG PO TABS
40.0000 mg | ORAL_TABLET | Freq: Every day | ORAL | Status: DC
  Filled 2012-03-05: qty 1

## 2012-03-05 MED ORDER — LEVALBUTEROL HCL 0.63 MG/3ML IN NEBU: 0.6300 mg | INHALATION_SOLUTION | Freq: Two times a day (BID) | RESPIRATORY_TRACT | Status: DC

## 2012-03-05 MED ORDER — CARBAMAZEPINE 200 MG PO TABS
200.0000 mg | ORAL_TABLET | Freq: Two times a day (BID) | ORAL | Status: DC
  Administered 2012-03-05: 200 mg via ORAL
  Filled 2012-03-05 (×2): qty 1

## 2012-03-05 NOTE — Consult Note (Signed)
Subjective:   HPI  The patient is a 66 year old male who we are asked to see in consultation in regards to upper abdominal pain. The patient was admitted to the hospital a few days ago with cough and congestion and being treated for apparent pneumonia. He states to me that recently after eating he has been noticing an upper abdominal aching discomfort which might last an hour or 2 after eating. There is no nausea or vomiting. There is no hematemesis. An abdominal ultrasound a few days ago was negative. A CT scan of the abdomen done yesterday however showed evidence of a stone in the neck of the gallbladder. There was also some stranding around the pancreas and pancreatitis could not be ruled out, however his lipase is normal. He denies drinking alcohol. There were also findings on the CT scan of cystitis and pyelonephritis. His alkaline phosphatase is 309, AST 58, ALT 69.  Review of Systems   Past Medical History  Diagnosis Date  . Peripheral neuropathy   . Neuropathy 02/26/2012  . Left bundle-branch block 02/26/2012   Past Surgical History  Procedure Date  . Appendectomy    History   Social History  . Marital Status: Married    Spouse Name: N/A    Number of Children: N/A  . Years of Education: N/A   Occupational History  . Not on file.   Social History Main Topics  . Smoking status: Current Everyday Smoker -- 1.0 packs/day  . Smokeless tobacco: Not on file  . Alcohol Use: No  . Drug Use: No  . Sexually Active:    Other Topics Concern  . Not on file   Social History Narrative  . No narrative on file   family history is not on file. Current facility-administered medications:acetaminophen (TYLENOL) suppository 650 mg, 650 mg, Rectal, Q6H PRN, Maretta Bees, MD;  acetaminophen (TYLENOL) tablet 650 mg, 650 mg, Oral, Q6H PRN, Maretta Bees, MD, 650 mg at 02/27/12 1149;  alum & mag hydroxide-simeth (MAALOX/MYLANTA) 200-200-20 MG/5ML suspension 30 mL, 30 mL, Oral, Q6H PRN,  Maretta Bees, MD, 30 mL at 03/04/12 1606 aspirin chewable tablet 81 mg, 81 mg, Oral, Daily, Maretta Bees, MD, 81 mg at 03/05/12 0954;  benzonatate (TESSALON) capsule 100 mg, 100 mg, Oral, Q4H PRN, Calvert Cantor, MD, 100 mg at 03/04/12 0518;  bismuth subsalicylate (PEPTO BISMOL) 262 MG/15ML suspension 30 mL, 30 mL, Oral, Q6H, Saima Rizwan, MD, 30 mL at 03/05/12 0953;  carbamazepine (TEGRETOL) tablet 200 mg, 200 mg, Oral, BID, Ripudeep K Rai, MD, 200 mg at 03/05/12 0958 enoxaparin (LOVENOX) injection 40 mg, 40 mg, Subcutaneous, q1800, Calvert Cantor, MD, 40 mg at 03/03/12 1817;  furosemide (LASIX) injection 40 mg, 40 mg, Intravenous, Q12H, Ripudeep K Rai, MD, 40 mg at 03/05/12 0217;  furosemide (LASIX) tablet 40 mg, 40 mg, Oral, Daily, Ripudeep K Rai, MD;  gabapentin (NEURONTIN) capsule 1,200 mg, 1,200 mg, Oral, TID, Maretta Bees, MD, 1,200 mg at 03/05/12 0953 guaiFENesin-dextromethorphan (ROBITUSSIN DM) 100-10 MG/5ML syrup 5 mL, 5 mL, Oral, Q6H, Saima Rizwan, MD, 5 mL at 03/05/12 0953;  HYDROcodone-acetaminophen (NORCO) 5-325 MG per tablet 1-2 tablet, 1-2 tablet, Oral, Q4H PRN, Maretta Bees, MD, 2 tablet at 03/05/12 0439;  iohexol (OMNIPAQUE) 300 MG/ML solution 100 mL, 100 mL, Intravenous, Once PRN, Medication Radiologist, MD, 100 mL at 03/04/12 2332 iohexol (OMNIPAQUE) 300 MG/ML solution 20 mL, 20 mL, Oral, Q1 Hr x 2, Medication Radiologist, MD, 20 mL at 03/04/12 1900;  ipratropium (ATROVENT)  nebulizer solution 0.5 mg, 0.5 mg, Nebulization, QID, Lonia Blood, MD, 0.5 mg at 03/05/12 1610;  levalbuterol (XOPENEX) nebulizer solution 0.63 mg, 0.63 mg, Nebulization, QID, Lonia Blood, MD, 0.63 mg at 03/05/12 9604 levalbuterol (XOPENEX) nebulizer solution 1.25 mg, 1.25 mg, Nebulization, Q2H PRN, Lonia Blood, MD, 1.25 mg at 03/04/12 0831;  levofloxacin (LEVAQUIN) tablet 750 mg, 750 mg, Oral, Daily, Ripudeep K Rai, MD;  LORazepam (ATIVAN) injection 0.5-1 mg, 0.5-1 mg, Intravenous, Q8H  PRN, Calvert Cantor, MD, 1 mg at 03/03/12 2354;  ondansetron (ZOFRAN) injection 4 mg, 4 mg, Intravenous, Q6H PRN, Maretta Bees, MD, 4 mg at 02/27/12 1917 ondansetron (ZOFRAN) tablet 4 mg, 4 mg, Oral, Q6H PRN, Maretta Bees, MD;  pantoprazole (PROTONIX) EC tablet 40 mg, 40 mg, Oral, Q supper, Maretta Bees, MD, 40 mg at 03/03/12 1652;  simethicone (MYLICON) chewable tablet 80 mg, 80 mg, Oral, QID PRN, Lonia Blood, MD, 80 mg at 03/04/12 1509;  sodium chloride (OCEAN) 0.65 % nasal spray 1 spray, 1 spray, Each Nare, PRN, Jinger Neighbors, NP sodium chloride 0.9 % injection 3 mL, 3 mL, Intravenous, Q12H, Maretta Bees, MD, 3 mL at 03/05/12 1120;  DISCONTD: carbamazepine (TEGRETOL) tablet 400 mg, 400 mg, Oral, BID, Maretta Bees, MD, 400 mg at 03/04/12 2058;  DISCONTD: levofloxacin (LEVAQUIN) IVPB 750 mg, 750 mg, Intravenous, Q24H, Ripudeep K Rai, MD, 750 mg at 03/04/12 1649 DISCONTD: vancomycin (VANCOCIN) IVPB 1000 mg/200 mL premix, 1,000 mg, Intravenous, Q8H, Lonia Blood, MD, 1,000 mg at 03/05/12 0222 No Known Allergies   Objective:     BP 128/59  Pulse 71  Temp 98.1 F (36.7 C) (Oral)  Resp 18  Ht 5' 11.5" (1.816 m)  Wt 110.7 kg (244 lb 0.8 oz)  BMI 33.56 kg/m2  SpO2 98%  He is alert and oriented and in no acute distress  Skin nonicteric  Heart regular rhythm no murmurs  Lungs clear  Abdomen: Bowel sounds are normal it is soft and nontender, there is no obvious hepatosplenomegaly  Laboratory No components found with this basename: d1      Assessment:     Postprandial aching pain in the upper abdomen. With the presence of a stone in the neck of the gallbladder it is suspect that this is the source of his problem. We could consider doing a HIDA scan or an MRCP. I think however first I would like to get a surgical consult to see what they think.      Plan:     Surgical consult  Lab Results  Component Value Date   HGB 9.9* 03/05/2012   HGB 9.2*  03/04/2012   HGB 9.6* 03/02/2012   HCT 29.4* 03/05/2012   HCT 27.3* 03/04/2012   HCT 27.5* 03/02/2012   ALKPHOS 309* 03/05/2012   ALKPHOS 293* 03/04/2012   ALKPHOS 262* 03/02/2012   AST 58* 03/05/2012   AST 59* 03/04/2012   AST 63* 03/02/2012   ALT 69* 03/05/2012   ALT 68* 03/04/2012   ALT 79* 03/02/2012

## 2012-03-05 NOTE — Progress Notes (Signed)
Pt on 2L [place] this AM. O2 removed and 10 minutes later patient was at 95% resting in bed. Ambulated in hallway with out oxygen and saturation fluctuated between 88-92%. Pt had no c/o of SOB. After returning to room and resting pt returned to 94%. MD notified.

## 2012-03-05 NOTE — Consult Note (Signed)
Reason for Consult:Abdomianl pain for the past 4-5 days prior to admission on 02/26/12. Referring Physician: Dr. Berneta Levins is an 66 y.o. male.  HPI: Patient is a 66 year old WM who presented to [facility]  with a past medical history of known peripheral neuropathy,left bundle branch block . For the past 4-5 days prior to admission to [facility] he had not been in his usual state of health, for 5 days previously he had developed a cough which was mostly dry and at times produced a brownish phlegm. He also had upper abdominal discomfort. He re ported a fever as high as 103F at home a few days ago. He also stated that he had had occasional vomiting, his last vomiting episode was 1 day prior to admission.. The previous evening he had gone to [facility] [place],where he was found to have questionable pneumonia and was also found to be leukopenic with significant bandemia. He subsequently signed out AGAINST MEDICAL ADVICE and wanted to go to his primary care practitioner at the [place]. He returned the next day to the emergency room at [facility] [place] with worsening of his symptoms. Complaining of fatigue and some weakness. He also claimed to have shortness of breath. For these reasons he was admitted to [facility] step-down unit; subsequently treated for pneumonia and leukopenia. Which has resolved to the point at which he has been transferred to a regular medical floor. We are being asked to see him for evaluation of his transient abdominal pain.  Past Medical History  Diagnosis Date  . Peripheral neuropathy   . Neuropathy 02/26/2012  . Left bundle-branch block 02/26/2012    Past Surgical History  Procedure Date  . Appendectomy     No family history on file.  Social History:  reports that he has been smoking.  He does not have any smokeless tobacco history on file. He reports that he does not drink alcohol or use illicit drugs.  Allergies: No Known Allergies  Medications: I have reviewed the  patient's current medications.  Results for orders placed during the hospital encounter of 02/26/12 (from the past 48 hour(s))  CBC     Status: Abnormal   Collection Time   03/04/12  6:10 AM      Component Value Range Comment   WBC 9.3  4.0 - 10.5 K/uL WHITE COUNT CONFIRMED ON SMEAR   RBC 3.00 (*) 4.22 - 5.81 MIL/uL    Hemoglobin 9.2 (*) 13.0 - 17.0 g/dL    HCT 16.1 (*) 09.6 - 52.0 %    MCV 91.0  78.0 - 100.0 fL    MCH 30.7  26.0 - 34.0 pg    MCHC 33.7  30.0 - 36.0 g/dL    RDW 04.5  40.9 - 81.1 %    Platelets 427 (*) 150 - 400 K/uL   COMPREHENSIVE METABOLIC PANEL     Status: Abnormal   Collection Time   03/04/12  6:10 AM      Component Value Range Comment   Sodium 138  135 - 145 mEq/L    Potassium 3.5  3.5 - 5.1 mEq/L    Chloride 100  96 - 112 mEq/L    CO2 30  19 - 32 mEq/L    Glucose, Bld 92  70 - 99 mg/dL    BUN 3 (*) 6 - 23 mg/dL    Creatinine, Ser 9.14 (*) 0.50 - 1.35 mg/dL    Calcium 8.1 (*) 8.4 - 10.5 mg/dL  Total Protein 5.6 (*) 6.0 - 8.3 g/dL    Albumin 2.4 (*) 3.5 - 5.2 g/dL    AST 59 (*) 0 - 37 U/L    ALT 68 (*) 0 - 53 U/L    Alkaline Phosphatase 293 (*) 39 - 117 U/L    Total Bilirubin 0.5  0.3 - 1.2 mg/dL    GFR calc non Af Amer >90  >90 mL/min    GFR calc Af Amer >90  >90 mL/min   PRO B NATRIURETIC PEPTIDE     Status: Abnormal   Collection Time   03/04/12 11:01 AM      Component Value Range Comment   Pro B Natriuretic peptide (BNP) 1038.0 (*) 0 - 125 pg/mL   LIPASE, BLOOD     Status: Normal   Collection Time   03/04/12  5:08 PM      Component Value Range Comment   Lipase 57  11 - 59 U/L   URINALYSIS, ROUTINE W REFLEX MICROSCOPIC     Status: Normal   Collection Time   03/05/12  6:35 AM      Component Value Range Comment   Color, Urine YELLOW  YELLOW    APPearance CLEAR  CLEAR    Specific Gravity, Urine 1.011  1.005 - 1.030    pH 6.5  5.0 - 8.0    Glucose, UA NEGATIVE  NEGATIVE mg/dL    Hgb urine dipstick NEGATIVE  NEGATIVE    Bilirubin Urine NEGATIVE   NEGATIVE    Ketones, ur NEGATIVE  NEGATIVE mg/dL    Protein, ur NEGATIVE  NEGATIVE mg/dL    Urobilinogen, UA 0.2  0.0 - 1.0 mg/dL    Nitrite NEGATIVE  NEGATIVE    Leukocytes, UA NEGATIVE  NEGATIVE MICROSCOPIC NOT DONE ON URINES WITH NEGATIVE PROTEIN, BLOOD, LEUKOCYTES, NITRITE, OR GLUCOSE <1000 mg/dL.  VANCOMYCIN, TROUGH     Status: Normal   Collection Time   03/05/12  9:10 AM      Component Value Range Comment   Vancomycin Tr 16.2  10.0 - 20.0 ug/mL   LIPASE, BLOOD     Status: Normal   Collection Time   03/05/12  9:10 AM      Component Value Range Comment   Lipase 39  11 - 59 U/L   COMPREHENSIVE METABOLIC PANEL     Status: Abnormal   Collection Time   03/05/12  9:10 AM      Component Value Range Comment   Sodium 134 (*) 135 - 145 mEq/L    Potassium 3.5  3.5 - 5.1 mEq/L    Chloride 94 (*) 96 - 112 mEq/L    CO2 32  19 - 32 mEq/L    Glucose, Bld 105 (*) 70 - 99 mg/dL    BUN 4 (*) 6 - 23 mg/dL    Creatinine, Ser 4.54  0.50 - 1.35 mg/dL    Calcium 8.6  8.4 - 09.8 mg/dL    Total Protein 6.4  6.0 - 8.3 g/dL    Albumin 2.8 (*) 3.5 - 5.2 g/dL    AST 58 (*) 0 - 37 U/L    ALT 69 (*) 0 - 53 U/L    Alkaline Phosphatase 309 (*) 39 - 117 U/L    Total Bilirubin 0.5  0.3 - 1.2 mg/dL    GFR calc non Af Amer >90  >90 mL/min    GFR calc Af Amer >90  >90 mL/min   CBC     Status: Abnormal  Collection Time   03/05/12  9:10 AM      Component Value Range Comment   WBC 8.2  4.0 - 10.5 K/uL    RBC 3.25 (*) 4.22 - 5.81 MIL/uL    Hemoglobin 9.9 (*) 13.0 - 17.0 g/dL    HCT 78.4 (*) 69.6 - 52.0 %    MCV 90.5  78.0 - 100.0 fL    MCH 30.5  26.0 - 34.0 pg    MCHC 33.7  30.0 - 36.0 g/dL    RDW 29.5  28.4 - 13.2 %    Platelets 469 (*) 150 - 400 K/uL     Ct Abdomen Pelvis W Contrast  03/04/2012  *RADIOLOGY REPORT*  Clinical Data: Severe abdominal pain for four or 5 days.  Weakness and fatigue.  CT ABDOMEN AND PELVIS WITH CONTRAST  Technique:  Multidetector CT imaging of the abdomen and pelvis was  performed following the standard protocol during bolus administration of intravenous contrast.  Contrast: OMNIPAQUE IOHEXOL 300 MG/ML  SOLN  Comparison: 10/31/2005  Findings: Small bilateral pleural effusions with basilar atelectasis.  Small pericardial effusion.  Mediastinum is incompletely visualized but there is suggestion of mild prominence of mediastinal and hilar lymph nodes without pathologic enlargement, likely represent reactive nodes.  Suggestion of small stone in the gallbladder neck.  Gallbladder wall is not thickened but there is infiltration in the fat around the head of the pancreas extending to the region of the gallbladder fossa.  No bile duct dilatation.  Changes are suspicious for early acute pancreatitis.  The liver, spleen, adrenal glands, and retroperitoneal lymph nodes are unremarkable.  Mild aortic calcification without aneurysm.  The kidneys demonstrate normal symmetrical nephrograms without hydronephrosis and normal contrast in the collecting system. However, there is loss of distinction of the renal fat bilaterally. This is new since the previous study and no parapelvic cysts were noted previously.  The appearance is nonspecific but suggest an inflammatory process.  Consider pyelonephritis.  There is mild soft tissue stranding around the kidneys and throughout the retroperitoneal fat.  Small accessory spleen.  The stomach and small bowel are decompressed.  Contrast throughout the colon without distension or wall thickening.  No free air or free fluid in the abdomen.  Pelvis:  Prostate gland is not enlarged and contains calcification. The bladder wall is mildly thickened diffusely suggesting cystitis. No free or loculated pelvic fluid collections.  Infiltration in the pelvic fat similar to that seen in the retroperitoneum.  Small right inguinal hernia containing fat.  No evidence of diverticulitis.  The appendix is not identified.  Lipoma in the left gluteus muscles.  Degenerative  changes throughout the lumbar spine.  No destructive bone lesions.  IMPRESSION:  1.  Small bilateral pleural effusions with basilar atelectasis. Small pericardial effusion. 2.  Inflammatory process suggesting cystitis and bilateral pyelonephritis with inflammatory stranding throughout the retroperitoneal and pelvic fat.  There is also stranding around the head of the pancreas with a stone in the neck of the gallbladder. Focal pancreatitis is not excluded.  Original Report Authenticated By: Marlon Pel, M.D.   Dg Chest Port 1 View  03/04/2012  *RADIOLOGY REPORT*  Clinical Data: Acute respiratory failure.  Follow up infiltrate.  PORTABLE CHEST - 1 VIEW  Comparison: 03/02/2012  Findings: Moderate cardiomegaly.  Bilateral diffuse airspace disease has markedly improved.  Vascular congestion persists.  No pneumothorax.  IMPRESSION: Markedly improved bilateral airspace disease.  Original Report Authenticated By: Donavan Burnet, M.D.   Dg Abd Portable  1v  03/04/2012  *RADIOLOGY REPORT*  Clinical Data: Severe abdominal pain, tenderness  PORTABLE ABDOMEN - 1 VIEW  Comparison: Portable exam 1726 hours compared to 02/26/2012  Findings: Normal bowel gas pattern. Scattered air filled loops of nondistended large and small bowel. No bowel dilatation or bowel wall thickening. Mild degenerative disc disease changes lumbar spine. No urinary tract calcification.  IMPRESSION: Normal bowel gas pattern.  Original Report Authenticated By: Lollie Marrow, M.D.    Review of Systems  Constitutional: Negative for fever, chills, weight loss, malaise/fatigue and diaphoresis.  HENT: Negative.   Respiratory: Positive for shortness of breath.   Gastrointestinal: Negative for heartburn, nausea, vomiting, abdominal pain, diarrhea, constipation, blood in stool and melena.  Genitourinary: Negative.   Musculoskeletal: Negative.   Skin: Negative.   Neurological: Negative.  Negative for weakness.  Endo/Heme/Allergies: Negative.     Psychiatric/Behavioral: Negative.    Blood pressure 137/74, pulse 77, temperature 98.8 F (37.1 C), temperature source Oral, resp. rate 18, height 5' 11.5" (1.816 m), weight 244 lb 0.8 oz (110.7 kg), SpO2 91.00%. Physical Exam  Constitutional: He is oriented to person, place, and time. He appears well-developed and well-nourished. No distress.  HENT:  Head: Normocephalic and atraumatic.  Cardiovascular: Normal rate and regular rhythm.   Respiratory: Effort normal and breath sounds normal.  GI: Soft. Bowel sounds are normal. He exhibits no distension and no mass. There is no tenderness. There is no rebound and no guarding.  Neurological: He is alert and oriented to person, place, and time.  Skin: Skin is warm and dry. No rash noted. He is not diaphoretic. No erythema. No pallor.  Psychiatric: He has a normal mood and affect.  Patient denies diet heavy in fatty foods or alcohol consumption. He does smoke. Past surgical history includes appendectomy,hernia repair,knee surgery.  Assessment/Plan: Abdominal pain for 4-5 days.   Abdominal pain most likely related to his gallbladder (recommend further work up; i.e. HIDA scan on an outpatient basis in order to r/o biliary obstruction) This can be done at either our office, or at the [place] whichever is the patient's preference in the near future. This plan was discussed with the patient and his wife in detail by Dr. Corliss Skains; and agreed to by the patient and his wife.   Blenda Mounts 03/05/2012, 2:28 PM   Attending:  Agree with above.  Patient is currently asymptomatic.  Recovering from pneumonia.  Ultrasound negative.  CT question of gallstone in neck of GB.  Recommend HIDA scan, but this can be done as an outpatient.  This will determine whether the patient needs a laparoscopic cholecystectomy.  OK for discharge - low fat diet, outpatient HIDA, [facility].  Wilmon Arms. Corliss Skains, MD, [facility]  03/05/2012 4:09  PM

## 2012-03-05 NOTE — Discharge Summary (Signed)
Physician Discharge Summary  Patient ID: James Fletcher MRN: [id number] DOB/AGE: Mar 27, 1946 66 y.o.  Admit date: 02/26/2012 Discharge date: 03/05/2012  Primary Care Physician: [facility]  Discharge Diagnoses:    .SIRS (systemic inflammatory response syndrome) resolved  .Acute respiratory failure with hypoxia resolved  .Community acquired pneumonia with bilateral pneumonia  .Dehydration with hyponatremia .Abnormal transaminases with possible biliary obstruction, gallstones  .Peripheral neuropathy- unspecified .Left bundle-branch block- chronic dating back to 2007  .Thrombocytopenia .Abdominal pain like secondary to gallstones   .Fever .Leukopenia with low lymphocyte count .Rhabdomyolysis Mild acute on chronic systolic CHF exacerbation possibly due to volume overload  Consults:  Gastroenterology Dr. Evette Cristal                     General surgery, Dr Harlon Flor  Discharge Medications: Medication List  As of 03/05/2012  3:29 PM   TAKE these medications         albuterol 108 (90 BASE) MCG/ACT inhaler   Commonly known as: PROVENTIL HFA;VENTOLIN HFA   Inhale 2 puffs into the lungs every 6 (six) hours as needed for wheezing or shortness of breath.      aspirin 81 MG tablet   Take 81 mg by mouth daily.      carbamazepine 200 MG tablet   Commonly known as: TEGRETOL   Take 1 tablet (200 mg total) by mouth 2 (two) times daily.      furosemide 40 MG tablet   Commonly known as: LASIX   Take 1 tablet (40 mg total) by mouth daily.      gabapentin 400 MG capsule   Commonly known as: NEURONTIN   Take 1,200 mg by mouth 3 (three) times daily.      HYDROcodone-acetaminophen 5-500 MG per tablet   Commonly known as: VICODIN   Take 1 tablet by mouth every 6 (six) hours as needed. pain      levalbuterol 0.63 MG/3ML nebulizer solution   Commonly known as: XOPENEX   Take 3 mLs (0.63 mg total) by nebulization 2 (two) times daily.      levofloxacin 750 MG tablet   Commonly known as: LEVAQUIN     Take 1 tablet (750 mg total) by mouth daily. X 7 DAYS      multivitamin with minerals tablet   Take 1 tablet by mouth daily.      naproxen 500 MG tablet   Commonly known as: NAPROSYN   Take 500 mg by mouth 2 (two) times daily with a meal. Patient uses this medication for pain      omeprazole 20 MG capsule   Commonly known as: PRILOSEC   Take 20 mg by mouth daily. Patient uses this medication for acid reflux.      simethicone 80 MG chewable tablet   Commonly known as: MYLICON   Chew 1 tablet (80 mg total) by mouth 4 (four) times daily as needed for flatulence.      traZODone 50 MG tablet   Commonly known as: DESYREL   Take 200 mg by mouth at bedtime. For sleep      VITAMIN D (CHOLECALCIFEROL) PO   Take 1 tablet by mouth daily.             Brief H and P: For complete details please refer to admission H and P, but in brief Patient is a 66 year old [place] veteran with a past medical history of known peripheral neuropathy, known left bundle branch block who presented to the hospital with fever,  cough, shortness of breath and abdominal pain for 5 days prior to admission.  For 5 days ago he said having cough which was mostly dry and at times had brownish phlegm. He also reported upper abdominal discomfort, fever as high as 103 with occasionally vomiting.  A day before the admission patient had presented to med [facility], weight he was found to have questionable pneumonia and was also found to be leukopenic with significant bandemia. He subsequently signed out AMA and wanted to go to his primary care practitioner at the [place]. He returned again to the emergency room at [facility] [place] next day with worsening of his symptoms. En route, patient was noted to be more fidgety and anxious, he was noted to have more oxygen requirement, as a result he was brought down to the step down unit.    Hospital Course:  66yo male with pmh of LBB and peripheral neuropathy presented to ED on  6/20 with complaints of fever, cough, shortness of breath and abdominal pain x 4-5 days. He was seen initially at [facility] on 6/19 and found to have probable pneumonia and leukopenia. He decided at that time to sign-out AMA. He returned to the ED again on 6/20 with worsening symptoms with associated weakness and fatigue. He described orthopnea. He was referred to [facility] for admission. Enroute to [facility], pt began to have more oxygen requirement leading to admission to SDU. He required BIPAP on 6/23. Patient was transferred out from the step down unit to the floor on 03/03/2012.   Abdominal pain with transaminitis and elevated alkaline phosphatase: Initially was thought to be due to shock liver or sepsis. Sepsis/pneumonia has improved but patient had severe abdominal pain in the evening yesterday and then reported that he has been having abdominal pain after eating recently. Stat lipase was normal, UA was negative for any UTI stat KUB was normal. CT abdomen and pelvis was done which showed evidence of a stone and an echo of the gallbladder, some is stranding around the pancreas and early focal pancreatitis could not be ruled out although lipase is normal, questionable pyelonephritis and cystitis (UA does not show any UTI).  Patient has been on antibiotics vancomycin, cefepime, Zithromax uptill yesterday. Levofloxacin was started yesterday. GI consult was obtained this morning and patient was seen by Dr. Evette Cristal who recommended a Gen. surgery consult and further work up with HIDA scan. Patient was seen by Dr. Corliss Skains who recommended further workup HIDA scan on an outpatient basis in order to rule out biliary obstruction either past [facility] [place] [facility] or at [facility] which ever is the patient's preference. This was discussed in detail with the mainly by Dr. Corliss Skains.   Sepsis with SIRS due to PNA - now resolved with IV fluids and antibiotic treatment   Acute hypoxic respiratory failure due to Community acquired  pneumonia: Resolved patient required BiPAP in the step down unit. Patient was weaned off of oxygen at the time of discharge. He had negative strep, Legionella, and Influenza. Repeat chest x-ray on 03/04/2029 showed markedly improved bilateral airspace disease with persisting vascular congestion. Patient was placed on Lasix due to symptoms of orthopnea, PND, elevated BNP which showed remarkable improvement in his symptoms at rest and ambulation. Patient will complete 7 days of levofloxacin oral.  Thrombocytopenia/ Leukopenia with low lymphocyte count : Improved   Dehydration with hyponatremia: Improved, possibly secondary to volume depletion and pulmonary mediated hyponatremia   Anemia - iron deficiency: No signs of acute  bleeding - Iron level low but so are all Fe indices - this is likely a reflection of his acute illness - will need f/u in the outpt setting   Peripheral neuropathy- unspecified  Has been treated as an outpatient with high-dose Neurontin and Tegretol, recently narcotics added. Tegretol was decreased to 200mg  BID due to transaminitis   Left bundle-branch block: Chronic, 2007 - underwent cardiac catheterization at that time which showed no CAD   Rhabdomyolysis/elevated CPK: resolved   Acute on Chronic systolic HF : Likely precipitated due to volume overload  Per 2Decho done this admit EF 45-50% with diffuse hypokinesis, continue oral Lasix   Day of Discharge BP 137/74  Pulse 77  Temp 98.8 F (37.1 C) (Oral)  Resp 18  Ht 5' 11.5" (1.816 m)  Wt 110.7 kg (244 lb 0.8 oz)  BMI 33.56 kg/m2  SpO2 91%  Physical Exam: General: Alert and awake oriented x3 not in any acute distress. HEENT: anicteric sclera, pupils reactive to light and accommodation CVS: S1-S2 clear no murmur rubs or gallops Chest: clear to auscultation bilaterally, no wheezing rales or rhonchi Abdomen: soft nontender, nondistended, normal bowel sounds, no organomegaly Extremities: no cyanosis, clubbing or  edema noted bilaterally Neuro: Cranial nerves II-XII intact, no focal neurological deficits   The results of significant diagnostics from this hospitalization (including imaging, microbiology, ancillary and laboratory) are listed below for reference.    LAB RESULTS: Basic Metabolic Panel:  Lab 03/05/12 1610 03/04/12 0610  NA 134* 138  K 3.5 3.5  CL 94* 100  CO2 32 30  GLUCOSE 105* 92  BUN 4* 3*  CREATININE 0.64 0.48*  CALCIUM 8.6 8.1*  MG -- --  PHOS -- --   Liver Function Tests:  Lab 03/05/12 0910 03/04/12 0610  AST 58* 59*  ALT 69* 68*  ALKPHOS 309* 293*  BILITOT 0.5 0.5  PROT 6.4 5.6*  ALBUMIN 2.8* 2.4*    Lab 03/05/12 0910 03/04/12 1708  LIPASE 39 57  AMYLASE -- --   No results found for this basename: AMMONIA:2 in the last 168 hours CBC:  Lab 03/05/12 0910 03/04/12 0610 02/28/12 0420  WBC 8.2 9.3 --  NEUTROABS -- -- 4.3  HGB 9.9* 9.2* --  HCT 29.4* 27.3* --  MCV 90.5 -- --  PLT 469* 427* --   Cardiac Enzymes:  Lab 02/28/12 0859  CKTOTAL 593*  CKMB --  CKMBINDEX --  TROPONINI --    Significant Diagnostic Studies:  Dg Chest Port 1 View  02/26/2012  *RADIOLOGY REPORT*  Clinical Data: Pulmonary infiltrate.  Shortness of breath.  Chest pain.  PORTABLE CHEST - 1 VIEW  Comparison: 06/20 and 02/25/2012  Findings: There is a persistent hazy right perihilar infiltrate in the right upper lobe.  There is also persistent peribronchial thickening in the right lower lobe.  Heart size and vascularity are normal.  Left lung is clear except for peribronchial thickening.  IMPRESSION: Persistent hazy infiltrate in the right upper lobe with bilateral bronchitic changes.  Original Report Authenticated By: Gwynn Burly, M.D.   Dg Chest Portable 1 View  02/26/2012  *RADIOLOGY REPORT*  Clinical Data: Fever  PORTABLE CHEST - 1 VIEW  Comparison: 02/25/2012  Findings: Degraded by hypoaeration.  Interstitial prominence.  Mild right suprahilar and lung base opacities.  Mild  cardiomegaly.  Mild central vascular congestion.  No pneumothorax.  No definite pleural effusion.  No acute osseous finding.  IMPRESSION: Interstitial prominence, bibasilar and asymmetric right suprahilar airspace opacities. May reflect pneumonia  and/or edema. Allowing for differences in technique, similar to slightly worsened in the interval.  Original Report Authenticated By: Waneta Martins, M.D.   Dg Abd Portable 1v  02/26/2012  *RADIOLOGY REPORT*  Clinical Data: Shortness of breath, chest pain, abdominal pain and pressure  PORTABLE ABDOMEN - 1 VIEW  Comparison: Portable exam 1740 hours without priors for comparison.  Findings: Nonobstructive bowel gas pattern. No bowel dilatation or bowel wall thickening. Scattered gas within large and small bowel loops. Mild endplate spur formation lumbar spine. Osseous demineralization. No urinary tract calcification.  IMPRESSION: Nonobstructive bowel gas pattern.  Original Report Authenticated By: Lollie Marrow, M.D.     Disposition and Follow-up: Discharge Orders    Future Orders Please Complete By Expires   Diet - low sodium heart healthy      Increase activity slowly          DISPOSITION: Home  DIET: Heart healthy diet ACTIVITY: As tolerated  DISCHARGE FOLLOW-UP Follow-up Information    [facility] Schedule an appointment as soon as possible for a visit in 10 days. (Patient is adviased to follow up eitherwith his [place] physician or he may contact our office for a follow-up appointment with Dr. Corliss Skains as discussed with him and his wife.)       Follow up with Wynona Luna., MD. Schedule an appointment as soon as possible for a visit in 10 days. (if prefered to pursue HIDA scan and further f/u)    Contact information:   [facility] [address], [address] [place] [address] [phone]          Time spent on Discharge: 45 minutes  Signed:   Ndidi Nesby M.D. [facility] 03/05/2012, 3:29 PM Pager: [phone]  If 7PM-7AM, please contact night-coverage [email or web address] Password [email or web address]

## 2012-03-05 NOTE — Progress Notes (Signed)
Pt d/c in stable condition. O2 sat 94% on RA. No SOB. Instructions thoroughly reviewed with both patient and wife. Instructions for medications reviewed as well as importance of follow up appointments. [facility] Pt received nebulizer prior to discharge.

## 2012-03-05 NOTE — Progress Notes (Signed)
Patient ID: James Fletcher  male  ZOX:[id number]    DOB: Mar 30, 1946    DOA: 02/26/2012  PCP: No primary provider on file.  Interim history 66yo male with pmh of LBB and peripheral neuropathy presented to ED on 6/20 with complaints of fever, cough, shortness of breath and abdominal pain x 4-5 days. He was seen initially at [facility] on 6/19 and found to have probable pneumonia and leukopenia. He decided at that time to sign-out AMA. He returned to the ED again on 6/20 with worsening symptoms with associated weakness and fatigue. He described orthopnea. He was referred to [facility] for admission. Enroute to [facility], pt began to have more oxygen requirement leading to admission to SDU. He required BIPAP on 6/23. Patient was transferred out from the step down unit to the floor on 03/03/2012.     Subjective: Orthopnea and dyspnea on exertion significantly improved after starting Lasix. Home O2 evaluation done, O2 sats remained in 90s. Patient's abdominal pain improved this morning however has been recently going on after eating.  Objective: Weight change:   Intake/Output Summary (Last 24 hours) at 03/05/12 1237 Last data filed at 03/05/12 0400  Gross per 24 hour  Intake   1539 ml  Output   2325 ml  Net   -786 ml   Blood pressure 128/59, pulse 71, temperature 98.1 F (36.7 C), temperature source Oral, resp. rate 18, height 5' 11.5" (1.816 m), weight 110.7 kg (244 lb 0.8 oz), SpO2 98.00%.  Physical Exam: General: Alert and awake, oriented x3, not in any acute distress. HEENT: anicteric sclera, pupils reactive to light and accommodation, EOMI CVS: S1-S2 clear, no murmur rubs or gallops Chest: Fine crackles at the bases, no wheezing  Abdomen: soft mild right upper quadrant tenderness , nondistended, normal bowel sounds, no organomegaly Extremities: no cyanosis, clubbing, trace edema bilaterally  Neuro: Cranial nerves II-XII intact, no focal neurological deficits  Lab Results: Basic Metabolic  Panel:  Lab 03/05/12 0910 03/04/12 0610  NA 134* 138  K 3.5 3.5  CL 94* 100  CO2 32 30  GLUCOSE 105* 92  BUN 4* 3*  CREATININE 0.64 0.48*  CALCIUM 8.6 8.1*  MG -- --  PHOS -- --   Liver Function Tests:  Lab 03/05/12 0910 03/04/12 0610  AST 58* 59*  ALT 69* 68*  ALKPHOS 309* 293*  BILITOT 0.5 0.5  PROT 6.4 5.6*  ALBUMIN 2.8* 2.4*    Lab 03/05/12 0910 03/04/12 1708  LIPASE 39 57  AMYLASE -- --   CBC:  Lab 03/05/12 0910 03/04/12 0610 02/28/12 0420  WBC 8.2 9.3 --  NEUTROABS -- -- 4.3  HGB 9.9* 9.2* --  HCT 29.4* 27.3* --  MCV 90.5 91.0 --  PLT 469* 427* --   Cardiac Enzymes:  Lab 02/28/12 0859  CKTOTAL 593*  CKMB --  CKMBINDEX --  TROPONINI --     Micro Results: Recent Results (from the past 240 hour(s))  CULTURE, BLOOD (ROUTINE X 2)     Status: Normal   Collection Time   02/26/12  5:50 PM      Component Value Range Status Comment   Specimen Description BLOOD HAND LEFT   Final    Special Requests BOTTLES DRAWN AEROBIC AND ANAEROBIC 10CC   Final    Culture  Setup Time [id number]   Final    Culture NO GROWTH 5 DAYS   Final    Report Status 03/04/2012 FINAL   Final   CULTURE, BLOOD (ROUTINE X 2)  Status: Normal   Collection Time   02/26/12  5:55 PM      Component Value Range Status Comment   Specimen Description BLOOD HAND LEFT   Final    Special Requests BOTTLES DRAWN AEROBIC ONLY 5CC   Final    Culture  Setup Time [id number]   Final    Culture NO GROWTH 5 DAYS   Final    Report Status 03/04/2012 FINAL   Final   MRSA PCR SCREENING     Status: Normal   Collection Time   02/26/12  6:17 PM      Component Value Range Status Comment   MRSA by PCR NEGATIVE  NEGATIVE Final   CULTURE, EXPECTORATED SPUTUM-ASSESSMENT     Status: Normal   Collection Time   02/27/12  8:43 AM      Component Value Range Status Comment   Specimen Description SPUTUM   Final    Special Requests NONE   Final    Sputum evaluation     Final    Value: MICROSCOPIC FINDINGS  SUGGEST THAT THIS SPECIMEN IS NOT REPRESENTATIVE OF LOWER RESPIRATORY SECRETIONS. PLEASE RECOLLECT.     CALLED TO B BRADT, RN 02/27/12 1000 BY K SCHULTZ   Report Status 02/27/2012 FINAL   Final   URINE CULTURE     Status: Normal   Collection Time   02/27/12 11:31 AM      Component Value Range Status Comment   Specimen Description URINE, CLEAN CATCH   Final    Special Requests NONE   Final    Culture  Setup Time [id number]   Final    Colony Count NO GROWTH   Final    Culture NO GROWTH   Final    Report Status 02/28/2012 FINAL   Final   CLOSTRIDIUM DIFFICILE BY PCR     Status: Normal   Collection Time   02/27/12  7:01 PM      Component Value Range Status Comment   C difficile by pcr NEGATIVE  NEGATIVE Final     Studies/Results: Dg Chest 2 View  02/26/2012  *RADIOLOGY REPORT*  Clinical Data: Chest congestion.  Cough.  Smoker.  Epigastric pain.  CHEST - 2 VIEW  Comparison: 10/31/2005  Findings: Central peribronchial thickening is noted bilaterally. Asymmetric airspace disease is seen in the central right upper lobe, suspicious for pneumonia.  No evidence of pleural effusion. No mass or lymphadenopathy identified.  Heart size is within normal limits.  IMPRESSION: Asymmetric airspace disease and central right upper lobe, suspicious for pneumonia. Post-treatment  radiographic followup recommended to confirm resolution.  Original Report Authenticated By: Danae Orleans, M.D.   US Abdomen Complete  02/26/2012  *RADIOLOGY REPORT*  Clinical Data:  Epigastric abdominal pain.  Elevated liver function tests.  ABDOMINAL ULTRASOUND COMPLETE  Comparison:  CT on 10/31/2005  Findings:  Gallbladder:  No gallstones, gallbladder wall thickening, or pericholecystic fluid.  Common Bile Duct:  Within normal limits in caliber. Measures 2 mm in diameter.  Liver: Within normal limits in parenchymal echogenicity.  A 1.9 cm hyperechoic mass is seen in the left hepatic lobe which is consistent with a benign hemangioma and  stable compared with previous CT.  No other liver masses are identified.  IVC:  Appears normal.  Pancreas:  Not well visualized due to overlying bowel gas.  Spleen:  Within normal limits in size and echotexture.  Right kidney:  Normal in size and parenchymal echogenicity.  No evidence of mass or hydronephrosis.  Left kidney:  Normal in size and parenchymal echogenicity.  No evidence of mass or hydronephrosis.  Abdominal Aorta:  No aneurysm identified.  IMPRESSION:  1.  No evidence of gallstones, biliary dilatation, or other acute findings. 2.  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   Dg Chest Port 1 View  03/04/2012  *RADIOLOGY REPORT*  Clinical Data: Acute respiratory failure.  Follow up infiltrate.  PORTABLE CHEST - 1 VIEW  Comparison: 03/02/2012  Findings: Moderate cardiomegaly.  Bilateral diffuse airspace disease has markedly improved.  Vascular congestion persists.  No pneumothorax.  IMPRESSION: Markedly improved bilateral airspace disease.  Original Report Authenticated By: Donavan Burnet, M.D.   Dg Chest Port 1 View  03/02/2012  *RADIOLOGY REPORT*  Clinical Data: Follow-up infiltrates.  PORTABLE CHEST - 1 VIEW  Comparison: 02/26/2012  Findings: Bilateral airspace opacities have progressed since prior study.  There is cardiomegaly.  No visible effusions.  No acute bony abnormality  IMPRESSION: Worsening diffuse bilateral interstitial and alveolar opacities. This could represent edema or infection.  Original Report Authenticated By: Cyndie Chime, M.D.   Dg Chest Port 1 View  02/26/2012  *RADIOLOGY REPORT*  Clinical Data: Pulmonary infiltrate.  Shortness of breath.  Chest pain.  PORTABLE CHEST - 1 VIEW  Comparison: 06/20 and 02/25/2012  Findings: There is a persistent hazy right perihilar infiltrate in the right upper lobe.  There is also persistent peribronchial thickening in the  right lower lobe.  Heart size and vascularity are normal.  Left lung is clear except for peribronchial thickening.  IMPRESSION: Persistent hazy infiltrate in the right upper lobe with bilateral bronchitic changes.  Original Report Authenticated By: Gwynn Burly, M.D.   Dg Chest Portable 1 View  02/26/2012  *RADIOLOGY REPORT*  Clinical Data: Fever  PORTABLE CHEST - 1 VIEW  Comparison: 02/25/2012  Findings: Degraded by hypoaeration.  Interstitial prominence.  Mild right suprahilar and lung base opacities.  Mild cardiomegaly.  Mild central vascular congestion.  No pneumothorax.  No definite pleural effusion.  No acute osseous finding.  IMPRESSION: Interstitial prominence, bibasilar and asymmetric right suprahilar airspace opacities. May reflect pneumonia and/or edema. Allowing for differences in technique, similar to slightly worsened in the interval.  Original Report Authenticated By: Waneta Martins, M.D.   Dg Abd Portable 1v  02/26/2012  *RADIOLOGY REPORT*  Clinical Data: Shortness of breath, chest pain, abdominal pain and pressure  PORTABLE ABDOMEN - 1 VIEW  Comparison: Portable exam 1740 hours without priors for comparison.  Findings: Nonobstructive bowel gas pattern. No bowel dilatation or bowel wall thickening. Scattered gas within large and small bowel loops. Mild endplate spur formation lumbar spine. Osseous demineralization. No urinary tract calcification.  IMPRESSION: Nonobstructive bowel gas pattern.  Original Report Authenticated By: Lollie Marrow, M.D.    Medications: Scheduled Meds:    . aspirin  81 mg Oral Daily  . bismuth subsalicylate  30 mL Oral Q6H  . carbamazepine  200 mg Oral BID  . enoxaparin (LOVENOX) injection  40 mg Subcutaneous q1800  . furosemide  40 mg Intravenous Q12H  . furosemide  40 mg Oral Daily  . gabapentin  1,200 mg Oral TID  . guaiFENesin-dextromethorphan  5 mL Oral Q6H  . iohexol  20 mL Oral Q1 Hr x 2  . ipratropium  0.5 mg Nebulization QID  .  levalbuterol  0.63 mg Nebulization QID  . levofloxacin  750 mg Oral Daily  .  pantoprazole  40 mg Oral Q supper  . sodium chloride  3 mL Intravenous Q12H  . DISCONTD: carbamazepine  400 mg Oral BID  . DISCONTD: levofloxacin (LEVAQUIN) IV  750 mg Intravenous Q24H  . DISCONTD: vancomycin  1,000 mg Intravenous Q8H   Continuous Infusions:    Assessment/Plan:  Abdominal pain with transaminitis and elevated alkaline phosphatase: Initially was thought to be due to shock liver or sepsis. Sepsis/pneumonia has improved but patient had severe abdominal pain in the evening yesterday and reports that he has been having abdominal pain after eating recently. - Stat lipase was normal, UA was negative for any UTI stat KUB was normal - CT abdomen and pelvis was done which showed evidence of a stone and an echo of the gallbladder, some is stranding around the pancreas and early focal pancreatitis could not be ruled out although lipase is normal, questionable pyelonephritis and cystitis (UA does not show any UTI) - Patient has been on antibiotics vancomycin, cefepime, Zithromax uptill yesterday. Levofloxacin was started yesterday.  - GI consult was obtained this morning, highly appreciate recommendations. General surgery consult has been called as well, will follow recommendations.  Sepsis with SIRS due to PNA - now resolved with IV fluids and antibiotic treatment   Acute hypoxic respiratory failure due to Community acquired pneumonia:  - Off BiPAP, however still requiring O2 via nasal cannula 3 L, wean as tolerated - negative strep, Legionella, and Influenza - repeat chest x-ray on 03/04/2029 showed markedly improved bilateral airspace disease with persisting vascular congestion.  - Transitioned to oral Lasix today  - DC'd vancomycin today, continue levofloxacin only  - Patient did very well on home O2 evaluation, does not require any oxygen supplementation  Thrombocytopenia/ Leukopenia with low lymphocyte  count : Improved  Dehydration with hyponatremia: Improved, possibly secondary to volume depletion and pulmonary mediated hyponatremia   Anemia - iron deficiency  No signs of acute bleeding - Iron level low but so are all Fe indices - this is likely a reflection of his acute illness - will need f/u in the outpt setting   Peripheral neuropathy- unspecified  Has been treated as an outpatient with high-dose Neurontin and Tegretol, recently narcotics added  Left bundle-branch block: Chronic, 2007 - underwent cardiac catheterization at that time which showed no CAD   Rhabdomyolysis/elevated CPK: resolved   Acute on Chronic systolic HF : Likely precipitated due to volume overload Per 2Decho done this admit EF 45-50% with diffuse hypokinesis, continue Lasix, strict I.'s and O.'s.   DVT Prophylaxis: Lovenox  Code Status: Full code  Disposition: Awaiting general surgery recommendations.   LOS: 8 days   Crystalina Stodghill M.D. [facility] 03/05/2012, 12:37 PM Pager: [phone]  If 7PM-7AM, please contact night-coverage [email or web address] Password [email or web address]

## 2012-03-06 LAB — URINE CULTURE

## 2013-06-16 ENCOUNTER — Encounter: Payer: Self-pay | Admitting: Physician Assistant

## 2013-06-16 ENCOUNTER — Ambulatory Visit (INDEPENDENT_AMBULATORY_CARE_PROVIDER_SITE_OTHER): Payer: Self-pay | Admitting: Physician Assistant

## 2013-06-16 VITALS — BP 152/90 | HR 76 | Temp 97.9°F | Resp 18 | Ht 68.5 in | Wt 218.0 lb

## 2013-06-16 DIAGNOSIS — R3915 Urgency of urination: Secondary | ICD-10-CM | POA: Diagnosis not present

## 2013-06-16 DIAGNOSIS — N400 Enlarged prostate without lower urinary tract symptoms: Secondary | ICD-10-CM

## 2013-06-16 LAB — URINALYSIS, ROUTINE W REFLEX MICROSCOPIC
Glucose, UA: NEGATIVE mg/dL
Hgb urine dipstick: NEGATIVE
Ketones, ur: NEGATIVE mg/dL
Leukocytes, UA: NEGATIVE
pH: 7 (ref 5.0–8.0)

## 2013-06-16 MED ORDER — TAMSULOSIN HCL 0.4 MG PO CAPS: 0.4000 mg | ORAL_CAPSULE | Freq: Every day | ORAL | Status: DC

## 2013-06-16 NOTE — Progress Notes (Signed)
Patient ID: James Fletcher MRN: [id number], DOB: [birth year], 67 y.o. Date of Encounter: 06/16/2013, 12:42 PM    Chief Complaint:  Chief Complaint  Patient presents with  . other    prostate problem,  urinary freq, discomfort, urgency     HPI: 67 y.o. year old white male is being seen as a new patient to the office today. Says that he usually goes to the [place] for his medical care. Says he actually went back to the [place] about this current problem recently but they were about 100 patients in the waiting room so he ended up leaving and coming here instead.  As well, he says that the [place] did a prostate exam and PSA lab level just around 2 months ago. Says that this was normal.  He comes in today with complaints of urinary frequency. He says that he goes to the bathroom about every 30 minutes at night.  However he then adds that he has problems with PTSD and does not sleep well secondary to this. Therefore not sure how much of this nocturia secondary to a true urine/prostate issue versus being awake in the night secondary to the PTSD.  He alsotes that the force of his strain is decreased about 50%. Also reports that he urinates very small volume. Also reports positive hesitancy. However, reports no dribbling. He has no dysuria and no pelvic pain. Has had no fevers or chills.  Home Meds: See attached medication section for any medications that were entered at today's visit. The computer does not put those onto this list.The following list is a list of meds entered prior to today's visit.   Current Outpatient Prescriptions on File Prior to Visit  Medication Sig Dispense Refill  . albuterol (PROVENTIL HFA;VENTOLIN HFA) 108 (90 BASE) MCG/ACT inhaler Inhale 2 puffs into the lungs every 6 (six) hours as needed for wheezing or shortness of breath.  1 Inhaler  2  . aspirin 81 MG tablet Take 81 mg by mouth daily.      . carbamazepine (TEGRETOL) 200 MG tablet Take 1 tablet (200 mg total) by mouth 2  (two) times daily.  60 tablet  1  . gabapentin (NEURONTIN) 400 MG capsule Take 1,200 mg by mouth 3 (three) times daily.       Marland Kitchen HYDROcodone-acetaminophen (VICODIN) 5-500 MG per tablet Take 1 tablet by mouth every 6 (six) hours as needed. pain      . Multiple Vitamins-Minerals (MULTIVITAMIN WITH MINERALS) tablet Take 1 tablet by mouth daily.      . naproxen (NAPROSYN) 500 MG tablet Take 500 mg by mouth 2 (two) times daily with a meal. Patient uses this medication for pain      . traZODone (DESYREL) 50 MG tablet Take 200 mg by mouth at bedtime. For sleep      . VITAMIN D, CHOLECALCIFEROL, PO Take 1 tablet by mouth daily.      . furosemide (LASIX) 40 MG tablet Take 1 tablet (40 mg total) by mouth daily.  30 tablet  3  . levalbuterol (XOPENEX) 0.63 MG/3ML nebulizer solution Take 3 mLs (0.63 mg total) by nebulization 2 (two) times daily.  3 mL  3  . omeprazole (PRILOSEC) 20 MG capsule Take 20 mg by mouth daily. Patient uses this medication for acid reflux.      . simethicone (MYLICON) 80 MG chewable tablet Chew 1 tablet (80 mg total) by mouth 4 (four) times daily as needed for flatulence.  60 tablet  3  No current facility-administered medications on file prior to visit.    Allergies: No Known Allergies    Review of Systems: See HPI for pertinent ROS. All other ROS negative.    Physical Exam: Blood pressure 152/90, pulse 76, temperature 97.9 F (36.6 C), temperature source Oral, resp. rate 18, height 5' 8.5" (1.74 m), weight 218 lb (98.884 kg)., Body mass index is 32.66 kg/(m^2). General:  Alert well-developed white male .ppears in no acute distress. Lungs: Clear bilaterally to auscultation without wheezes, rales, or rhonchi. Breathing is unlabored. Heart: Regular rhythm. No murmurs, rubs, or gallops. Abdomen: Soft, non-tender, non-distended with normoactive bowel sounds. No hepatomegaly. No rebound/guarding. No obvious abdominal masses. Rectal exam: Normal. Prostate gland is normal with no  tenderness and no nodularity. Msk:  Strength and tone normal for age. Extremities/Skin: Warm and dry. No clubbing or cyanosis. No edema. No rashes or suspicious lesions. Neuro: Alert and oriented X 3. Moves all extremities spontaneously. Gait is normal. CNII-XII grossly in tact. Psych:  Responds to questions appropriately with a normal affect.   Results for orders placed in visit on 06/16/13  URINALYSIS, ROUTINE W REFLEX MICROSCOPIC      Result Value Range   Color, Urine YELLOW  YELLOW   APPearance CLEAR  CLEAR   Specific Gravity, Urine 1.020  1.005 - 1.030   pH 7.0  5.0 - 8.0   Glucose, UA NEG  NEG mg/dL   Bilirubin Urine NEG  NEG   Ketones, ur NEG  NEG mg/dL   Hgb urine dipstick NEG  NEG   Protein, ur NEG  NEG mg/dL   Urobilinogen, UA 0.2  0.0 - 1.0 mg/dL   Nitrite NEG  NEG   Leukocytes, UA NEG  NEG    Recent PSA at the [facility] was normal.  ASSESSMENT AND PLAN:  67 y.o. year old male with  1. BPH (benign prostatic hyperplasia) - tamsulosin (FLOMAX) 0.4 MG CAPS capsule; Take 1 capsule (0.4 mg total) by mouth daily.  Dispense: 30 capsule; Refill: 11  2. Urgency of urination - Urinalysis, Routine w reflex microscopic  Followup if symptoms do not resolve with the use of Flomax.  [address] [place], [place], [facility] [place] [facility] 06/16/2013 12:42 PM

## 2013-11-23 IMAGING — CR DG CHEST 1V PORT
1 series · 1 of 1 positions shown · non-contrast
Comparison: 02/26/2012

CLINICAL DATA: Follow-up infiltrates.

PORTABLE CHEST - 1 VIEW

[PA]
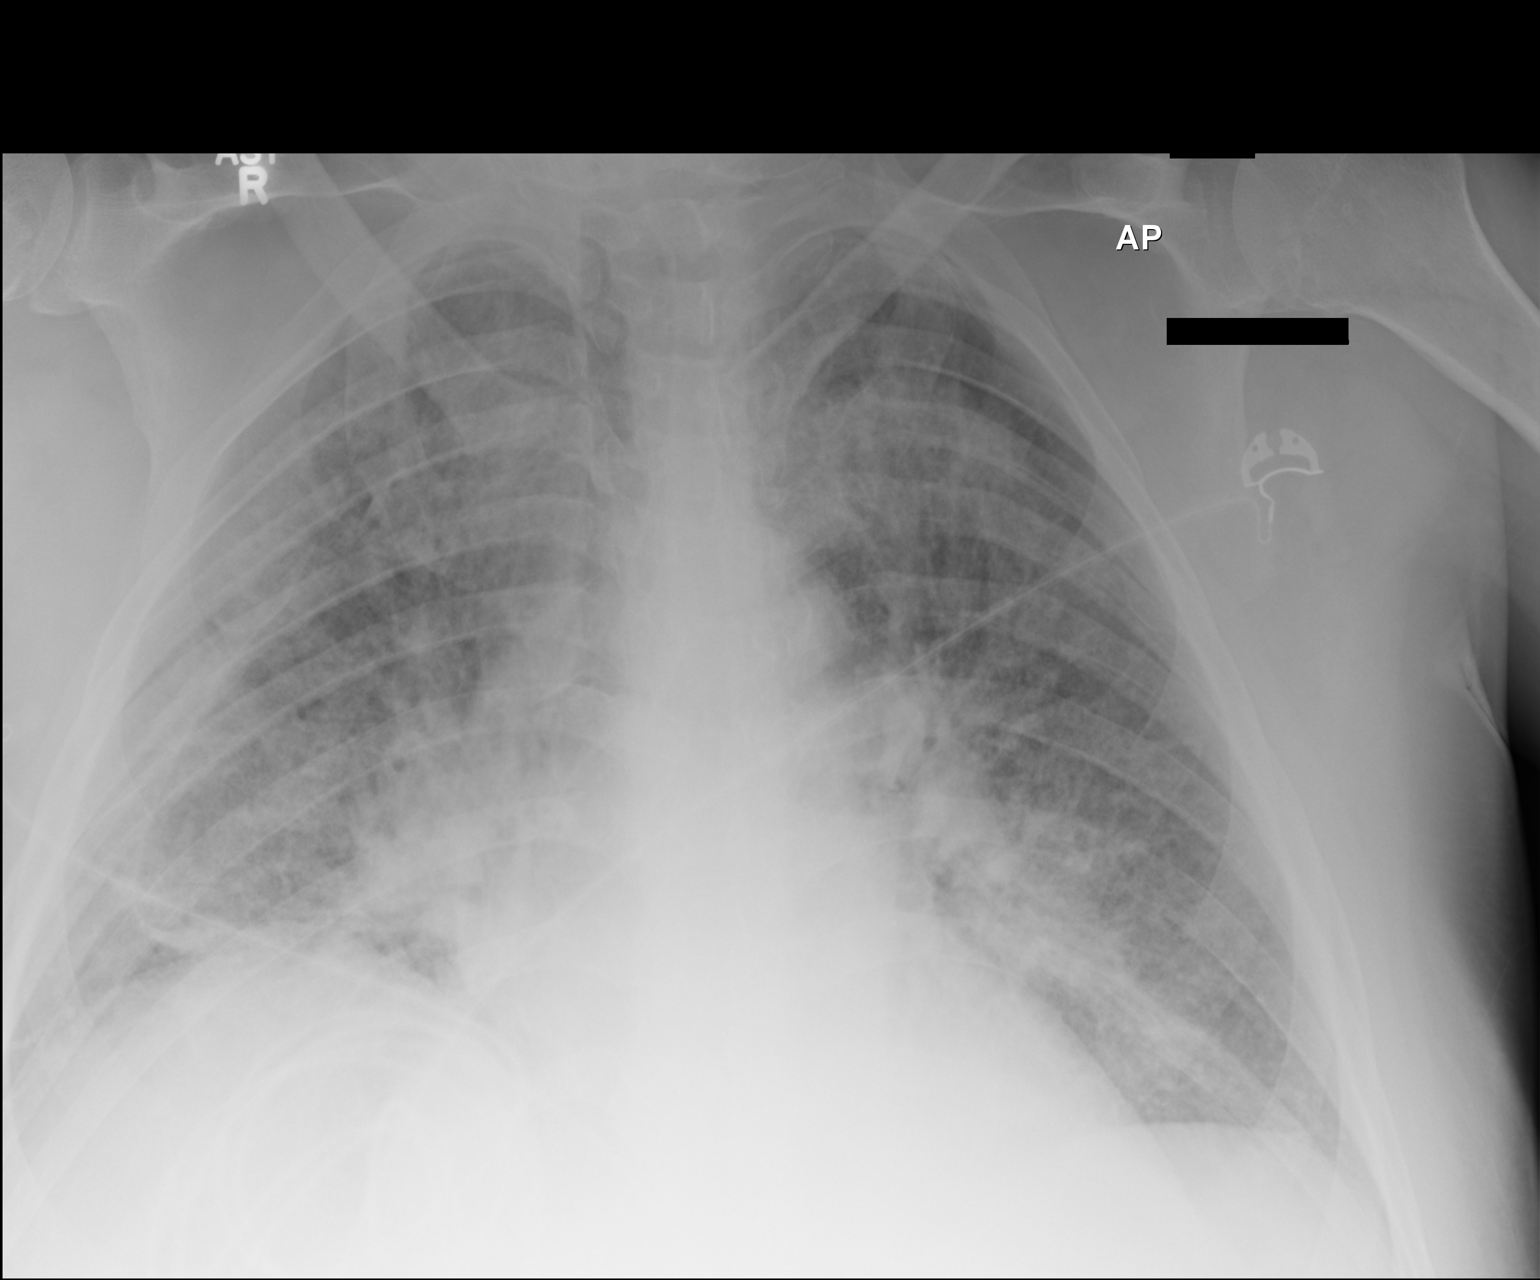

[1 of 1 positions shown; findings below may reference images not displayed]

FINDINGS: Bilateral airspace opacities have progressed since prior
study.  There is cardiomegaly.  No visible effusions.  No acute
bony abnormality
IMPRESSION: Worsening diffuse bilateral interstitial and alveolar opacities.
This could represent edema or infection.

## 2014-05-01 ENCOUNTER — Encounter (HOSPITAL_COMMUNITY): Payer: Medicare Other | Admitting: Anesthesiology

## 2014-05-01 ENCOUNTER — Encounter (HOSPITAL_COMMUNITY): Admission: EM | Disposition: A | Discharge status: Home / Self Care | Payer: Self-pay | Source: Home / Self Care

## 2014-05-01 ENCOUNTER — Emergency Department (HOSPITAL_COMMUNITY): Payer: Medicare Other

## 2014-05-01 ENCOUNTER — Observation Stay (HOSPITAL_COMMUNITY): Payer: Medicare Other | Admitting: Anesthesiology

## 2014-05-01 ENCOUNTER — Inpatient Hospital Stay (HOSPITAL_COMMUNITY)
Admission: EM | Admit: 2014-05-01 | Discharge: 2014-05-03 | DRG: 419 | Disposition: A | Discharge status: Home / Self Care | Payer: Medicare Other | Attending: General Surgery | Admitting: General Surgery

## 2014-05-01 ENCOUNTER — Encounter (HOSPITAL_COMMUNITY): Payer: Self-pay | Admitting: Emergency Medicine

## 2014-05-01 DIAGNOSIS — Z79899 Other long term (current) drug therapy: Secondary | ICD-10-CM | POA: Diagnosis not present

## 2014-05-01 DIAGNOSIS — K810 Acute cholecystitis: Secondary | ICD-10-CM | POA: Diagnosis not present

## 2014-05-01 DIAGNOSIS — K8010 Calculus of gallbladder with chronic cholecystitis without obstruction: Secondary | ICD-10-CM | POA: Diagnosis present

## 2014-05-01 DIAGNOSIS — Z7982 Long term (current) use of aspirin: Secondary | ICD-10-CM

## 2014-05-01 DIAGNOSIS — Z791 Long term (current) use of non-steroidal anti-inflammatories (NSAID): Secondary | ICD-10-CM

## 2014-05-01 DIAGNOSIS — R1013 Epigastric pain: Secondary | ICD-10-CM | POA: Diagnosis not present

## 2014-05-01 DIAGNOSIS — F17200 Nicotine dependence, unspecified, uncomplicated: Secondary | ICD-10-CM | POA: Diagnosis present

## 2014-05-01 DIAGNOSIS — R109 Unspecified abdominal pain: Secondary | ICD-10-CM | POA: Diagnosis not present

## 2014-05-01 DIAGNOSIS — K8000 Calculus of gallbladder with acute cholecystitis without obstruction: Secondary | ICD-10-CM | POA: Diagnosis present

## 2014-05-01 DIAGNOSIS — K819 Cholecystitis, unspecified: Secondary | ICD-10-CM | POA: Diagnosis not present

## 2014-05-01 DIAGNOSIS — G609 Hereditary and idiopathic neuropathy, unspecified: Secondary | ICD-10-CM | POA: Diagnosis present

## 2014-05-01 HISTORY — PX: CHOLECYSTECTOMY: SHX55

## 2014-05-01 HISTORY — DX: Cholecystitis, unspecified: K81.9

## 2014-05-01 LAB — COMPREHENSIVE METABOLIC PANEL
ALBUMIN: 3.7 g/dL (ref 3.5–5.2)
ALT: 10 U/L (ref 0–53)
AST: 13 U/L (ref 0–37)
Alkaline Phosphatase: 89 U/L (ref 39–117)
Anion gap: 12 (ref 5–15)
BUN: 11 mg/dL (ref 6–23)
CALCIUM: 9 mg/dL (ref 8.4–10.5)
CO2: 24 mEq/L (ref 19–32)
CREATININE: 0.59 mg/dL (ref 0.50–1.35)
Chloride: 98 mEq/L (ref 96–112)
GFR calc Af Amer: >90 mL/min (ref >90)
GFR calc non Af Amer: >90 mL/min (ref >90)
Glucose, Bld: 119 mg/dL — ABNORMAL HIGH (ref 70–99)
Potassium: 3.4 mEq/L — ABNORMAL LOW (ref 3.7–5.3)
SODIUM: 134 meq/L — AB (ref 137–147)
TOTAL PROTEIN: 6.4 g/dL (ref 6.0–8.3)
Total Bilirubin: <0.2 mg/dL — ABNORMAL LOW (ref 0.3–1.2)

## 2014-05-01 LAB — CBC WITH DIFFERENTIAL/PLATELET
BASOS ABS: 0 10*3/uL (ref 0.0–0.1)
Basophils Relative: 0 % (ref 0–1)
EOS ABS: 0.1 10*3/uL (ref 0.0–0.7)
EOS PCT: 1 % (ref 0–5)
HCT: 36 % — ABNORMAL LOW (ref 39.0–52.0)
Hemoglobin: 12.6 g/dL — ABNORMAL LOW (ref 13.0–17.0)
LYMPHS ABS: 1.8 10*3/uL (ref 0.7–4.0)
Lymphocytes Relative: 20 % (ref 12–46)
MCH: 30.7 pg (ref 26.0–34.0)
MCHC: 35 g/dL (ref 30.0–36.0)
MCV: 87.8 fL (ref 78.0–100.0)
Monocytes Absolute: 0.6 10*3/uL (ref 0.1–1.0)
Monocytes Relative: 7 % (ref 3–12)
NEUTROS PCT: 72 % (ref 43–77)
Neutro Abs: 6.4 10*3/uL (ref 1.7–7.7)
PLATELETS: 214 10*3/uL (ref 150–400)
RBC: 4.1 MIL/uL — AB (ref 4.22–5.81)
RDW: 12.6 % (ref 11.5–15.5)
WBC: 8.9 10*3/uL (ref 4.0–10.5)

## 2014-05-01 LAB — LIPASE, BLOOD: LIPASE: 36 U/L (ref 11–59)

## 2014-05-01 LAB — I-STAT TROPONIN, ED: TROPONIN I, POC: 0 ng/mL (ref 0.00–0.08)

## 2014-05-01 LAB — SURGICAL PCR SCREEN
MRSA, PCR: NEGATIVE
Staphylococcus aureus: NEGATIVE

## 2014-05-01 SURGERY — LAPAROSCOPIC CHOLECYSTECTOMY
Anesthesia: General

## 2014-05-01 MED ORDER — CIPROFLOXACIN IN D5W 400 MG/200ML IV SOLN
400.0000 mg | Freq: Two times a day (BID) | INTRAVENOUS | Status: DC
  Administered 2014-05-01 – 2014-05-03 (×4): 400 mg via INTRAVENOUS
  Filled 2014-05-01 (×6): qty 200

## 2014-05-01 MED ORDER — OXYCODONE HCL 5 MG PO TABS: 5.0000 mg | ORAL_TABLET | Freq: Once | ORAL | Status: DC | PRN

## 2014-05-01 MED ORDER — METOCLOPRAMIDE HCL 5 MG/ML IJ SOLN
10.0000 mg | INTRAMUSCULAR | Status: AC
  Administered 2014-05-01: 10 mg via INTRAVENOUS
  Filled 2014-05-01: qty 2

## 2014-05-01 MED ORDER — NEOSTIGMINE METHYLSULFATE 10 MG/10ML IV SOLN
INTRAVENOUS | Status: DC | PRN
  Administered 2014-05-01: 4 mg via INTRAVENOUS

## 2014-05-01 MED ORDER — ROCURONIUM BROMIDE 100 MG/10ML IV SOLN
INTRAVENOUS | Status: DC | PRN
  Administered 2014-05-01 (×2): 10 mg via INTRAVENOUS
  Administered 2014-05-01: 50 mg via INTRAVENOUS
  Administered 2014-05-01: 20 mg via INTRAVENOUS
  Administered 2014-05-01 (×2): 10 mg via INTRAVENOUS

## 2014-05-01 MED ORDER — FENTANYL CITRATE 0.05 MG/ML IJ SOLN
INTRAMUSCULAR | Status: DC | PRN
  Administered 2014-05-01: 100 ug via INTRAVENOUS
  Administered 2014-05-01 (×3): 50 ug via INTRAVENOUS

## 2014-05-01 MED ORDER — FAMOTIDINE IN NACL 20-0.9 MG/50ML-% IV SOLN
20.0000 mg | Freq: Once | INTRAVENOUS | Status: AC
  Administered 2014-05-01: 20 mg via INTRAVENOUS
  Filled 2014-05-01: qty 50

## 2014-05-01 MED ORDER — HYDROMORPHONE HCL PF 1 MG/ML IJ SOLN
1.0000 mg | Freq: Once | INTRAMUSCULAR | Status: AC
  Administered 2014-05-01: 1 mg via INTRAVENOUS
  Filled 2014-05-01: qty 1

## 2014-05-01 MED ORDER — SODIUM CHLORIDE 0.9 % IR SOLN
Status: DC | PRN
  Administered 2014-05-01: 2000 mL

## 2014-05-01 MED ORDER — ROCURONIUM BROMIDE 50 MG/5ML IV SOLN
INTRAVENOUS | Status: AC
  Filled 2014-05-01: qty 1

## 2014-05-01 MED ORDER — BUPIVACAINE-EPINEPHRINE (PF) 0.25% -1:200000 IJ SOLN
INTRAMUSCULAR | Status: AC
  Filled 2014-05-01: qty 30

## 2014-05-01 MED ORDER — TRAZODONE HCL 50 MG PO TABS: 200.0000 mg | ORAL_TABLET | Freq: Every evening | ORAL | Status: DC | PRN

## 2014-05-01 MED ORDER — PROPOFOL 10 MG/ML IV BOLUS
INTRAVENOUS | Status: DC | PRN
  Administered 2014-05-01: 20 mg via INTRAVENOUS
  Administered 2014-05-01: 140 mg via INTRAVENOUS

## 2014-05-01 MED ORDER — ARTIFICIAL TEARS OP OINT
TOPICAL_OINTMENT | OPHTHALMIC | Status: DC | PRN
  Administered 2014-05-01: 1 via OPHTHALMIC

## 2014-05-01 MED ORDER — LACTATED RINGERS IV SOLN
INTRAVENOUS | Status: DC | PRN
  Administered 2014-05-01 (×2): via INTRAVENOUS

## 2014-05-01 MED ORDER — EPHEDRINE SULFATE 50 MG/ML IJ SOLN
INTRAMUSCULAR | Status: DC | PRN
  Administered 2014-05-01 (×3): 10 mg via INTRAVENOUS

## 2014-05-01 MED ORDER — MORPHINE SULFATE 2 MG/ML IJ SOLN
2.0000 mg | INTRAMUSCULAR | Status: DC | PRN
  Administered 2014-05-01 – 2014-05-02 (×2): 4 mg via INTRAVENOUS
  Administered 2014-05-02: 2 mg via INTRAVENOUS
  Administered 2014-05-02: 4 mg via INTRAVENOUS
  Administered 2014-05-02: 2 mg via INTRAVENOUS
  Administered 2014-05-02 (×2): 4 mg via INTRAVENOUS
  Filled 2014-05-01 (×6): qty 2

## 2014-05-01 MED ORDER — ONDANSETRON HCL 4 MG/2ML IJ SOLN
INTRAMUSCULAR | Status: DC | PRN
  Administered 2014-05-01: 4 mg via INTRAVENOUS

## 2014-05-01 MED ORDER — PROPOFOL 10 MG/ML IV BOLUS
INTRAVENOUS | Status: AC
  Filled 2014-05-01: qty 20

## 2014-05-01 MED ORDER — GLYCOPYRROLATE 0.2 MG/ML IJ SOLN
INTRAMUSCULAR | Status: DC | PRN
  Administered 2014-05-01: 0.6 mg via INTRAVENOUS

## 2014-05-01 MED ORDER — LIDOCAINE HCL (CARDIAC) 20 MG/ML IV SOLN
INTRAVENOUS | Status: DC | PRN
  Administered 2014-05-01: 60 mg via INTRAVENOUS

## 2014-05-01 MED ORDER — IOHEXOL 300 MG/ML  SOLN
100.0000 mL | Freq: Once | INTRAMUSCULAR | Status: AC | PRN
  Administered 2014-05-01: 90 mL via INTRAVENOUS

## 2014-05-01 MED ORDER — ONDANSETRON HCL 4 MG/2ML IJ SOLN
4.0000 mg | Freq: Four times a day (QID) | INTRAMUSCULAR | Status: DC | PRN
Start: 2014-05-01 — End: 2014-05-03

## 2014-05-01 MED ORDER — HYDROMORPHONE HCL PF 1 MG/ML IJ SOLN
INTRAMUSCULAR | Status: AC
  Filled 2014-05-01: qty 1

## 2014-05-01 MED ORDER — HYDROMORPHONE HCL PF 1 MG/ML IJ SOLN
INTRAMUSCULAR | Status: AC
  Administered 2014-05-01: 0.5 mg via INTRAVENOUS
  Filled 2014-05-01: qty 1

## 2014-05-01 MED ORDER — LACTATED RINGERS IV SOLN
INTRAVENOUS | Status: DC
  Administered 2014-05-01 (×2): via INTRAVENOUS

## 2014-05-01 MED ORDER — MIDAZOLAM HCL 5 MG/5ML IJ SOLN
INTRAMUSCULAR | Status: DC | PRN
  Administered 2014-05-01 (×2): 1 mg via INTRAVENOUS

## 2014-05-01 MED ORDER — ONDANSETRON HCL 4 MG/2ML IJ SOLN
INTRAMUSCULAR | Status: AC
  Filled 2014-05-01: qty 2

## 2014-05-01 MED ORDER — AMLODIPINE BESYLATE 10 MG PO TABS
10.0000 mg | ORAL_TABLET | Freq: Every day | ORAL | Status: DC
  Administered 2014-05-02 – 2014-05-03 (×2): 10 mg via ORAL
  Filled 2014-05-01 (×3): qty 1

## 2014-05-01 MED ORDER — ONDANSETRON HCL 4 MG/2ML IJ SOLN
4.0000 mg | Freq: Once | INTRAMUSCULAR | Status: AC
  Administered 2014-05-01: 4 mg via INTRAVENOUS
  Filled 2014-05-01: qty 2

## 2014-05-01 MED ORDER — FENTANYL CITRATE 0.05 MG/ML IJ SOLN
INTRAMUSCULAR | Status: AC
  Filled 2014-05-01: qty 5

## 2014-05-01 MED ORDER — GABAPENTIN 400 MG PO CAPS
1200.0000 mg | ORAL_CAPSULE | Freq: Three times a day (TID) | ORAL | Status: DC
  Administered 2014-05-01 – 2014-05-03 (×5): 1200 mg via ORAL
  Filled 2014-05-01 (×8): qty 3

## 2014-05-01 MED ORDER — PNEUMOCOCCAL VAC POLYVALENT 25 MCG/0.5ML IJ INJ
0.5000 mL | INJECTION | INTRAMUSCULAR | Status: AC
  Administered 2014-05-02: 0.5 mL via INTRAMUSCULAR
  Filled 2014-05-01 (×2): qty 0.5

## 2014-05-01 MED ORDER — OXYCODONE HCL 5 MG/5ML PO SOLN: 5.0000 mg | Freq: Once | ORAL | Status: DC | PRN

## 2014-05-01 MED ORDER — MIDAZOLAM HCL 2 MG/2ML IJ SOLN
INTRAMUSCULAR | Status: AC
  Filled 2014-05-01: qty 2

## 2014-05-01 MED ORDER — ALBUTEROL SULFATE (2.5 MG/3ML) 0.083% IN NEBU: 3.0000 mL | INHALATION_SOLUTION | Freq: Four times a day (QID) | RESPIRATORY_TRACT | Status: DC | PRN

## 2014-05-01 MED ORDER — HYDROMORPHONE HCL PF 1 MG/ML IJ SOLN
0.2500 mg | INTRAMUSCULAR | Status: DC | PRN
  Administered 2014-05-01 (×6): 0.5 mg via INTRAVENOUS

## 2014-05-01 MED ORDER — KCL IN DEXTROSE-NACL 20-5-0.45 MEQ/L-%-% IV SOLN
Freq: Once | INTRAVENOUS | Status: AC
  Administered 2014-05-01: 08:00:00 via INTRAVENOUS
  Filled 2014-05-01: qty 1000

## 2014-05-01 MED ORDER — OXYCODONE HCL 5 MG PO TABS
5.0000 mg | ORAL_TABLET | ORAL | Status: DC | PRN
  Administered 2014-05-01 – 2014-05-03 (×5): 10 mg via ORAL
  Filled 2014-05-01 (×5): qty 2

## 2014-05-01 MED ORDER — PANTOPRAZOLE SODIUM 40 MG IV SOLR
40.0000 mg | Freq: Every day | INTRAVENOUS | Status: DC
  Administered 2014-05-01 – 2014-05-02 (×2): 40 mg via INTRAVENOUS
  Filled 2014-05-01 (×3): qty 40

## 2014-05-01 MED ORDER — GLYCOPYRROLATE 0.2 MG/ML IJ SOLN
INTRAMUSCULAR | Status: AC
  Filled 2014-05-01: qty 2

## 2014-05-01 MED ORDER — TAMSULOSIN HCL 0.4 MG PO CAPS
0.4000 mg | ORAL_CAPSULE | Freq: Every day | ORAL | Status: DC
  Administered 2014-05-02 – 2014-05-03 (×2): 0.4 mg via ORAL
  Filled 2014-05-01 (×2): qty 1

## 2014-05-01 MED ORDER — ONDANSETRON HCL 4 MG/2ML IJ SOLN
4.0000 mg | Freq: Four times a day (QID) | INTRAMUSCULAR | Status: AC | PRN
  Administered 2014-05-01: 4 mg via INTRAVENOUS

## 2014-05-01 MED ORDER — IOHEXOL 300 MG/ML  SOLN: INTRAMUSCULAR | Status: DC | PRN

## 2014-05-01 MED ORDER — KCL IN DEXTROSE-NACL 20-5-0.45 MEQ/L-%-% IV SOLN
INTRAVENOUS | Status: DC
  Administered 2014-05-02 – 2014-05-03 (×2): via INTRAVENOUS
  Filled 2014-05-01 (×6): qty 1000

## 2014-05-01 MED ORDER — BUPIVACAINE-EPINEPHRINE 0.25% -1:200000 IJ SOLN
INTRAMUSCULAR | Status: DC | PRN
  Administered 2014-05-01: 17 mL

## 2014-05-01 MED ORDER — CARBAMAZEPINE 200 MG PO TABS
200.0000 mg | ORAL_TABLET | Freq: Two times a day (BID) | ORAL | Status: DC
  Administered 2014-05-01 – 2014-05-03 (×4): 200 mg via ORAL
  Filled 2014-05-01 (×5): qty 1

## 2014-05-01 SURGICAL SUPPLY — 54 items
APPLIER CLIP 5 13 M/L LIGAMAX5 (MISCELLANEOUS) ×3
APPLIER CLIP ROT 10 11.4 M/L (STAPLE) ×3
BLADE SURG ROTATE 9660 (MISCELLANEOUS) IMPLANT
CANISTER SUCTION 2500CC (MISCELLANEOUS) ×3 IMPLANT
CHLORAPREP W/TINT 26ML (MISCELLANEOUS) ×3 IMPLANT
CLIP APPLIE 5 13 M/L LIGAMAX5 (MISCELLANEOUS) ×1 IMPLANT
CLIP APPLIE ROT 10 11.4 M/L (STAPLE) ×1 IMPLANT
COVER MAYO STAND STRL (DRAPES) ×3 IMPLANT
COVER SURGICAL LIGHT HANDLE (MISCELLANEOUS) ×3 IMPLANT
CUTTER LINEAR ENDO 35 ART THIN (STAPLE) ×3 IMPLANT
DERMABOND ADVANCED (GAUZE/BANDAGES/DRESSINGS) ×2
DERMABOND ADVANCED .7 DNX12 (GAUZE/BANDAGES/DRESSINGS) ×1 IMPLANT
DRAIN CHANNEL 19F RND (DRAIN) ×3 IMPLANT
DRAPE C-ARM 42X72 X-RAY (DRAPES) ×3 IMPLANT
DRAPE UTILITY 15X26 W/TAPE STR (DRAPE) ×6 IMPLANT
ELECT REM PT RETURN 9FT ADLT (ELECTROSURGICAL) ×3
ELECTRODE REM PT RTRN 9FT ADLT (ELECTROSURGICAL) ×1 IMPLANT
EVACUATOR SILICONE 100CC (DRAIN) ×3 IMPLANT
FILTER SMOKE EVAC LAPAROSHD (FILTER) IMPLANT
GLOVE BIO SURGEON STRL SZ8 (GLOVE) ×3 IMPLANT
GLOVE BIOGEL PI IND STRL 6.5 (GLOVE) ×1 IMPLANT
GLOVE BIOGEL PI IND STRL 7.0 (GLOVE) ×1 IMPLANT
GLOVE BIOGEL PI IND STRL 8 (GLOVE) ×2 IMPLANT
GLOVE BIOGEL PI INDICATOR 6.5 (GLOVE) ×2
GLOVE BIOGEL PI INDICATOR 7.0 (GLOVE) ×2
GLOVE BIOGEL PI INDICATOR 8 (GLOVE) ×4
GLOVE ECLIPSE 7.5 STRL STRAW (GLOVE) ×3 IMPLANT
GLOVE SURG SS PI 7.0 STRL IVOR (GLOVE) ×3 IMPLANT
GOWN STRL REUS W/ TWL LRG LVL3 (GOWN DISPOSABLE) ×2 IMPLANT
GOWN STRL REUS W/ TWL XL LVL3 (GOWN DISPOSABLE) ×1 IMPLANT
GOWN STRL REUS W/TWL LRG LVL3 (GOWN DISPOSABLE) ×4
GOWN STRL REUS W/TWL XL LVL3 (GOWN DISPOSABLE) ×2
KIT BASIN OR (CUSTOM PROCEDURE TRAY) ×3 IMPLANT
KIT ROOM TURNOVER OR (KITS) ×3 IMPLANT
NEEDLE 22X1 1/2 (OR ONLY) (NEEDLE) ×3 IMPLANT
NS IRRIG 1000ML POUR BTL (IV SOLUTION) ×3 IMPLANT
PAD ARMBOARD 7.5X6 YLW CONV (MISCELLANEOUS) ×3 IMPLANT
POUCH RETRIEVAL ECOSAC 10 (ENDOMECHANICALS) ×1 IMPLANT
POUCH RETRIEVAL ECOSAC 10MM (ENDOMECHANICALS) ×2
POUCH SPECIMEN RETRIEVAL 10MM (ENDOMECHANICALS) ×6 IMPLANT
SCISSORS LAP 5X35 DISP (ENDOMECHANICALS) ×3 IMPLANT
SET CHOLANGIOGRAPH 5 50 .035 (SET/KITS/TRAYS/PACK) ×3 IMPLANT
SET IRRIG TUBING LAPAROSCOPIC (IRRIGATION / IRRIGATOR) ×3 IMPLANT
SLEEVE ENDOPATH XCEL 5M (ENDOMECHANICALS) ×6 IMPLANT
SPECIMEN JAR SMALL (MISCELLANEOUS) ×3 IMPLANT
SUT ETHILON 2 0 FS 18 (SUTURE) ×3 IMPLANT
SUT VIC AB 4-0 PS2 27 (SUTURE) ×3 IMPLANT
TOWEL OR 17X24 6PK STRL BLUE (TOWEL DISPOSABLE) ×3 IMPLANT
TOWEL OR 17X26 10 PK STRL BLUE (TOWEL DISPOSABLE) ×3 IMPLANT
TRAY LAPAROSCOPIC (CUSTOM PROCEDURE TRAY) ×3 IMPLANT
TROCAR XCEL 12X100 BLDLESS (ENDOMECHANICALS) ×3 IMPLANT
TROCAR XCEL BLUNT TIP 100MML (ENDOMECHANICALS) ×3 IMPLANT
TROCAR XCEL NON-BLD 11X100MML (ENDOMECHANICALS) ×3 IMPLANT
TROCAR XCEL NON-BLD 5MMX100MML (ENDOMECHANICALS) ×3 IMPLANT

## 2014-05-01 NOTE — ED Notes (Signed)
Consent for surgery signed by pt. 

## 2014-05-01 NOTE — Transfer of Care (Signed)
Immediate Anesthesia Transfer of Care Note  Patient: James Fletcher  Procedure(s) Performed: Procedure(s): LAPAROSCOPIC CHOLECYSTECTOMY (N/A)  Patient Location: PACU  Anesthesia Type:General  Level of Consciousness: awake, alert  and oriented  Airway & Oxygen Therapy: Patient Spontanous Breathing and Patient connected to nasal cannula oxygen  Post-op Assessment: Report given to PACU RN and Post -op Vital signs reviewed and stable  Post vital signs: Reviewed and stable  Complications: No apparent anesthesia complications

## 2014-05-01 NOTE — ED Notes (Addendum)
Patient transported to X-ray, then ultrasound

## 2014-05-01 NOTE — ED Provider Notes (Signed)
CSN: [id number]     Arrival date & time 05/01/14  0230 History   First MD Initiated Contact with Patient 05/01/14 0244     Chief Complaint  Patient presents with  . Abdominal Pain     (Consider location/radiation/quality/duration/timing/severity/associated sxs/prior Treatment) HPI  This patient is a 68 year old man who presents with about 4 hours of severe midline epigastric pain which is nonradiating, aching, cramping and 10 over 10. His pain began rather abruptly after he had eaten dinner and while he was resting. It has been continuous. The patient has not appreciated any modifying factors  The patient denies nausea and vomiting he has not had chest pain or shortness of breath. No fever. No diarrhea or melena.  The patient notes that he had a gallbladder ultrasound about a year ago which suggested the possibility of gallstones. His only previous abdominal surgery is an appendectomy performed at age 38.    Past Medical History  Diagnosis Date  . Peripheral neuropathy   . Neuropathy 02/26/2012  . Left bundle-branch block 02/26/2012   Past Surgical History  Procedure Laterality Date  . Appendectomy     No family history on file. History  Substance Use Topics  . Smoking status: Current Every Day Smoker -- 1.00 packs/day  . Smokeless tobacco: Not on file  . Alcohol Use: No    Review of Systems 10 point review of symptoms obtained and is negative with the exceptions of symptoms noted abov.e    Allergies  Review of patient's allergies indicates no known allergies.  Home Medications   Prior to Admission medications   Medication Sig Start Date End Date Taking? Authorizing Provider  albuterol (PROVENTIL HFA;VENTOLIN HFA) 108 (90 BASE) MCG/ACT inhaler Inhale 2 puffs into the lungs every 6 (six) hours as needed for wheezing or shortness of breath. 03/05/12 06/16/13  Ripudeep Krystal Eaton, MD  aspirin 81 MG tablet Take 81 mg by mouth daily.    Historical Provider, MD  carbamazepine  (TEGRETOL) 200 MG tablet Take 1 tablet (200 mg total) by mouth 2 (two) times daily. 03/05/12   Ripudeep Krystal Eaton, MD  furosemide (LASIX) 40 MG tablet Take 1 tablet (40 mg total) by mouth daily. 03/05/12 03/05/13  Ripudeep Krystal Eaton, MD  gabapentin (NEURONTIN) 400 MG capsule Take 1,200 mg by mouth 3 (three) times daily.     Historical Provider, MD  HYDROcodone-acetaminophen (VICODIN) 5-500 MG per tablet Take 1 tablet by mouth every 6 (six) hours as needed. pain    Historical Provider, MD  levalbuterol (XOPENEX) 0.63 MG/3ML nebulizer solution Take 3 mLs (0.63 mg total) by nebulization 2 (two) times daily. 03/05/12 03/05/13  Ripudeep Krystal Eaton, MD  Multiple Vitamins-Minerals (MULTIVITAMIN WITH MINERALS) tablet Take 1 tablet by mouth daily.    Historical Provider, MD  naproxen (NAPROSYN) 500 MG tablet Take 500 mg by mouth 2 (two) times daily with a meal. Patient uses this medication for pain    Historical Provider, MD  omeprazole (PRILOSEC) 20 MG capsule Take 20 mg by mouth daily. Patient uses this medication for acid reflux.    Historical Provider, MD  simethicone (MYLICON) 80 MG chewable tablet Chew 1 tablet (80 mg total) by mouth 4 (four) times daily as needed for flatulence. 03/05/12 03/15/12  Ripudeep Krystal Eaton, MD  tamsulosin (FLOMAX) 0.4 MG CAPS capsule Take 1 capsule (0.4 mg total) by mouth daily. 06/16/13   Lonie Peak Dixon, PA-C  traZODone (DESYREL) 50 MG tablet Take 200 mg by mouth at bedtime. For sleep  Historical Provider, MD  VITAMIN D, CHOLECALCIFEROL, PO Take 1 tablet by mouth daily.    Historical Provider, MD   BP 170/80  Pulse 75  Temp(Src) 97.9 F (36.6 C) (Oral)  Resp 19  SpO2 98% Physical Exam  Gen: well nourished and well developed appearing, uncomfortable appearing Head: NCAT Ears: normal to inspection Nose: normal to inspection, no epistaxis or drainage Mouth: oral mucsoa is well hydrated appearing, normal posterior oropharynx Neck: supple, no stridor CV: RRR, no murmur, palpable peripheral  pulses Resp: lung sounds are clear to auscultation bilaterally, no wheeing or rhonchi or rales, normal respiratory effort.  Abd: soft, ttp over the midline epigastrium,  nontender, nondistended Extremities: normal to inspection.  Skin: warm and dry Neuro: CN ii - XII, no focal deficitis Psyche; normal affect, cooperative.   ED Course  Procedures (including critical care time) Labs Review  Results for orders placed during the hospital encounter of 05/01/14 (from the past 24 hour(s))  CBC WITH DIFFERENTIAL     Status: Abnormal   Collection Time    05/01/14  2:50 AM      Result Value Ref Range   WBC 8.9  4.0 - 10.5 K/uL   RBC 4.10 (*) 4.22 - 5.81 MIL/uL   Hemoglobin 12.6 (*) 13.0 - 17.0 g/dL   HCT 36.0 (*) 39.0 - 52.0 %   MCV 87.8  78.0 - 100.0 fL   MCH 30.7  26.0 - 34.0 pg   MCHC 35.0  30.0 - 36.0 g/dL   RDW 12.6  11.5 - 15.5 %   Platelets 214  150 - 400 K/uL   Neutrophils Relative % 72  43 - 77 %   Neutro Abs 6.4  1.7 - 7.7 K/uL   Lymphocytes Relative 20  12 - 46 %   Lymphs Abs 1.8  0.7 - 4.0 K/uL   Monocytes Relative 7  3 - 12 %   Monocytes Absolute 0.6  0.1 - 1.0 K/uL   Eosinophils Relative 1  0 - 5 %   Eosinophils Absolute 0.1  0.0 - 0.7 K/uL   Basophils Relative 0  0 - 1 %   Basophils Absolute 0.0  0.0 - 0.1 K/uL  LIPASE, BLOOD     Status: None   Collection Time    05/01/14  2:50 AM      Result Value Ref Range   Lipase 36  11 - 59 U/L  COMPREHENSIVE METABOLIC PANEL     Status: Abnormal   Collection Time    05/01/14  2:50 AM      Result Value Ref Range   Sodium 134 (*) 137 - 147 mEq/L   Potassium 3.4 (*) 3.7 - 5.3 mEq/L   Chloride 98  96 - 112 mEq/L   CO2 24  19 - 32 mEq/L   Glucose, Bld 119 (*) 70 - 99 mg/dL   BUN 11  6 - 23 mg/dL   Creatinine, Ser 0.59  0.50 - 1.35 mg/dL   Calcium 9.0  8.4 - 10.5 mg/dL   Total Protein 6.4  6.0 - 8.3 g/dL   Albumin 3.7  3.5 - 5.2 g/dL   AST 13  0 - 37 U/L   ALT 10  0 - 53 U/L   Alkaline Phosphatase 89  39 - 117 U/L   Total  Bilirubin <0.2 (*) 0.3 - 1.2 mg/dL   GFR calc non Af Amer >90  >90 mL/min   GFR calc Af Amer >90  >90 mL/min  Anion gap 12  5 - 15  I-STAT TROPOININ, ED     Status: None   Collection Time    05/01/14  3:00 AM      Result Value Ref Range   Troponin i, poc 0.00  0.00 - 0.08 ng/mL   Comment 3            EKG: nsr, no acute ischemic changes, normal intervals, normal axis, normal qrs complex  CT Abdomen Pelvis W Contrast (Final result)  Result time: 05/01/14 07:20:41    Final result by Rad Results In Interface (05/01/14 07:20:41)    Narrative:   CLINICAL DATA: Epigastric pain. Prior appendectomy.  EXAM: CT ABDOMEN AND PELVIS WITH CONTRAST  TECHNIQUE: Multidetector CT imaging of the abdomen and pelvis was performed using the standard protocol following bolus administration of intravenous contrast.  CONTRAST: 33mL OMNIPAQUE IOHEXOL 300 MG/ML SOLN  COMPARISON: Abdominal ultrasound of earlier in the day. Prior CT of 03/04/2012  FINDINGS: Lower chest: Minimal motion degradation. Mild bibasilar atelectasis. Normal heart size without pericardial or pleural effusion.  Liver: Similar perfusion anomaly or flash fill hemangioma in the lateral segment left liver lobe at 1.0 cm. No suspicious liver lesion.  Spleen: Splenule.  Stomach: Proximal gastric underdistention. No mass or wall thickening identified.  Pancreas: Normal, without mass or pancreatic ductal dilatation.  Gallbladder/Biliary Tree: Mild pericholecystic edema identified, including on image 37 of series 2. Suspicion of gallbladder neck stone at 7 mm on image 37. Mild-to-moderate common duct dilatation, 12 mm on coronal image 35.  Kidneys/Adrenals: Normal adrenal glands. Too small to characterize interpolar left renal lesion. Normal right kidney.  Bowel loops: Scattered colonic diverticula. Normal terminal ileum. Appendix surgically absent. Normal small bowel without abdominal ascites.  Vascular: Advanced aortic  and branch vessel atherosclerosis.  Nodes: No retroperitoneal or retrocrural adenopathy. No pelvic adenopathy.  Pelvic Genitourinary: Normal urinary bladder and prostate.  Other: No significant free fluid. Fat containing right inguinal hernia.  Bones/Musculoskeletal: Lumbosacral spondylosis with disc bulge at L4-5 and multilevel degenerative disc disease.  IMPRESSION: 1. Mild pericholecystic edema with suggestion of a gallbladder neck stone. Findings are suspicious for acute cholecystitis. However, no gallbladder tenderness or significant wall thickening identified on today's ultrasound. Therefore, nuclear medicine hepatobiliary study should be considered. 2. Mild-to-moderate common duct dilatation, new. Cannot exclude choledocholithiasis. If this is a high clinical concern, ERCP should be considered. If this is a low to intermediate clinical concern, MRCP should be considered. 3. Left hepatic lobe hemangioma or perfusion anomaly. No CT correlate for the questioned ultrasound abnormality. 4. Advanced atherosclerosis.   Electronically Signed By: Abigail Miyamoto M.D. On: 05/01/2014 07:20     MDM    DDX: pancreatitis, biliary colic, cholecystitis, ACS, PNA, colitis, IBS, IBD.   CT scan suggests acute cholecystitis with suggestion of a stone at the GB neck. We are treating supportively. Case discussed with Dr. Grandville Silos who will see and evaluate for admission.   Elyn Peers, MD 05/01/14 [phone]

## 2014-05-01 NOTE — ED Notes (Signed)
Patient transported to Ultrasound 

## 2014-05-01 NOTE — ED Notes (Signed)
Dr. Thompson at the bedside 

## 2014-05-01 NOTE — Op Note (Signed)
05/01/2014  5:21 PM  PATIENT:  James Fletcher  68 y.o. male  PRE-OPERATIVE DIAGNOSIS:  Cholecystitis  POST-OPERATIVE DIAGNOSIS:  Cholecystitis  PROCEDURE:  Procedure(s): LAPAROSCOPIC CHOLECYSTECTOMY  SURGEON:  Georganna Skeans. MD  ASSISTANTS: Judeth Horn, M.D.   ANESTHESIA:   local and general  EBL:  Total I/O In: 1300 [I.V.:1300] Out: 1000 [Urine:900; Blood:100]  BLOOD ADMINISTERED:none  DRAINS: (1) Jackson-Pratt drain(s) with closed bulb suction in the ruq   SPECIMEN:  Excision  DISPOSITION OF SPECIMEN:  PATHOLOGY  COUNTS:  YES  DICTATION: .Dragon Dictation Patient was admitted with cholecystitis. He is brought for laparoscopic cholecystectomy. He received intravenous antibiotics.Informed consent was obtained. He was brought to the operating room and general endotracheal anesthesia was administered by the anesthesia staff. His abdomen was prepped and draped in sterile fashion. Time out procedure was performed. Infraumbilical region was infiltrated with local.Infra-umbilical incision was made. Subcutaneous tissues were dissected down revealing the anterior fascia. This was divided sharply along the midline. Peritoneal cavity was entered under direct vision. 0 Vicryl pursestring was placed on the fascial opening. Hassan trocar was inserted. Abdomen was insufflated with carbon dioxide in standard fashion. Under direct vision, a 5 mm epigastric and 2 5 mm lateral ports were placed.Local was used at each port site. Laparoscopic exploration revealed the gallbladder wrapped in omentum. The dome the gallbladder was retracted superior medially. The omentum was very adherent. It was gradually dissected off of the gallbladder revealing the body. It was nearly filled by a stone. The dissection continued until we reached the tapering area where we thought of the cystic duct to be located. Careful slow dissection was done there.We were not able to get the area to narrow down enough to fully  visualize the cystic duct. At this point, A dome down technique was used to remove the liver from the gallbladder bed. We continued down until this area of suspected cystic duct. The gallbladder was free except were was attached to the cystic duct area. We could not dissect this down small enough to be closed with a clip.We upsized our epigastric port to 12 mm. We further inspected the area and entertain the idea that there was some residual gallbladder. All we could see below this area, however, appeared to be the area of the common duct.The gallbladder that we had dissected out had been tearing during the dissection. The stones, which were large, were removed from it. This allowed [place] to look inside and it appeared that the gallbladder was clearly tapering down to several millimeters across, consistent with the cystic duct. Due to the outside diameter, an Endo GIA was used to divide this cystic duct area. The gallbladder was removed. Stones and the gallbladder were removed sequentially with 2 different Endo Catch bags. Next we reinspected the area of cystic duct closure. There is a small amount of bile leakage so additional clips were placed along the staple line. This seemed to seal the area.A 19 [place] Blake drain was placed in the liver bed and brought out via the lateral port site. It was secured to the skin with nylon. Abdomen was copiously irrigated. Liver bed was dry. No bile leakage was seen. Irrigation fluid was evacuated. Pneumoperitoneum was released. Ports were removed. Infraumbilical fascia was closed by tying the pursestring suture. An additional figure-of-eight 0 Vicryl was placed to reinforce this closure. 3 remaining wounds were closed with 4-0 Vicryl subcuticular sutures followed by Dermabond. All counts were correct. Patient tolerated the procedure well without apparent complication was  taken recovery in stable condition.  PATIENT DISPOSITION:  PACU - hemodynamically stable.   Delay start of  Pharmacological VTE agent (>24hrs) due to surgical blood loss or risk of bleeding:  no  Georganna Skeans, MD, MPH, FACS Pager: [phone]  8/24/20155:21 PM

## 2014-05-01 NOTE — ED Notes (Signed)
Family at bedside. 

## 2014-05-01 NOTE — ED Notes (Signed)
Attempted report x1. 

## 2014-05-01 NOTE — H&P (Signed)
James Fletcher is an 68 y.o. male.   Chief Complaint: Epigastric and right upper quadrant pain HPI: Patient developed acute onset epigastric and right upper quadrant abdominal pain last night. It prevented him from going to sleep. No significant associated nausea or vomiting. The pain persisted and he came to the emergency department this morning. He notes similar episodes in the past starting 2 years ago but none of these previous episodes were as severe. He noted then to be associated with eating and to resolve spontaneously. His pain is improved but not resolved since being in the emergency department. CT scan is consistent with acute cholecystitis and we are asked to see him for admission.  Past Medical History  Diagnosis Date  . Peripheral neuropathy   . Neuropathy 02/26/2012  . Left bundle-branch block 02/26/2012    Past Surgical History  Procedure Laterality Date  . Appendectomy      No family history on file. Social History:  reports that he has been smoking.  He does not have any smokeless tobacco history on file. He reports that he does not drink alcohol or use illicit drugs.  Allergies: No Known Allergies   (Not in a hospital admission)  Results for orders placed during the hospital encounter of 05/01/14 (from the past 48 hour(s))  CBC WITH DIFFERENTIAL     Status: Abnormal   Collection Time    05/01/14  2:50 AM      Result Value Ref Range   WBC 8.9  4.0 - 10.5 K/uL   RBC 4.10 (*) 4.22 - 5.81 MIL/uL   Hemoglobin 12.6 (*) 13.0 - 17.0 g/dL   HCT 36.0 (*) 39.0 - 52.0 %   MCV 87.8  78.0 - 100.0 fL   MCH 30.7  26.0 - 34.0 pg   MCHC 35.0  30.0 - 36.0 g/dL   RDW 12.6  11.5 - 15.5 %   Platelets 214  150 - 400 K/uL   Neutrophils Relative % 72  43 - 77 %   Neutro Abs 6.4  1.7 - 7.7 K/uL   Lymphocytes Relative 20  12 - 46 %   Lymphs Abs 1.8  0.7 - 4.0 K/uL   Monocytes Relative 7  3 - 12 %   Monocytes Absolute 0.6  0.1 - 1.0 K/uL   Eosinophils Relative 1  0 - 5 %   Eosinophils Absolute 0.1  0.0 - 0.7 K/uL   Basophils Relative 0  0 - 1 %   Basophils Absolute 0.0  0.0 - 0.1 K/uL  LIPASE, BLOOD     Status: None   Collection Time    05/01/14  2:50 AM      Result Value Ref Range   Lipase 36  11 - 59 U/L  COMPREHENSIVE METABOLIC PANEL     Status: Abnormal   Collection Time    05/01/14  2:50 AM      Result Value Ref Range   Sodium 134 (*) 137 - 147 mEq/L   Potassium 3.4 (*) 3.7 - 5.3 mEq/L   Chloride 98  96 - 112 mEq/L   CO2 24  19 - 32 mEq/L   Glucose, Bld 119 (*) 70 - 99 mg/dL   BUN 11  6 - 23 mg/dL   Creatinine, Ser 0.59  0.50 - 1.35 mg/dL   Calcium 9.0  8.4 - 10.5 mg/dL   Total Protein 6.4  6.0 - 8.3 g/dL   Albumin 3.7  3.5 - 5.2 g/dL   AST 13  0 - 37 U/L   ALT 10  0 - 53 U/L   Alkaline Phosphatase 89  39 - 117 U/L   Total Bilirubin <0.2 (*) 0.3 - 1.2 mg/dL   GFR calc non Af Amer >90  >90 mL/min   GFR calc Af Amer >90  >90 mL/min   Comment: (NOTE)     The eGFR has been calculated using the CKD EPI equation.     This calculation has not been validated in all clinical situations.     eGFR's persistently <90 mL/min signify possible Chronic Kidney     Disease.   Anion gap 12  5 - 15  I-STAT TROPOININ, ED     Status: None   Collection Time    05/01/14  3:00 AM      Result Value Ref Range   Troponin i, poc 0.00  0.00 - 0.08 ng/mL   Comment 3            Comment: Due to the release kinetics of cTnI,     a negative result within the first hours     of the onset of symptoms does not rule out     myocardial infarction with certainty.     If myocardial infarction is still suspected,     repeat the test at appropriate intervals.   Dg Chest 2 View  05/01/2014   CLINICAL DATA:  Mid upper abdominal pain.  EXAM: CHEST  2 VIEW  COMPARISON:  03/04/2012  FINDINGS: Shallow inspiration. The heart size and mediastinal contours are within normal limits. Both lungs are clear. The visualized skeletal structures are unremarkable.  IMPRESSION: No active  cardiopulmonary disease.   Electronically Signed   By: Lucienne Capers M.D.   On: 05/01/2014 03:39   Ct Abdomen Pelvis W Contrast  05/01/2014   CLINICAL DATA:  Epigastric pain.  Prior appendectomy.  EXAM: CT ABDOMEN AND PELVIS WITH CONTRAST  TECHNIQUE: Multidetector CT imaging of the abdomen and pelvis was performed using the standard protocol following bolus administration of intravenous contrast.  CONTRAST:  54m OMNIPAQUE IOHEXOL 300 MG/ML  SOLN  COMPARISON:  Abdominal ultrasound of earlier in the day. Prior CT of 03/04/2012  FINDINGS: Lower chest: Minimal motion degradation. Mild bibasilar atelectasis. Normal heart size without pericardial or pleural effusion.  Liver: Similar perfusion anomaly or flash fill hemangioma in the lateral segment left liver lobe at 1.0 cm. No suspicious liver lesion.  Spleen: Splenule.  Stomach: Proximal gastric underdistention. No mass or wall thickening identified.  Pancreas: Normal, without mass or pancreatic ductal dilatation.  Gallbladder/Biliary Tree: Mild pericholecystic edema identified, including on image 37 of series 2. Suspicion of gallbladder neck stone at 7 mm on image 37. Mild-to-moderate common duct dilatation, 12 mm on coronal image 35.  Kidneys/Adrenals: Normal adrenal glands. Too small to characterize interpolar left renal lesion. Normal right kidney.  Bowel loops: Scattered colonic diverticula. Normal terminal ileum. Appendix surgically absent. Normal small bowel without abdominal ascites.  Vascular: Advanced aortic and branch vessel atherosclerosis.  Nodes: No retroperitoneal or retrocrural adenopathy. No pelvic adenopathy.  Pelvic Genitourinary: Normal urinary bladder and prostate.  Other: No significant free fluid. Fat containing right inguinal hernia.  Bones/Musculoskeletal: Lumbosacral spondylosis with disc bulge at L4-5 and multilevel degenerative disc disease.  IMPRESSION: 1. Mild pericholecystic edema with suggestion of a gallbladder neck stone. Findings  are suspicious for acute cholecystitis. However, no gallbladder tenderness or significant wall thickening identified on today's ultrasound. Therefore, nuclear medicine hepatobiliary study should be considered. 2.  Mild-to-moderate common duct dilatation, new. Cannot exclude choledocholithiasis. If this is a high clinical concern, ERCP should be considered. If this is a low to intermediate clinical concern, MRCP should be considered. 3. Left hepatic lobe hemangioma or perfusion anomaly. No CT correlate for the questioned ultrasound abnormality. 4. Advanced atherosclerosis.   Electronically Signed   By: Abigail Miyamoto M.D.   On: 05/01/2014 07:20   US Abdomen Limited  05/01/2014   CLINICAL DATA:  Right upper quadrant and epigastric pain.  EXAM: US ABDOMEN LIMITED - RIGHT UPPER QUADRANT  COMPARISON:  CT abdomen and pelvis 03/04/2012  FINDINGS: Gallbladder:  The gallbladder has a bilobed shape with multiple areas of ring down artifact and without significant wall thickening. Changes are consistent with adenomyosis and appear similar to previous CT scan. No stones are identified. Murphy's sign is negative.  Common bile duct:  Diameter: 7 mm, upper limits of normal.  Liver:  Liver parenchymal echotexture is somewhat heterogeneous diffusely. There is a vague focal hypodense lesion in the anterior right lobe of the liver measuring about 1.6 cm maximal dimension. This is not definitely seen on the prior study although there is a small hemangioma previously seen in the lateral segment left lobe. Suggest correlation with CT for exclusion of developing liver parenchymal nodules.  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 No bile duct dilatation. Suggestion of developing focal lesion in the liver. Suggest correlation with CT for further evaluation. Alternatively, elective MRI could be obtained.   Electronically Signed   By: Lucienne Capers M.D.   On: 05/01/2014 04:37    Review of Systems  Constitutional: Negative  for fever and chills.  HENT: Negative.   Eyes: Negative.   Respiratory: Negative.   Cardiovascular: Negative.   Gastrointestinal: Positive for abdominal pain. Negative for nausea, vomiting and constipation.  Genitourinary: Negative.   Musculoskeletal: Negative.   Skin: Negative.   Neurological:       Chronic neuropathy bilateral lower extremities  Endo/Heme/Allergies: Negative.   Psychiatric/Behavioral: Negative.     Blood pressure 159/74, pulse 65, temperature 97.9 F (36.6 C), temperature source Oral, resp. rate 12, SpO2 93.00%. Physical Exam  Constitutional: He is oriented to person, place, and time. He appears well-developed and well-nourished.  HENT:  Head: Normocephalic and atraumatic.  Nose: Nose normal.  Mouth/Throat: Oropharynx is clear and moist. No oropharyngeal exudate.  Eyes: EOM are normal. Pupils are equal, round, and reactive to light. Right eye exhibits no discharge. Left eye exhibits no discharge. No scleral icterus.  Neck: Normal range of motion. Neck supple. No tracheal deviation present.  Cardiovascular: Normal rate, normal heart sounds and intact distal pulses.   Respiratory: Effort normal and breath sounds normal. No stridor. No respiratory distress. He has no wheezes. He has no rales.  GI: Soft. He exhibits no distension. There is tenderness. There is no rebound and no guarding.  Tender right upper quadrant, no guarding, no generalized tenderness, hypoactive bowel sounds  Musculoskeletal: Normal range of motion.  Neurological: He is alert and oriented to person, place, and time. He exhibits normal muscle tone. Coordination normal.  Skin: Skin is warm.  Psychiatric: He has a normal mood and affect.     Assessment/Plan Acute cholecystitis - will admit, begin IV antibiotics, and plan laparoscopic cholecystectomy later today. Procedure, risks, and benefits were discussed in detail with the patient and his wife. He is agreeable.  Ruberta Holck E 05/01/2014,  8:20 AM

## 2014-05-01 NOTE — ED Notes (Signed)
Pt from home. Started having mid abdominal pain around 10:30pm last evening. Denies N/V/D. Rates abdominal pain 10/10 at this time.

## 2014-05-01 NOTE — Anesthesia Preprocedure Evaluation (Signed)
Anesthesia Evaluation  Patient identified by MRN, date of birth, ID band Patient awake    Reviewed: Allergy & Precautions, H&P , NPO status , Patient's Chart, lab work & pertinent test results  Airway Mallampati: II  Neck ROM: full    Dental   Pulmonary Current Smoker,          Cardiovascular negative cardio ROS      Neuro/Psych  Neuromuscular disease    GI/Hepatic   Endo/Other    Renal/GU      Musculoskeletal   Abdominal   Peds  Hematology   Anesthesia Other Findings   Reproductive/Obstetrics                           Anesthesia Physical Anesthesia Plan  ASA: II  Anesthesia Plan: General   Post-op Pain Management:    Induction: Intravenous  Airway Management Planned: Oral ETT  Additional Equipment:   Intra-op Plan:   Post-operative Plan: Extubation in OR  Informed Consent: I have reviewed the patients History and Physical, chart, labs and discussed the procedure including the risks, benefits and alternatives for the proposed anesthesia with the patient or authorized representative who has indicated his/her understanding and acceptance.     Plan Discussed with: CRNA, Anesthesiologist and Surgeon  Anesthesia Plan Comments:         Anesthesia Quick Evaluation

## 2014-05-01 NOTE — Anesthesia Postprocedure Evaluation (Signed)
  Anesthesia Post-op Note  Patient: James Fletcher  Procedure(s) Performed: Procedure(s): LAPAROSCOPIC CHOLECYSTECTOMY (N/A)  Patient Location: PACU  Anesthesia Type:General  Level of Consciousness: awake, alert , oriented and patient cooperative  Airway and Oxygen Therapy: Patient Spontanous Breathing and Patient connected to nasal cannula oxygen  Post-op Pain: mild  Post-op Assessment: Post-op Vital signs reviewed, Patient's Cardiovascular Status Stable, Respiratory Function Stable, Patent Airway, No signs of Nausea or vomiting and Pain level controlled  Post-op Vital Signs: Reviewed and stable  Last Vitals:  Filed Vitals:   05/01/14 1930  BP: 165/76  Pulse: 87  Temp: 36.5 C  Resp: 20    Complications: No apparent anesthesia complications

## 2014-05-01 NOTE — Anesthesia Procedure Notes (Signed)
Procedure Name: Intubation Date/Time: 05/01/2014 3:04 PM Performed by: Susa Loffler Pre-anesthesia Checklist: Patient identified, Timeout performed, Emergency Drugs available, Suction available and Patient being monitored Patient Re-evaluated:Patient Re-evaluated prior to inductionOxygen Delivery Method: Circle system utilized Preoxygenation: Pre-oxygenation with 100% oxygen Intubation Type: IV induction Ventilation: Mask ventilation without difficulty and Oral airway inserted - appropriate to patient size Laryngoscope Size: Mac and 4 Grade View: Grade II Tube type: Oral Tube size: 7.5 mm Airway Equipment and Method: Stylet Placement Confirmation: ETT inserted through vocal cords under direct vision,  positive ETCO2 and breath sounds checked- equal and bilateral Secured at: 22 cm Tube secured with: Tape Dental Injury: Teeth and Oropharynx as per pre-operative assessment

## 2014-05-02 ENCOUNTER — Encounter (HOSPITAL_COMMUNITY): Payer: Self-pay | Admitting: General Surgery

## 2014-05-02 DIAGNOSIS — Z791 Long term (current) use of non-steroidal anti-inflammatories (NSAID): Secondary | ICD-10-CM | POA: Diagnosis not present

## 2014-05-02 DIAGNOSIS — K8010 Calculus of gallbladder with chronic cholecystitis without obstruction: Secondary | ICD-10-CM | POA: Diagnosis not present

## 2014-05-02 DIAGNOSIS — R109 Unspecified abdominal pain: Secondary | ICD-10-CM | POA: Diagnosis present

## 2014-05-02 DIAGNOSIS — K8000 Calculus of gallbladder with acute cholecystitis without obstruction: Secondary | ICD-10-CM | POA: Diagnosis not present

## 2014-05-02 DIAGNOSIS — G609 Hereditary and idiopathic neuropathy, unspecified: Secondary | ICD-10-CM | POA: Diagnosis not present

## 2014-05-02 DIAGNOSIS — Z7982 Long term (current) use of aspirin: Secondary | ICD-10-CM | POA: Diagnosis not present

## 2014-05-02 DIAGNOSIS — Z79899 Other long term (current) drug therapy: Secondary | ICD-10-CM | POA: Diagnosis not present

## 2014-05-02 DIAGNOSIS — F17200 Nicotine dependence, unspecified, uncomplicated: Secondary | ICD-10-CM | POA: Diagnosis not present

## 2014-05-02 NOTE — Progress Notes (Signed)
JP looks like old blood. Will watch.Patient examined and I agree with the assessment and plan  Georganna Skeans, MD, MPH, FACS Trauma: [phone] General Surgery: [phone]  05/02/2014 10:55 AM

## 2014-05-02 NOTE — Progress Notes (Signed)
Patient ID: James Fletcher, male   DOB: 1946/01/28, 68 y.o.   MRN: [id number] 1 Day Post-Op  Subjective: Pt feels ok this morning, but sore.  Tolerating some liquids.  No nausea  Objective: Vital signs in last 24 hours: Temp:  [97.6 F (36.4 C)-98.5 F (36.9 C)] 98.5 F (36.9 C) (08/25 0933) Pulse Rate:  [65-91] 71 (08/25 0933) Resp:  [9-20] 19 (08/25 0933) BP: (122-177)/(50-80) 137/64 mmHg (08/25 0933) SpO2:  [92 %-99 %] 93 % (08/25 0933) Weight:  [221 lb 11.2 oz (100.562 kg)] 221 lb 11.2 oz (100.562 kg) (08/24 1335)    Intake/Output from previous day: 08/24 0701 - 08/25 0700 In: 2167.5 [P.O.:120; I.V.:2047.5] Out: 2450 [Urine:2150; Drains:200; Blood:100] Intake/Output this shift: Total I/O In: -  Out: 250 [Urine:250]  PE: Abd: soft, appropriately tender, +BS, ND, incisions c/d/i, JP drain serosang  Lab Results:   Recent Labs  05/01/14 0250  WBC 8.9  HGB 12.6*  HCT 36.0*  PLT 214   BMET  Recent Labs  05/01/14 0250  NA 134*  K 3.4*  CL 98  CO2 24  GLUCOSE 119*  BUN 11  CREATININE 0.59  CALCIUM 9.0   PT/INR No results found for this basename: LABPROT, INR,  in the last 72 hours CMP     Component Value Date/Time   NA 134* 05/01/2014 0250   K 3.4* 05/01/2014 0250   CL 98 05/01/2014 0250   CO2 24 05/01/2014 0250   GLUCOSE 119* 05/01/2014 0250   BUN 11 05/01/2014 0250   CREATININE 0.59 05/01/2014 0250   CALCIUM 9.0 05/01/2014 0250   PROT 6.4 05/01/2014 0250   ALBUMIN 3.7 05/01/2014 0250   AST 13 05/01/2014 0250   ALT 10 05/01/2014 0250   ALKPHOS 89 05/01/2014 0250   BILITOT <0.2* 05/01/2014 0250   GFRNONAA >90 05/01/2014 0250   GFRAA >90 05/01/2014 0250   Lipase     Component Value Date/Time   LIPASE 36 05/01/2014 0250       Studies/Results: Dg Chest 2 View  05/01/2014   CLINICAL DATA:  Mid upper abdominal pain.  EXAM: CHEST  2 VIEW  COMPARISON:  03/04/2012  FINDINGS: Shallow inspiration. The heart size and mediastinal contours are within normal limits.  Both lungs are clear. The visualized skeletal structures are unremarkable.  IMPRESSION: No active cardiopulmonary disease.   Electronically Signed   By: Lucienne Capers M.D.   On: 05/01/2014 03:39   Ct Abdomen Pelvis W Contrast  05/01/2014   CLINICAL DATA:  Epigastric pain.  Prior appendectomy.  EXAM: CT ABDOMEN AND PELVIS WITH CONTRAST  TECHNIQUE: Multidetector CT imaging of the abdomen and pelvis was performed using the standard protocol following bolus administration of intravenous contrast.  CONTRAST:  22mL OMNIPAQUE IOHEXOL 300 MG/ML  SOLN  COMPARISON:  Abdominal ultrasound of earlier in the day. Prior CT of 03/04/2012  FINDINGS: Lower chest: Minimal motion degradation. Mild bibasilar atelectasis. Normal heart size without pericardial or pleural effusion.  Liver: Similar perfusion anomaly or flash fill hemangioma in the lateral segment left liver lobe at 1.0 cm. No suspicious liver lesion.  Spleen: Splenule.  Stomach: Proximal gastric underdistention. No mass or wall thickening identified.  Pancreas: Normal, without mass or pancreatic ductal dilatation.  Gallbladder/Biliary Tree: Mild pericholecystic edema identified, including on image 37 of series 2. Suspicion of gallbladder neck stone at 7 mm on image 37. Mild-to-moderate common duct dilatation, 12 mm on coronal image 35.  Kidneys/Adrenals: Normal adrenal glands. Too small to characterize interpolar  left renal lesion. Normal right kidney.  Bowel loops: Scattered colonic diverticula. Normal terminal ileum. Appendix surgically absent. Normal small bowel without abdominal ascites.  Vascular: Advanced aortic and branch vessel atherosclerosis.  Nodes: No retroperitoneal or retrocrural adenopathy. No pelvic adenopathy.  Pelvic Genitourinary: Normal urinary bladder and prostate.  Other: No significant free fluid. Fat containing right inguinal hernia.  Bones/Musculoskeletal: Lumbosacral spondylosis with disc bulge at L4-5 and multilevel degenerative disc  disease.  IMPRESSION: 1. Mild pericholecystic edema with suggestion of a gallbladder neck stone. Findings are suspicious for acute cholecystitis. However, no gallbladder tenderness or significant wall thickening identified on today's ultrasound. Therefore, nuclear medicine hepatobiliary study should be considered. 2. Mild-to-moderate common duct dilatation, new. Cannot exclude choledocholithiasis. If this is a high clinical concern, ERCP should be considered. If this is a low to intermediate clinical concern, MRCP should be considered. 3. Left hepatic lobe hemangioma or perfusion anomaly. No CT correlate for the questioned ultrasound abnormality. 4. Advanced atherosclerosis.   Electronically Signed   By: Abigail Miyamoto M.D.   On: 05/01/2014 07:20   US Abdomen Limited  05/01/2014   CLINICAL DATA:  Right upper quadrant and epigastric pain.  EXAM: US ABDOMEN LIMITED - RIGHT UPPER QUADRANT  COMPARISON:  CT abdomen and pelvis 03/04/2012  FINDINGS: Gallbladder:  The gallbladder has a bilobed shape with multiple areas of ring down artifact and without significant wall thickening. Changes are consistent with adenomyosis and appear similar to previous CT scan. No stones are identified. Murphy's sign is negative.  Common bile duct:  Diameter: 7 mm, upper limits of normal.  Liver:  Liver parenchymal echotexture is somewhat heterogeneous diffusely. There is a vague focal hypodense lesion in the anterior right lobe of the liver measuring about 1.6 cm maximal dimension. This is not definitely seen on the prior study although there is a small hemangioma previously seen in the lateral segment left lobe. Suggest correlation with CT for exclusion of developing liver parenchymal nodules.  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 No bile duct dilatation. Suggestion of developing focal lesion in the liver. Suggest correlation with CT for further evaluation. Alternatively, elective MRI could be obtained.   Electronically  Signed   By: Lucienne Capers M.D.   On: 05/01/2014 04:37    Anti-infectives: Anti-infectives   Start     Dose/Rate Route Frequency Ordered Stop   05/01/14 1600  ciprofloxacin (CIPRO) IVPB 400 mg     400 mg 200 mL/hr over 60 Minutes Intravenous Every 12 hours 05/01/14 1333         Assessment/Plan  1. POD 1, s/p lap chole  Plan: 1. Difficult lap chole.  Would like to keep another day to follow drain to rule out bile leak.  Advance diet today and mobilize.   LOS: 1 day    Greco Gastelum E 05/02/2014, 9:54 AM Pager: [phone]

## 2014-05-03 ENCOUNTER — Telehealth (INDEPENDENT_AMBULATORY_CARE_PROVIDER_SITE_OTHER): Payer: Self-pay

## 2014-05-03 MED ORDER — AMOXICILLIN-POT CLAVULANATE 875-125 MG PO TABS: 1.0000 | ORAL_TABLET | Freq: Two times a day (BID) | ORAL | Status: DC

## 2014-05-03 MED ORDER — OXYCODONE HCL 5 MG PO TABS: 5.0000 mg | ORAL_TABLET | Freq: Four times a day (QID) | ORAL | Status: DC | PRN

## 2014-05-03 NOTE — Discharge Summary (Signed)
[facility] Discharge Summary   Patient ID: James Fletcher MRN: [id number] DOB/AGE: 1946/08/13 68 y.o.  Admit date: 05/01/2014 Discharge date: 05/03/2014  Admitting Diagnosis: Cholecystitis  Discharge Diagnosis Patient Active Problem List   Diagnosis Date Noted  . Cholecystitis with cholelithiasis 05/01/2014  . Hypokalemia 02/27/2012  . Abdominal pain 02/27/2012  . Fever 02/27/2012  . Leukopenia with low lymphocyte count 02/27/2012  . Anemia due to acute illness  02/27/2012  . Rhabdomyolysis 02/27/2012  . SIRS (systemic inflammatory response syndrome) 02/26/2012  . Acute respiratory failure with hypoxia 02/26/2012  . Community acquired pneumonia 02/26/2012  . Dehydration with hyponatremia 02/26/2012  . Abnormal transaminases 02/26/2012  . Peripheral neuropathy- unspecified 02/26/2012  . Left bundle-branch block 02/26/2012  . Thrombocytopenia 02/26/2012    Consultants None  Imaging: No results found.  Procedures Dr. Grandville Silos (05/01/14) - Laparoscopic Cholecystectomy   Hospital Course:  68 y/o white male developed acute onset epigastric and right upper quadrant abdominal pain last night. It prevented him from going to sleep. No significant associated nausea or vomiting. The pain persisted and he came to the emergency department this morning. He notes similar episodes in the past starting 2 years ago but none of these previous episodes were as severe. He noted then to be associated with eating and to resolve spontaneously. His pain is improved but not resolved since being in the emergency department. CT scan is consistent with acute cholecystitis and we are asked to see him for admission.  Patient was admitted and underwent procedure listed above.  Tolerated procedure well and was transferred to the floor.  Diet was advanced as tolerated.  On POD #2, the patient was voiding well, tolerating diet, ambulating well, pain well controlled, vital signs stable,  incisions c/d/i and felt stable for discharge home.  Patient will follow up in our office in 1 week for possible drain removal and in 2-3 weeks for post-op check and knows to call with questions or concerns.  He will continue his Augmentin for 7 days.  He will continue the JP drain until at least next week when he can f/u in the office to have it evaluated.    Physical Exam: General:  Alert, NAD, pleasant, comfortable Abd:  Soft, ND, mild tenderness, incisions C/D/I, drain with sanguinous drainage (183mL/24hr)    Medication List         albuterol 108 (90 BASE) MCG/ACT inhaler  Commonly known as:  PROVENTIL HFA;VENTOLIN HFA  Inhale 2 puffs into the lungs every 6 (six) hours as needed for wheezing or shortness of breath.     amLODipine 10 MG tablet  Commonly known as:  NORVASC  Take 10 mg by mouth daily.     **NEW: amoxicillin-clavulanate 875-125 MG per tablet**  Commonly known as:  AUGMENTIN  Take 1 tablet by mouth 2 (two) times daily.     aspirin 81 MG tablet  Take 81 mg by mouth daily.     carbamazepine 200 MG tablet  Commonly known as:  TEGRETOL  Take 1 tablet (200 mg total) by mouth 2 (two) times daily.     gabapentin 400 MG capsule  Commonly known as:  NEURONTIN  Take 1,200 mg by mouth 3 (three) times daily.     multivitamin with minerals tablet  Take 1 tablet by mouth daily.     naproxen 500 MG tablet  Commonly known as:  NAPROSYN  Take 500 mg by mouth 2 (two) times daily with a meal. Patient uses this medication for  pain     omeprazole 20 MG capsule  Commonly known as:  PRILOSEC  Take 20 mg by mouth daily. Patient uses this medication for acid reflux.     **NEW: oxyCODONE 5 MG immediate release tablet**  Commonly known as:  Oxy IR/ROXICODONE  Take 1-2 tablets (5-10 mg total) by mouth every 6 (six) hours as needed (pain).     simethicone 80 MG chewable tablet  Commonly known as:  MYLICON  Chew 1 tablet (80 mg total) by mouth 4 (four) times daily as needed for  flatulence.     tamsulosin 0.4 MG Caps capsule  Commonly known as:  FLOMAX  Take 1 capsule (0.4 mg total) by mouth daily.     traZODone 50 MG tablet  Commonly known as:  DESYREL  Take 200 mg by mouth at bedtime as needed for sleep. For sleep     VITAMIN D (CHOLECALCIFEROL) PO  Take 1 tablet by mouth daily.           Signed: Coralie Keens, [facility] [phone]  05/03/2014, 9:44 AM

## 2014-05-03 NOTE — Discharge Summary (Signed)
James Sweatman, MD, MPH, FACS Trauma: [phone] General Surgery: [phone]  

## 2014-05-03 NOTE — Progress Notes (Signed)
D/c with wife to home via wheelchair. Pain denied. VSS. Patient demonstrated understaning of JP care via teach back.

## 2014-05-03 NOTE — Telephone Encounter (Signed)
LMOM for pt of appt for next weds sept 2 for drain ck per [facility].

## 2014-05-10 ENCOUNTER — Encounter (INDEPENDENT_AMBULATORY_CARE_PROVIDER_SITE_OTHER): Payer: Medicare Other | Admitting: General Surgery

## 2014-05-22 ENCOUNTER — Encounter (INDEPENDENT_AMBULATORY_CARE_PROVIDER_SITE_OTHER): Payer: Medicare Other | Admitting: General Surgery

## 2014-05-25 ENCOUNTER — Telehealth: Payer: Self-pay | Admitting: *Deleted

## 2014-05-25 NOTE — Telephone Encounter (Signed)
Pt came in office as walkin as TRIAGE and I took him back to talk to him and he mentioned that he may be impacted and needs someone to help relieve him by helping with doing enema. I explained to patient that we do not do enemas in this office and that he would need to go to nearest ED, pt was pale in face, clammy, no fever. Pt stated that he will NOT go to [facility] and that he would just go to [facility] to the [place] there. I explained to him that he should go to nearest and he was adament that was not going to [facility]. Pt is on way to [facility] to the [facility] hospital.

## 2014-12-08 ENCOUNTER — Ambulatory Visit (INDEPENDENT_AMBULATORY_CARE_PROVIDER_SITE_OTHER): Payer: Medicare Other | Admitting: Family Medicine

## 2014-12-08 ENCOUNTER — Encounter: Payer: Self-pay | Admitting: Family Medicine

## 2014-12-08 VITALS — BP 110/70 | HR 88 | Temp 98.0°F | Resp 18 | Wt 230.0 lb

## 2014-12-08 DIAGNOSIS — J208 Acute bronchitis due to other specified organisms: Secondary | ICD-10-CM

## 2014-12-08 MED ORDER — ALBUTEROL SULFATE HFA 108 (90 BASE) MCG/ACT IN AERS: 2.0000 | INHALATION_SPRAY | Freq: Four times a day (QID) | RESPIRATORY_TRACT | Status: DC | PRN

## 2014-12-08 MED ORDER — AZITHROMYCIN 250 MG PO TABS: ORAL_TABLET | ORAL | Status: DC

## 2014-12-08 NOTE — Progress Notes (Signed)
Subjective:    Patient ID: James Fletcher, male    DOB: [birth year], 69 y.o.   MRN: [id number]  HPI  Patient is a 69 year old white male with a history of ICU admission for community-acquired pneumonia with systemic inflammatory response syndrome. He continues to smoke one pack of cigarettes per day. Patient reports a two-week history of cough productive of green sputum, wheezing, shortness of breath, and subjective fevers. He is also having pain and pressure in his sinuses and runny nose. He denies any chest pain. He does report wheezing. Past Medical History  Diagnosis Date  . Peripheral neuropathy   . Neuropathy 02/26/2012  . Left bundle-branch block 02/26/2012  . Cholecystitis    Past Surgical History  Procedure Laterality Date  . Appendectomy    . Hernia repair    . Cholecystectomy N/A 05/01/2014    Procedure: LAPAROSCOPIC CHOLECYSTECTOMY;  Surgeon: Zenovia Jarred, MD;  Location: [facility];  Service: General;  Laterality: N/A;   Current Outpatient Prescriptions on File Prior to Visit  Medication Sig Dispense Refill  . amLODipine (NORVASC) 10 MG tablet Take 10 mg by mouth daily.    . carbamazepine (TEGRETOL) 200 MG tablet Take 1 tablet (200 mg total) by mouth 2 (two) times daily. 60 tablet 1  . gabapentin (NEURONTIN) 400 MG capsule Take 1,200 mg by mouth 3 (three) times daily.     . Multiple Vitamins-Minerals (MULTIVITAMIN WITH MINERALS) tablet Take 1 tablet by mouth daily.    . naproxen (NAPROSYN) 500 MG tablet Take 500 mg by mouth 2 (two) times daily with a meal. Patient uses this medication for pain    . omeprazole (PRILOSEC) 20 MG capsule Take 20 mg by mouth daily. Patient uses this medication for acid reflux.    . traZODone (DESYREL) 50 MG tablet Take 200 mg by mouth at bedtime as needed for sleep. For sleep    . VITAMIN D, CHOLECALCIFEROL, PO Take 1 tablet by mouth daily.    Marland Kitchen aspirin 81 MG tablet Take 81 mg by mouth daily.    . simethicone (MYLICON) 80 MG chewable tablet Chew  1 tablet (80 mg total) by mouth 4 (four) times daily as needed for flatulence. 60 tablet 3  . tamsulosin (FLOMAX) 0.4 MG CAPS capsule Take 1 capsule (0.4 mg total) by mouth daily. (Patient not taking: Reported on 12/08/2014) 30 capsule 11   No current facility-administered medications on file prior to visit.   No Known Allergies History   Social History  . Marital Status: Married    Spouse Name: N/A  . Number of Children: N/A  . Years of Education: N/A   Occupational History  . Not on file.   Social History Main Topics  . Smoking status: Current Every Day Smoker -- 1.00 packs/day for 30 years    Types: Cigarettes  . Smokeless tobacco: Never Used  . Alcohol Use: No  . Drug Use: No  . Sexual Activity: Not on file   Other Topics Concern  . Not on file   Social History Narrative     Review of Systems  All other systems reviewed and are negative.      Objective:   Physical Exam  Constitutional: He appears well-developed and well-nourished. No distress.  HENT:  Right Ear: External ear normal.  Left Ear: External ear normal.  Nose: Nose normal.  Mouth/Throat: Oropharynx is clear and moist. No oropharyngeal exudate.  Eyes: Conjunctivae are normal. Pupils are equal, round, and reactive to light.  Neck:  Neck supple.  Cardiovascular: Normal rate, regular rhythm and normal heart sounds.   Pulmonary/Chest: Effort normal. No respiratory distress. He has wheezes. He has no rales. He exhibits no tenderness.  Abdominal: Soft. Bowel sounds are normal.  Lymphadenopathy:    He has no cervical adenopathy.  Skin: He is not diaphoretic.  Vitals reviewed.         Assessment & Plan:  Acute bronchitis due to other specified organisms - Plan: azithromycin (ZITHROMAX) 250 MG tablet, albuterol (PROVENTIL HFA;VENTOLIN HFA) 108 (90 BASE) MCG/ACT inhaler  I believe the patient has bronchitis. Given his past medical history as well as his smoking I will cover the patient for bacterial  bronchitis with Zithromax 500 mg by mouth daily 1 and 250 mg by mouth daily 2 through 5. I also want him to use albuterol 2 puffs inhaled every 6 hours as needed for wheezing. Recheck next week if no better or sooner if worse

## 2014-12-08 NOTE — Addendum Note (Signed)
Addended by: Shary Decamp B on: 12/08/2014 04:31 PM   Modules accepted: Orders

## 2014-12-19 ENCOUNTER — Telehealth: Payer: Self-pay | Admitting: Physician Assistant

## 2014-12-19 DIAGNOSIS — J208 Acute bronchitis due to other specified organisms: Secondary | ICD-10-CM

## 2014-12-19 NOTE — Telephone Encounter (Signed)
James Fletcher seen Dr Dennard Schaumann on 12/08/14 and he finished his zpack and he had started to feel better but now all of it is coming back and is wondering if he could have refill on zpack

## 2014-12-19 NOTE — Telephone Encounter (Signed)
Okay with one additional Z-Pak

## 2014-12-20 MED ORDER — AZITHROMYCIN 250 MG PO TABS: ORAL_TABLET | ORAL | Status: DC

## 2014-12-20 NOTE — Telephone Encounter (Signed)
Med sent to pharm and pt aware 

## 2015-07-06 ENCOUNTER — Encounter: Payer: Self-pay | Admitting: Family Medicine

## 2015-07-06 ENCOUNTER — Ambulatory Visit (INDEPENDENT_AMBULATORY_CARE_PROVIDER_SITE_OTHER): Payer: Medicare Other | Admitting: Family Medicine

## 2015-07-06 VITALS — BP 126/60 | HR 82 | Temp 98.2°F | Resp 20 | Ht 71.5 in | Wt 231.0 lb

## 2015-07-06 DIAGNOSIS — J208 Acute bronchitis due to other specified organisms: Secondary | ICD-10-CM

## 2015-07-06 MED ORDER — LEVOFLOXACIN 500 MG PO TABS: 500.0000 mg | ORAL_TABLET | Freq: Every day | ORAL | Status: DC

## 2015-07-06 MED ORDER — ALBUTEROL SULFATE HFA 108 (90 BASE) MCG/ACT IN AERS: 2.0000 | INHALATION_SPRAY | Freq: Four times a day (QID) | RESPIRATORY_TRACT | Status: DC | PRN

## 2015-07-06 MED ORDER — HYDROCODONE-HOMATROPINE 5-1.5 MG/5ML PO SYRP: 5.0000 mL | ORAL_SOLUTION | Freq: Three times a day (TID) | ORAL | Status: DC | PRN

## 2015-07-06 NOTE — Progress Notes (Signed)
Subjective:    Patient ID: James Fletcher, male    DOB: June 15, 1946, 69 y.o.   MRN: [id number]  HPI  his symptoms began Monday with cough. The cough is productive of clear and white mucus. He reports chest congestion that he is having a difficult time breaking up and coughing up. He also reports some mild shortness of breath. He denies any fevers or chills or pleurisy or hemoptysis. On exam today he does have some faint expiratory wheezing. He does have a 30-pack-year history of smoking although he has no documented history of COPD or asthma. In the past he has been given albuterol for "bronchitis" Past Medical History  Diagnosis Date  . Peripheral neuropathy   . Neuropathy 02/26/2012  . Left bundle-branch block 02/26/2012  . Cholecystitis    Past Surgical History  Procedure Laterality Date  . Appendectomy    . Hernia repair    . Cholecystectomy N/A 05/01/2014    Procedure: LAPAROSCOPIC CHOLECYSTECTOMY;  Surgeon: Zenovia Jarred, MD;  Location: [facility];  Service: General;  Laterality: N/A;   Current Outpatient Prescriptions on File Prior to Visit  Medication Sig Dispense Refill  . albuterol (PROVENTIL HFA;VENTOLIN HFA) 108 (90 BASE) MCG/ACT inhaler Inhale 2 puffs into the lungs every 6 (six) hours as needed for wheezing or shortness of breath. 1 Inhaler 2  . amLODipine (NORVASC) 10 MG tablet Take 10 mg by mouth daily.    Marland Kitchen aspirin 81 MG tablet Take 81 mg by mouth daily.    Marland Kitchen azithromycin (ZITHROMAX) 250 MG tablet 2 tabs poqday1, 1 tab poqday 2-5 6 tablet 0  . carbamazepine (TEGRETOL) 200 MG tablet Take 1 tablet (200 mg total) by mouth 2 (two) times daily. 60 tablet 1  . gabapentin (NEURONTIN) 400 MG capsule Take 1,200 mg by mouth 3 (three) times daily.     . Multiple Vitamins-Minerals (MULTIVITAMIN WITH MINERALS) tablet Take 1 tablet by mouth daily.    . naproxen (NAPROSYN) 500 MG tablet Take 500 mg by mouth 2 (two) times daily with a meal. Patient uses this medication for pain    .  omeprazole (PRILOSEC) 20 MG capsule Take 20 mg by mouth daily. Patient uses this medication for acid reflux.    . simethicone (MYLICON) 80 MG chewable tablet Chew 1 tablet (80 mg total) by mouth 4 (four) times daily as needed for flatulence. 60 tablet 3  . tamsulosin (FLOMAX) 0.4 MG CAPS capsule Take 1 capsule (0.4 mg total) by mouth daily. (Patient not taking: Reported on 12/08/2014) 30 capsule 11  . traZODone (DESYREL) 50 MG tablet Take 200 mg by mouth at bedtime as needed for sleep. For sleep    . VITAMIN D, CHOLECALCIFEROL, PO Take 1 tablet by mouth daily.     No current facility-administered medications on file prior to visit.   No Known Allergies Social History   Social History  . Marital Status: Married    Spouse Name: N/A  . Number of Children: N/A  . Years of Education: N/A   Occupational History  . Not on file.   Social History Main Topics  . Smoking status: Current Every Day Smoker -- 1.00 packs/day for 30 years    Types: Cigarettes  . Smokeless tobacco: Never Used  . Alcohol Use: No  . Drug Use: No  . Sexual Activity: Not on file   Other Topics Concern  . Not on file   Social History Narrative     Review of Systems  All other  systems reviewed and are negative.      Objective:   Physical Exam  Constitutional: He appears well-developed and well-nourished. No distress.  HENT:  Right Ear: External ear normal.  Left Ear: External ear normal.  Nose: Nose normal.  Mouth/Throat: Oropharynx is clear and moist. No oropharyngeal exudate.  Eyes: Conjunctivae are normal.  Neck: Neck supple.  Cardiovascular: Normal rate, regular rhythm and normal heart sounds.   No murmur heard. Pulmonary/Chest: Effort normal. No respiratory distress. He has wheezes. He has no rales.  Lymphadenopathy:    He has no cervical adenopathy.  Skin: He is not diaphoretic.  Vitals reviewed.         Assessment & Plan:  Acute bronchitis due to other specified organisms - Plan:  levofloxacin (LEVAQUIN) 500 MG tablet, albuterol (PROVENTIL HFA;VENTOLIN HFA) 108 (90 BASE) MCG/ACT inhaler, HYDROcodone-homatropine (HYCODAN) 5-1.5 MG/5ML syrup   I believe the patient has underlying viral bronchitis and likely has an element of emphysema. Therefore I recommended albuterol 2 puffs inhaled every 6 hours as needed for wheezing or coughing. He can take Robitussin-DM to help break up the congestion and improve his cough. I will give him Hycodan to take at night to help suppress his cough at night that he can sleep better. At the present time his symptoms are mild and I see no indication for prednisone. I also do not believe this is a bacterial infection given the lack of purulent sputum, high-grade fever, or shortness of breath. Therefore I gave the patient prescription for Levaquin with strict instructions not to fill the prescription unless he develops purulent sputum or high-grade fever. Recheck next week if no better or sooner if worse

## 2015-10-25 ENCOUNTER — Ambulatory Visit (INDEPENDENT_AMBULATORY_CARE_PROVIDER_SITE_OTHER): Payer: Medicare Other | Admitting: Family Medicine

## 2015-10-25 ENCOUNTER — Encounter: Payer: Self-pay | Admitting: Family Medicine

## 2015-10-25 VITALS — BP 118/60 | HR 80 | Temp 98.3°F | Resp 16 | Ht 71.0 in | Wt 229.0 lb

## 2015-10-25 DIAGNOSIS — M25562 Pain in left knee: Secondary | ICD-10-CM | POA: Diagnosis not present

## 2015-10-25 DIAGNOSIS — M25561 Pain in right knee: Secondary | ICD-10-CM

## 2015-10-25 NOTE — Progress Notes (Signed)
Subjective:    Patient ID: James Fletcher, male    DOB: 1946/02/01, 70 y.o.   MRN: [id number]  HPI   patient has a history of arthritis in his left knee. He states that he has had several x-rays obtained of his left knee at the [place]. Per his report they have only showed arthritis. He has been receiving cortisone injections in his left knee periodically at the [place] but he comes here today for convenience requesting a cortisone injection in his left knee. He states that the last cortisone injection he had in his left knee was more than 6 months ago at the [place]. Pain is located over the medial and lateral joint line anteriorly. There is no laxity in the knee joint to varus or valgus stress. He has a negative anterior and posterior drawer sign. He has a negative Apley grind. He also reports more than 30 years of chronic low back pain. He attributes it to injuries he sustained while in [place]. He has not yet had any x-rays obtained of his lower back under my care Past Medical History  Diagnosis Date  . Peripheral neuropathy ([place])   . Neuropathy ([facility]) 02/26/2012  . Left bundle-branch block 02/26/2012  . Cholecystitis    Past Surgical History  Procedure Laterality Date  . Appendectomy    . Hernia repair    . Cholecystectomy N/A 05/01/2014    Procedure: LAPAROSCOPIC CHOLECYSTECTOMY;  Surgeon: Zenovia Jarred, MD;  Location: [facility];  Service: General;  Laterality: N/A;   Current Outpatient Prescriptions on File Prior to Visit  Medication Sig Dispense Refill  . albuterol (PROVENTIL HFA;VENTOLIN HFA) 108 (90 BASE) MCG/ACT inhaler Inhale 2 puffs into the lungs every 6 (six) hours as needed for wheezing or shortness of breath. 1 Inhaler 2  . albuterol (PROVENTIL HFA;VENTOLIN HFA) 108 (90 BASE) MCG/ACT inhaler Inhale 2 puffs into the lungs every 6 (six) hours as needed for wheezing or shortness of breath. 1 Inhaler 0  . amLODipine (NORVASC) 10 MG tablet Take 10 mg by mouth daily.    Marland Kitchen aspirin 81 MG tablet  Take 81 mg by mouth daily.    . carbamazepine (TEGRETOL) 200 MG tablet Take 1 tablet (200 mg total) by mouth 2 (two) times daily. 60 tablet 1  . gabapentin (NEURONTIN) 400 MG capsule Take 1,200 mg by mouth 3 (three) times daily.     . Multiple Vitamins-Minerals (MULTIVITAMIN WITH MINERALS) tablet Take 1 tablet by mouth daily.    . naproxen (NAPROSYN) 500 MG tablet Take 500 mg by mouth 2 (two) times daily with a meal. Patient uses this medication for pain    . omeprazole (PRILOSEC) 20 MG capsule Take 20 mg by mouth daily. Patient uses this medication for acid reflux.    . traZODone (DESYREL) 50 MG tablet Take 200 mg by mouth at bedtime as needed for sleep. For sleep    . VITAMIN D, CHOLECALCIFEROL, PO Take 1 tablet by mouth daily.     No current facility-administered medications on file prior to visit.   No Known Allergies Social History   Social History  . Marital Status: Married    Spouse Name: N/A  . Number of Children: N/A  . Years of Education: N/A   Occupational History  . Not on file.   Social History Main Topics  . Smoking status: Current Every Day Smoker -- 1.00 packs/day for 30 years    Types: Cigarettes  . Smokeless tobacco: Never Used  . Alcohol  Use: No  . Drug Use: No  . Sexual Activity: Not on file   Other Topics Concern  . Not on file   Social History Narrative     Review of Systems  All other systems reviewed and are negative.      Objective:   Physical Exam  Constitutional: He appears well-developed and well-nourished. No distress.  Neck: Neck supple.  Cardiovascular: Normal rate, regular rhythm and normal heart sounds.   No murmur heard. Pulmonary/Chest: Effort normal. No respiratory distress. He has no wheezes. He has no rales.  Musculoskeletal:       Left knee: He exhibits no LCL laxity, no bony tenderness, normal meniscus and no MCL laxity. Tenderness found. Medial joint line and lateral joint line tenderness noted. No MCL and no LCL tenderness  noted.  Skin: He is not diaphoretic.  Vitals reviewed.         Assessment & Plan:  Right anterior knee pain  I suspect left knee pain secondary to osteoarthritis. Using sterile technique, I injected his left knee with a mixture of 2 mL of lidocaine, 2 mL of Marcaine, and 2 mL of 40 mg per mL Kenalog. He tolerated the procedure well with no complication. I recommended that we obtain x-rays of the lumbar spine should his back pain worsened.

## 2018-02-18 ENCOUNTER — Ambulatory Visit (INDEPENDENT_AMBULATORY_CARE_PROVIDER_SITE_OTHER): Payer: Medicare Other | Admitting: Family Medicine

## 2018-02-18 ENCOUNTER — Encounter: Payer: Self-pay | Admitting: Family Medicine

## 2018-02-18 VITALS — BP 120/70 | HR 68 | Temp 98.1°F | Resp 18 | Ht 71.0 in | Wt 221.0 lb

## 2018-02-18 DIAGNOSIS — M170 Bilateral primary osteoarthritis of knee: Secondary | ICD-10-CM | POA: Diagnosis not present

## 2018-02-18 NOTE — Progress Notes (Signed)
Subjective:    Patient ID: James Fletcher, male    DOB: 1945-12-26, 72 y.o.   MRN: [id number]  HPI Patient presents with a long-standing history of bilateral knee pain.  He states that he goes to the [facility] and that they performed x-rays of both knees and that the right knee is nearing the stage of requiring a knee replacement.  The left knee is not quite as bad.  He was unable to tolerate naproxen due to side effects.  He states that the knees hurt medially greater than laterally.  It hurts with prolonged standing and walking.  It hurts to climb stairs.  It hurts to rise from seated position.  In general they both hurt.  He is requesting a cortisone injection in both knees. Past Medical History:  Diagnosis Date  . Cholecystitis   . Left bundle-branch block 02/26/2012  . Neuropathy 02/26/2012  . Peripheral neuropathy    Past Surgical History:  Procedure Laterality Date  . APPENDECTOMY    . CHOLECYSTECTOMY N/A 05/01/2014   Procedure: LAPAROSCOPIC CHOLECYSTECTOMY;  Surgeon: Zenovia Jarred, MD;  Location: [facility];  Service: General;  Laterality: N/A;  . HERNIA REPAIR     Current Outpatient Medications on File Prior to Visit  Medication Sig Dispense Refill  . albuterol (PROVENTIL HFA;VENTOLIN HFA) 108 (90 BASE) MCG/ACT inhaler Inhale 2 puffs into the lungs every 6 (six) hours as needed for wheezing or shortness of breath. 1 Inhaler 0  . amLODipine (NORVASC) 10 MG tablet Take 10 mg by mouth daily.    Marland Kitchen aspirin 81 MG tablet Take 81 mg by mouth daily.    . carbamazepine (TEGRETOL) 200 MG tablet Take 1 tablet (200 mg total) by mouth 2 (two) times daily. 60 tablet 1  . gabapentin (NEURONTIN) 400 MG capsule Take 1,200 mg by mouth 3 (three) times daily.     . Multiple Vitamins-Minerals (MULTIVITAMIN WITH MINERALS) tablet Take 1 tablet by mouth daily.    . naproxen (NAPROSYN) 500 MG tablet Take 500 mg by mouth 2 (two) times daily with a meal. Patient uses this medication for pain    . omeprazole  (PRILOSEC) 20 MG capsule Take 20 mg by mouth daily. Patient uses this medication for acid reflux.    . traZODone (DESYREL) 50 MG tablet Take 200 mg by mouth at bedtime as needed for sleep. For sleep    . VITAMIN D, CHOLECALCIFEROL, PO Take 1 tablet by mouth daily.    Marland Kitchen albuterol (PROVENTIL HFA;VENTOLIN HFA) 108 (90 BASE) MCG/ACT inhaler Inhale 2 puffs into the lungs every 6 (six) hours as needed for wheezing or shortness of breath. 1 Inhaler 2   No current facility-administered medications on file prior to visit.    No Known Allergies Social History   Socioeconomic History  . Marital status: Married    Spouse name: Not on file  . Number of children: Not on file  . Years of education: Not on file  . Highest education level: Not on file  Occupational History  . Not on file  Social Needs  . Financial resource strain: Not on file  . Food insecurity:    Worry: Not on file    Inability: Not on file  . Transportation needs:    Medical: Not on file    Non-medical: Not on file  Tobacco Use  . Smoking status: Current Every Day Smoker    Packs/day: 1.00    Years: 30.00    Pack years: 30.00  Types: Cigarettes  . Smokeless tobacco: Never Used  Substance and Sexual Activity  . Alcohol use: No  . Drug use: No  . Sexual activity: Not on file  Lifestyle  . Physical activity:    Days per week: Not on file    Minutes per session: Not on file  . Stress: Not on file  Relationships  . Social connections:    Talks on phone: Not on file    Gets together: Not on file    Attends religious service: Not on file    Active member of club or organization: Not on file    Attends meetings of clubs or organizations: Not on file    Relationship status: Not on file  . Intimate partner violence:    Fear of current or ex partner: Not on file    Emotionally abused: Not on file    Physically abused: Not on file    Forced sexual activity: Not on file  Other Topics Concern  . Not on file  Social  History Narrative  . Not on file      Review of Systems  All other systems reviewed and are negative.      Objective:   Physical Exam  Constitutional: He appears well-developed and well-nourished.  Cardiovascular: Normal rate, regular rhythm and normal heart sounds.  Pulmonary/Chest: Effort normal and breath sounds normal.  Musculoskeletal:       Right knee: He exhibits decreased range of motion. He exhibits no swelling, no effusion, no erythema, no LCL laxity and no MCL laxity. Tenderness found. Medial joint line and lateral joint line tenderness noted. No MCL tenderness noted.       Left knee: He exhibits decreased range of motion. He exhibits no swelling, no effusion, no LCL laxity and no MCL laxity. Tenderness found. Medial joint line and lateral joint line tenderness noted.  Vitals reviewed.         Assessment & Plan:  Bilateral primary osteoarthritis of knee  Using sterile technique after obtaining informed consent, I injected the left knee with 2 cc lidocaine, 2 cc of Marcaine, and 2 cc of 40 mg/mL Kenalog.  The patient tolerated the procedure well without complication.  I then injected his right knee.  Sterile technique was used.  The patient was injected with 2 cc of lidocaine, 2 cc of Marcaine, and 2 cc of 40 mg/mL Kenalog.  There were no complications.

## 2018-07-09 ENCOUNTER — Encounter: Payer: Self-pay | Admitting: Family Medicine

## 2018-07-09 ENCOUNTER — Ambulatory Visit (INDEPENDENT_AMBULATORY_CARE_PROVIDER_SITE_OTHER): Payer: Medicare Other | Admitting: Family Medicine

## 2018-07-09 ENCOUNTER — Other Ambulatory Visit: Payer: Self-pay

## 2018-07-09 VITALS — BP 142/80 | HR 68 | Temp 97.9°F | Resp 16 | Ht 71.0 in | Wt 222.0 lb

## 2018-07-09 DIAGNOSIS — H1132 Conjunctival hemorrhage, left eye: Secondary | ICD-10-CM | POA: Diagnosis not present

## 2018-07-09 DIAGNOSIS — H109 Unspecified conjunctivitis: Secondary | ICD-10-CM | POA: Diagnosis not present

## 2018-07-09 DIAGNOSIS — J0140 Acute pansinusitis, unspecified: Secondary | ICD-10-CM | POA: Diagnosis not present

## 2018-07-09 DIAGNOSIS — J209 Acute bronchitis, unspecified: Secondary | ICD-10-CM | POA: Diagnosis not present

## 2018-07-09 MED ORDER — CETIRIZINE HCL 10 MG PO TBDP: 10.0000 mg | ORAL_TABLET | Freq: Every day | ORAL | 0 refills | Status: DC

## 2018-07-09 MED ORDER — FLUTICASONE PROPIONATE 50 MCG/ACT NA SUSP: 2.0000 | Freq: Every day | NASAL | 2 refills | Status: DC

## 2018-07-09 MED ORDER — AMOXICILLIN-POT CLAVULANATE 875-125 MG PO TABS: 1.0000 | ORAL_TABLET | Freq: Two times a day (BID) | ORAL | 0 refills | Status: AC

## 2018-07-09 MED ORDER — CROMOLYN SODIUM 4 % OP SOLN: 1.0000 [drp] | Freq: Four times a day (QID) | OPHTHALMIC | 12 refills | Status: DC | PRN

## 2018-07-09 MED ORDER — BENZONATATE 100 MG PO CAPS: 100.0000 mg | ORAL_CAPSULE | Freq: Three times a day (TID) | ORAL | 0 refills | Status: DC | PRN

## 2018-07-09 MED ORDER — PREDNISONE 20 MG PO TABS: 40.0000 mg | ORAL_TABLET | Freq: Every day | ORAL | 0 refills | Status: AC

## 2018-07-09 MED ORDER — GUAIFENESIN ER 600 MG PO TB12: 600.0000 mg | ORAL_TABLET | Freq: Two times a day (BID) | ORAL | 0 refills | Status: AC

## 2018-07-09 MED ORDER — POLYMYXIN B-TRIMETHOPRIM 10000-0.1 UNIT/ML-% OP SOLN: 1.0000 [drp] | Freq: Four times a day (QID) | OPHTHALMIC | 0 refills | Status: DC

## 2018-07-09 NOTE — Progress Notes (Signed)
Patient ID: James Fletcher, male    DOB: Feb 11, 1946, 72 y.o.   MRN: [id number]  PCP: Orlena Sheldon, PA-C  Chief Complaint  Patient presents with  . Illness    x1 week- HA, prodctive cough with yellow to clear mucus  . Eye Irritation    x1 week- L eye irritation with clear to milky colored drainage- crusted over in AM    Subjective:   James Fletcher is a 72 y.o. male, presents to clinic with CC of one week of worsening illness with bilateral eye itching - left eye has become very red and swollen with some morning crusting.  He also has 1 week of worsening nasal congestion, nasal discharge postnasal drip, over week of productive cough where mucus was brown to green and is starting to clear up but he still coughing very frequently.  He is a current smoker, he denies any history of COPD or using inhalers frequently for bronchitis.  He denies any wheeze or shortness of breath, denies chest pain. His wife believes that he has allergies all the time and he should be on daily allergy medicine he states that he is constantly sneezing blowing his nose, having nasal drainage and sniffing, at night he has postnasal drip and is always coughing, only worsen with change of the season and temperature.  Regarding his eyes his left eye lids are swollen and inside his conjunctive is bright red he has more eye itching than he has any pain he denies any blurry vision or pain with eye movement.  Swollen parts of his eyelids are not tender and or painful.  He has not tried medications yet.  He has done warm compresses a few times which helps clear some of the crusting but otherwise has not made a big difference.      Patient Active Problem List   Diagnosis Date Noted  . Cholecystitis with cholelithiasis 05/01/2014  . Hypokalemia 02/27/2012  . Abdominal pain 02/27/2012  . Fever 02/27/2012  . Leukopenia with low lymphocyte count 02/27/2012  . Anemia due to acute illness  02/27/2012  . Rhabdomyolysis  02/27/2012  . SIRS (systemic inflammatory response syndrome) ([place]) 02/26/2012  . Acute respiratory failure with hypoxia ([place]) 02/26/2012  . Community acquired pneumonia 02/26/2012  . Dehydration with hyponatremia 02/26/2012  . Abnormal transaminases 02/26/2012  . Peripheral neuropathy- unspecified 02/26/2012  . Left bundle-branch block 02/26/2012  . Thrombocytopenia ([place]) 02/26/2012     Prior to Admission medications   Medication Sig Start Date End Date Taking? Authorizing Provider  albuterol (PROVENTIL HFA;VENTOLIN HFA) 108 (90 BASE) MCG/ACT inhaler Inhale 2 puffs into the lungs every 6 (six) hours as needed for wheezing or shortness of breath. 12/08/14  Yes Susy Frizzle, MD  amLODipine (NORVASC) 10 MG tablet Take 10 mg by mouth daily.   Yes [provider]  aspirin 81 MG tablet Take 81 mg by mouth daily.   Yes [provider]  carbamazepine (TEGRETOL) 200 MG tablet Take 1 tablet (200 mg total) by mouth 2 (two) times daily. 03/05/12  Yes Rai, Ripudeep K, MD  gabapentin (NEURONTIN) 400 MG capsule Take 1,200 mg by mouth 3 (three) times daily.    Yes [provider]  Multiple Vitamins-Minerals (MULTIVITAMIN WITH MINERALS) tablet Take 1 tablet by mouth daily.   Yes [provider]  naproxen (NAPROSYN) 500 MG tablet Take 500 mg by mouth 2 (two) times daily with a meal. Patient uses this medication for pain   Yes  [provider]  omeprazole (PRILOSEC) 20 MG capsule Take 20 mg by mouth daily. Patient uses this medication for acid reflux.   Yes [provider]  traZODone (DESYREL) 50 MG tablet Take 200 mg by mouth at bedtime as needed for sleep. For sleep   Yes [provider]  VITAMIN D, CHOLECALCIFEROL, PO Take 1 tablet by mouth daily.   Yes [provider]     No Known Allergies   History reviewed. No pertinent family history.   Social History   Socioeconomic History  . Marital status: Married    Spouse name:  Not on file  . Number of children: Not on file  . Years of education: Not on file  . Highest education level: Not on file  Occupational History  . Not on file  Social Needs  . Financial resource strain: Not on file  . Food insecurity:    Worry: Not on file    Inability: Not on file  . Transportation needs:    Medical: Not on file    Non-medical: Not on file  Tobacco Use  . Smoking status: Current Every Day Smoker    Packs/day: 1.00    Years: 30.00    Pack years: 30.00    Types: Cigarettes  . Smokeless tobacco: Never Used  Substance and Sexual Activity  . Alcohol use: No  . Drug use: No  . Sexual activity: Not on file  Lifestyle  . Physical activity:    Days per week: Not on file    Minutes per session: Not on file  . Stress: Not on file  Relationships  . Social connections:    Talks on phone: Not on file    Gets together: Not on file    Attends religious service: Not on file    Active member of club or organization: Not on file    Attends meetings of clubs or organizations: Not on file    Relationship status: Not on file  . Intimate partner violence:    Fear of current or ex partner: Not on file    Emotionally abused: Not on file    Physically abused: Not on file    Forced sexual activity: Not on file  Other Topics Concern  . Not on file  Social History Narrative  . Not on file     Review of Systems  Constitutional: Negative.   HENT: Negative.   Eyes: Negative.   Respiratory: Negative.   Cardiovascular: Negative.   Gastrointestinal: Negative.   Endocrine: Negative.   Genitourinary: Negative.   Musculoskeletal: Negative.   Skin: Negative.   Allergic/Immunologic: Negative.   Neurological: Negative.   Hematological: Negative.   Psychiatric/Behavioral: Negative.   All other systems reviewed and are negative.      Objective:    Vitals:   07/09/18 1209  BP: (!) 142/80  Pulse: 68  Resp: 16  Temp: 97.9 F (36.6 C)  TempSrc: Oral  SpO2: 97%    Weight: 222 lb (100.7 kg)  Height: 5\' 11"  (1.803 m)      Physical Exam  Constitutional: He appears well-developed and well-nourished. No distress.  Mildly ill-appearing elderly male, appears stated age, no acute distress, nontoxic-appearing  HENT:  Head: Normocephalic and atraumatic.  Right Ear: External ear normal.  Left Ear: External ear normal.  Oropharynx mildly erythematous without exudate or edema Nasal mucosa very erythematous and moderately edematous with copious clear to green nasal discharge and congestion, mild sinus tenderness to palpation  Eyes: Pupils  are equal, round, and reactive to light. EOM are normal. Right eye exhibits no discharge and no exudate. Left eye exhibits chemosis. Left eye exhibits no discharge and no exudate. Right conjunctiva is injected. Left conjunctiva is injected. Left conjunctiva has a hemorrhage. Right eye exhibits normal extraocular motion and no nystagmus. Left eye exhibits normal extraocular motion and no nystagmus.  Bilateral conjunctival injection is very mild Left conjunctival hemorrhage over 50% of the inferior half of the conjunctive with some mild chemosis, left eyelids especially upper eyelid is very swollen but very soft and nontender to the touch with some erythema that appears more consistent with some petechia, no induration or erythema surrounding eye and no tenderness  Neck: Normal range of motion. No tracheal deviation present.  Cardiovascular: Normal rate, regular rhythm, normal heart sounds and intact distal pulses. Exam reveals no gallop and no friction rub.  No murmur heard. Pulmonary/Chest: Effort normal. No stridor. No respiratory distress. He has no wheezes. He has no rales. He exhibits no tenderness.  Frequent coughing, no respiratory distress, no accessory muscle use, no retractions, no wheeze rales or rhonchi, good breath sounds in all lung fields anteriorly and posteriorly auscultated  Abdominal: Soft. Bowel sounds are  normal. He exhibits no distension. There is no tenderness.  Musculoskeletal: Normal range of motion.  Lymphadenopathy:    He has no cervical adenopathy.  Neurological: He is alert. He exhibits normal muscle tone. Coordination normal.  Skin: Skin is warm and dry. No rash noted. He is not diaphoretic.  Psychiatric: He has a normal mood and affect. His behavior is normal.  Nursing note and vitals reviewed.         Assessment & Plan:      ICD-10-CM   1. Conjunctivitis of both eyes, unspecified conjunctivitis type H10.9 Cetirizine HCl (ZYRTEC ALLERGY) 10 MG TBDP    predniSONE (DELTASONE) 20 MG tablet    fluticasone (FLONASE) 50 MCG/ACT nasal spray    cromolyn (OPTICROM) 4 % ophthalmic solution  2. Conjunctival hemorrhage of left eye H11.32   3. Acute pansinusitis, recurrence not specified J01.40 Cetirizine HCl (ZYRTEC ALLERGY) 10 MG TBDP    predniSONE (DELTASONE) 20 MG tablet    amoxicillin-clavulanate (AUGMENTIN) 875-125 MG tablet    fluticasone (FLONASE) 50 MCG/ACT nasal spray  4. Acute bronchitis, unspecified organism J20.9 guaiFENesin (MUCINEX) 600 MG 12 hr tablet    benzonatate (TESSALON) 100 MG capsule    predniSONE (DELTASONE) 20 MG tablet    Patient with URI symptoms, bilateral conjunctivitis predominant symptom is itchiness, has caused a lot of swelling redness and bleeding including sub-conjunctival hemorrhage to his left eye, very minimal pain, EOMs PERRLA normal, no photophobia, no visual disturbances, suspect that bilateral conjunctivitis is predominantly allergic may also have a viral component with other viral upper respiratory symptoms including nasal congestion, nasal drainage, postnasal drip, mild posterior oropharyngeal erythema, and bronchitis.  We will treat conjunctivitis and rhinosinusitis with over-the-counter allergy medications and steroid nasal sprays as well as a steroid burst, do feel that symptoms have been ongoing long enough and have been acutely worsening  with some sinus tenderness to palpation so will treat with antibiotics for acute bacterial sinusitis as well with Augmentin.   Feel bronchitis is mild and will hopefully improve with some cough suppression, mucolytic's, steroid burst.  Patient does have fairly significant smoking history but no recurrent history of bronchitis, no known lung disease, no current or recent use of inhalers, patient encouraged to contact me follow-up if he has any new wheeze,  shortness of breath, fever or worsening chest congestion would like to reevaluate and possibly get him inhalers.  She was given Polytrim printed prescription to hold and only if develops bilateral purulent discharge in his eyes, patient was instructed to seek immediate follow-up if he has any eye pain or decreased vision.   Delsa Grana, PA-C 07/09/18 12:19 PM

## 2018-07-09 NOTE — Patient Instructions (Signed)
Do the allergy meds daily  flonase and zyrtec  Do allergy eye drops first - cromolyn If any drainage or puss gets worse then add polytrim  For sinuses and cough take today prednisone, antibiotics and cough meds as needed

## 2019-02-17 ENCOUNTER — Ambulatory Visit: Payer: Medicare Other | Admitting: Family Medicine

## 2019-02-18 ENCOUNTER — Ambulatory Visit (INDEPENDENT_AMBULATORY_CARE_PROVIDER_SITE_OTHER): Payer: Medicare Other | Admitting: Family Medicine

## 2019-02-18 ENCOUNTER — Other Ambulatory Visit: Payer: Self-pay

## 2019-02-18 ENCOUNTER — Encounter: Payer: Self-pay | Admitting: Family Medicine

## 2019-02-18 VITALS — BP 150/90 | HR 78 | Temp 98.4°F | Resp 16 | Ht 71.0 in | Wt 222.0 lb

## 2019-02-18 DIAGNOSIS — R296 Repeated falls: Secondary | ICD-10-CM | POA: Diagnosis not present

## 2019-02-18 DIAGNOSIS — D539 Nutritional anemia, unspecified: Secondary | ICD-10-CM | POA: Diagnosis not present

## 2019-02-18 DIAGNOSIS — R17 Unspecified jaundice: Secondary | ICD-10-CM | POA: Diagnosis not present

## 2019-02-18 DIAGNOSIS — G4710 Hypersomnia, unspecified: Secondary | ICD-10-CM | POA: Diagnosis not present

## 2019-02-18 DIAGNOSIS — D649 Anemia, unspecified: Secondary | ICD-10-CM | POA: Diagnosis not present

## 2019-02-18 MED ORDER — OXYCODONE HCL 5 MG PO TABS: 5.0000 mg | ORAL_TABLET | ORAL | 0 refills | Status: DC | PRN

## 2019-02-18 NOTE — Progress Notes (Signed)
Subjective:    Patient ID: James Fletcher, male    DOB: 1946/08/25, 73 y.o.   MRN: [id number]  HPI Patient receives the majority of his health care at the [facility].  I have not seen the patient other than for knee pain quite some time.  Upon entering the room I am struck by the patient's appearance.  He is slightly jaundiced.  He also appears to have some mild scleral icterus.  There are numerous bruises on the dorsums of both forearms.  There is a skin tear above the lateral epicondyles of his left elbow that is bleeding.  Patient is accompanied by his wife.  He states that he went to wash his hands approximately 4 AM today.  He fell asleep while washing his hands!.  He fell backward striking his back against the doorknob injuring his left lower rib and also injuring the lateral aspect of his left elbow as he fell to the floor.  He regained consciousness as he was following.  I question the patient that if he fell asleep or passed out.  He states that he falls asleep easily all the time.  His wife states that he falls asleep constantly day and night.  When I question if this is normal for him he states that he is not sleeping well.  He only gets 1 or 2 hours.  He is frequently awakened due to severe debilitating nerve pain which he seeks care at the [facility].  He is currently on gabapentin 1200 mg 3 times daily [facility] [place] he is also on nortriptyline 10 mg a day and Tegretol.  I question if he has sleep apnea and he states that he does not sleep long enough that he feels that this would be an issue.  His wife states that he is in constant pain and therefore is unable to sleep and she feels that this is why he is always so tired and falling asleep so readily.  She also admits that he is falling frequently.  He is fallen 3 times in the last week even without falling asleep.  He seems to be frequently losing his balance. Past Medical History:  Diagnosis Date  . Cholecystitis   . Left bundle-branch block  02/26/2012  . Neuropathy 02/26/2012  . Peripheral neuropathy    Past Surgical History:  Procedure Laterality Date  . APPENDECTOMY    . CHOLECYSTECTOMY N/A 05/01/2014   Procedure: LAPAROSCOPIC CHOLECYSTECTOMY;  Surgeon: Zenovia Jarred, MD;  Location: [facility];  Service: General;  Laterality: N/A;  . HERNIA REPAIR     Current Outpatient Medications on File Prior to Visit  Medication Sig Dispense Refill  . aspirin 81 MG tablet Take 81 mg by mouth daily.    . carbamazepine (TEGRETOL) 200 MG tablet Take 1 tablet (200 mg total) by mouth 2 (two) times daily. 60 tablet 1  . gabapentin (NEURONTIN) 400 MG capsule Take 1,200 mg by mouth 3 (three) times daily.     . Multiple Vitamins-Minerals (MULTIVITAMIN WITH MINERALS) tablet Take 1 tablet by mouth daily.    . naproxen (NAPROSYN) 500 MG tablet Take 500 mg by mouth 2 (two) times daily with a meal. Patient uses this medication for pain    . nortriptyline (PAMELOR) 10 MG capsule Take 10 mg by mouth at bedtime.    Marland Kitchen omeprazole (PRILOSEC) 20 MG capsule Take 20 mg by mouth daily. Patient uses this medication for acid reflux.    Marland Kitchen VITAMIN D, CHOLECALCIFEROL, PO Take 1  tablet by mouth daily.     No current facility-administered medications on file prior to visit.    No Known Allergies Social History   Socioeconomic History  . Marital status: Married    Spouse name: Not on file  . Number of children: Not on file  . Years of education: Not on file  . Highest education level: Not on file  Occupational History  . Not on file  Social Needs  . Financial resource strain: Not on file  . Food insecurity    Worry: Not on file    Inability: Not on file  . Transportation needs    Medical: Not on file    Non-medical: Not on file  Tobacco Use  . Smoking status: Current Every Day Smoker    Packs/day: 1.00    Years: 30.00    Pack years: 30.00    Types: Cigarettes  . Smokeless tobacco: Never Used  Substance and Sexual Activity  . Alcohol use: No  . Drug  use: No  . Sexual activity: Not on file  Lifestyle  . Physical activity    Days per week: Not on file    Minutes per session: Not on file  . Stress: Not on file  Relationships  . Social Herbalist on phone: Not on file    Gets together: Not on file    Attends religious service: Not on file    Active member of club or organization: Not on file    Attends meetings of clubs or organizations: Not on file    Relationship status: Not on file  . Intimate partner violence    Fear of current or ex partner: Not on file    Emotionally abused: Not on file    Physically abused: Not on file    Forced sexual activity: Not on file  Other Topics Concern  . Not on file  Social History Narrative  . Not on file      Review of Systems  All other systems reviewed and are negative.      Objective:   Physical Exam Vitals signs reviewed.  Constitutional:      General: He is not in acute distress.    Appearance: He is not ill-appearing or toxic-appearing.  Eyes:     General: Scleral icterus present.     Pupils: Pupils are equal, round, and reactive to light.  Cardiovascular:     Rate and Rhythm: Normal rate and regular rhythm.     Pulses: Normal pulses.     Heart sounds: Normal heart sounds.  Pulmonary:     Effort: Pulmonary effort is normal. No respiratory distress.     Breath sounds: Normal breath sounds. No stridor. No wheezing, rhonchi or rales.  Chest:     Chest wall: Tenderness present.  Abdominal:     General: Bowel sounds are normal. There is no distension.     Palpations: Abdomen is soft.     Tenderness: There is no abdominal tenderness. There is no guarding.  Musculoskeletal:     Left forearm: He exhibits laceration.     Right lower leg: No edema.     Left lower leg: No edema.  Skin:    Coloration: Skin is jaundiced.     Findings: Bruising present.  Neurological:     General: No focal deficit present.     Mental Status: He is alert and oriented to person,  place, and time.     Cranial Nerves: No cranial  nerve deficit.     Sensory: No sensory deficit.     Motor: No weakness.     Coordination: Coordination normal.     Gait: Gait normal.           Assessment & Plan:  1. Jaundice Patient appears jaundiced to me suggesting possible underlying liver failure.  Begin by obtaining a CBC and a CMP along with an ammonia level to evaluate further.  If there is elevations in his liver function test and bilirubin is I suspect, I would next recommend a right upper quadrant ultrasound and viral hepatitis panel to evaluate further.  I question if this may be contributing to the patient's frequent falls and hypersomnolence so I will also check an ammonia level - CBC with Differential/Platelet - COMPLETE METABOLIC PANEL WITH GFR - Ammonia  2. Falls frequently Patient may have broken his posterior left 11th rib.  I will give the patient oxycodone 5 mg every 6 hours as needed pain for pain.  He has been taking Tylenol for his nerve pain 3000 mg a day.  I recommended that he stop this immediately due to the jaundice.  My suspicion is that medication may be causing his frequent falls.  He is on 3600 mg of gabapentin for neuropathy and then has severe peripheral neuropathy all of which could contribute to this.  I recommended that he try to gradually decrease his dose of gabapentin 1st-8 100 mg 3 times daily and then further if tolerable to see if this helps with his balance issues  3. Hypersomnolence Likely multifactorial.  I believe polypharmacy and medication side effects may be causing part of this.  I am also concerned about his jaundice and perhaps hepatic dysfunction and encephalopathy contributing to hypersomnolence at times.  Also obstructive sleep apnea would be on the differential diagnosis.  Await the results of his lab work to determine the next step in work-up

## 2019-02-22 LAB — COMPLETE METABOLIC PANEL WITH GFR
AG Ratio: 1.9 (calc) (ref 1.0–2.5)
ALT: 21 U/L (ref 9–46)
AST: 19 U/L (ref 10–35)
Albumin: 3.9 g/dL (ref 3.6–5.1)
Alkaline phosphatase (APISO): 101 U/L (ref 35–144)
BUN/Creatinine Ratio: 13 (calc) (ref 6–22)
BUN: 8 mg/dL (ref 7–25)
CO2: 25 mmol/L (ref 20–32)
Calcium: 8.6 mg/dL (ref 8.6–10.3)
Chloride: 100 mmol/L (ref 98–110)
Creat: 0.62 mg/dL — ABNORMAL LOW (ref 0.70–1.18)
GFR, Est African American: 115 mL/min/{1.73_m2} (ref >=60)
GFR, Est Non African American: 99 mL/min/{1.73_m2} (ref >=60)
Globulin: 2.1 g/dL (calc) (ref 1.9–3.7)
Glucose, Bld: 84 mg/dL (ref 65–99)
Potassium: 4.5 mmol/L (ref 3.5–5.3)
Sodium: 134 mmol/L — ABNORMAL LOW (ref 135–146)
Total Bilirubin: 0.2 mg/dL (ref 0.2–1.2)
Total Protein: 6 g/dL — ABNORMAL LOW (ref 6.1–8.1)

## 2019-02-22 LAB — CBC WITH DIFFERENTIAL/PLATELET
Absolute Monocytes: 567 cells/uL (ref 200–950)
Basophils Absolute: 49 cells/uL (ref 0–200)
Basophils Relative: 0.7 %
Eosinophils Absolute: 112 cells/uL (ref 15–500)
Eosinophils Relative: 1.6 %
HCT: 28.9 % — ABNORMAL LOW (ref 38.5–50.0)
Hemoglobin: 9.7 g/dL — ABNORMAL LOW (ref 13.2–17.1)
Lymphs Abs: 1316 cells/uL (ref 850–3900)
MCH: 29.3 pg (ref 27.0–33.0)
MCHC: 33.6 g/dL (ref 32.0–36.0)
MCV: 87.3 fL (ref 80.0–100.0)
MPV: 9.5 fL (ref 7.5–12.5)
Monocytes Relative: 8.1 %
Neutro Abs: 4956 cells/uL (ref 1500–7800)
Neutrophils Relative %: 70.8 %
Platelets: 357 10*3/uL (ref 140–400)
RBC: 3.31 10*6/uL — ABNORMAL LOW (ref 4.20–5.80)
RDW: 13.4 % (ref 11.0–15.0)
Total Lymphocyte: 18.8 %
WBC: 7 10*3/uL (ref 3.8–10.8)

## 2019-02-22 LAB — AMMONIA: Ammonia: 62 umol/L (ref <=72)

## 2019-02-22 LAB — TEST AUTHORIZATION

## 2019-02-22 LAB — IRON: Iron: 24 ug/dL — ABNORMAL LOW (ref 50–180)

## 2019-02-22 LAB — VITAMIN B12: Vitamin B-12: 617 pg/mL (ref 200–1100)

## 2019-02-23 ENCOUNTER — Other Ambulatory Visit: Payer: Self-pay | Admitting: Family Medicine

## 2019-02-23 DIAGNOSIS — D508 Other iron deficiency anemias: Secondary | ICD-10-CM

## 2019-02-24 ENCOUNTER — Other Ambulatory Visit: Payer: Self-pay | Admitting: Family Medicine

## 2019-02-24 DIAGNOSIS — R160 Hepatomegaly, not elsewhere classified: Secondary | ICD-10-CM

## 2019-03-08 ENCOUNTER — Other Ambulatory Visit: Payer: Non-veteran care

## 2019-03-28 ENCOUNTER — Ambulatory Visit
Admission: RE | Admit: 2019-03-28 | Discharge: 2019-03-28 | Disposition: A | Discharge status: Home / Self Care | Payer: Medicare Other | Source: Ambulatory Visit | Attending: Family Medicine | Admitting: Family Medicine

## 2019-03-28 ENCOUNTER — Other Ambulatory Visit: Payer: Self-pay

## 2019-03-28 DIAGNOSIS — K8020 Calculus of gallbladder without cholecystitis without obstruction: Secondary | ICD-10-CM | POA: Diagnosis not present

## 2019-03-28 DIAGNOSIS — D1803 Hemangioma of intra-abdominal structures: Secondary | ICD-10-CM | POA: Diagnosis not present

## 2019-03-28 DIAGNOSIS — R160 Hepatomegaly, not elsewhere classified: Secondary | ICD-10-CM

## 2019-06-02 ENCOUNTER — Other Ambulatory Visit: Payer: Self-pay

## 2019-06-02 ENCOUNTER — Ambulatory Visit (INDEPENDENT_AMBULATORY_CARE_PROVIDER_SITE_OTHER): Payer: Medicare Other | Admitting: Family Medicine

## 2019-06-02 ENCOUNTER — Encounter: Payer: Self-pay | Admitting: Family Medicine

## 2019-06-02 VITALS — BP 142/70 | HR 76 | Temp 98.0°F | Resp 16 | Ht 71.0 in | Wt 211.0 lb

## 2019-06-02 DIAGNOSIS — R0600 Dyspnea, unspecified: Secondary | ICD-10-CM | POA: Diagnosis not present

## 2019-06-02 DIAGNOSIS — R053 Chronic cough: Secondary | ICD-10-CM

## 2019-06-02 DIAGNOSIS — R05 Cough: Secondary | ICD-10-CM | POA: Diagnosis not present

## 2019-06-02 MED ORDER — LOSARTAN POTASSIUM 50 MG PO TABS: 50.0000 mg | ORAL_TABLET | Freq: Every day | ORAL | 3 refills | Status: DC

## 2019-06-02 MED ORDER — FLUTICASONE PROPIONATE 50 MCG/ACT NA SUSP: 2.0000 | Freq: Every day | NASAL | 6 refills | Status: DC

## 2019-06-02 MED ORDER — BENZONATATE 200 MG PO CAPS: 200.0000 mg | ORAL_CAPSULE | Freq: Three times a day (TID) | ORAL | 0 refills | Status: DC | PRN

## 2019-06-02 NOTE — Progress Notes (Signed)
Subjective:    Patient ID: James Fletcher, male    DOB: [birth year], 73 y.o.   MRN: [id number]  HPI Patient received the majority of his health care at the [place].  He presents today complaining of "allergies".  However he reports a constant cough that is been present for several months.  He also reports coughing spells that leave him extremely short of breath and panic.  The cough is occasionally productive of yellow and white sputum.  He denies any pleurisy.  He denies any hemoptysis.  He states that he fell several weeks ago and went to the emergency room at the [facility] where a CT scan revealed fractured ribs on his left side and "a touch of pneumonia".  He was not given any antibiotics for the pneumonia.  He smokes a half a pack of cigarettes a day.  He states that he has been evaluated at the [facility] for possible COPD and was told that he does not have COPD.  The cough is more of a chronic tickle cough.  It is more of an irritant and an itch in the back of his throat.  He also reports postnasal drip and drainage and mucus that seems to become thick and dry and that he has a difficult time being able to bring up no matter how hard he coughs.  The cough is similar throughout the day and night.  He denies any acid reflux.  The cough does not get worse with supine.  He denies any fevers or chills or night sweats or weight loss. Past Medical History:  Diagnosis Date  . Cholecystitis   . Left bundle-branch block 02/26/2012  . Neuropathy 02/26/2012  . Peripheral neuropathy    Past Surgical History:  Procedure Laterality Date  . APPENDECTOMY    . CHOLECYSTECTOMY N/A 05/01/2014   Procedure: LAPAROSCOPIC CHOLECYSTECTOMY;  Surgeon: Zenovia Jarred, MD;  Location: [place];  Service: General;  Laterality: N/A;  . HERNIA REPAIR     Current Outpatient Medications on File Prior to Visit  Medication Sig Dispense Refill  . aspirin 81 MG tablet Take 81 mg by mouth daily.    . carbamazepine (TEGRETOL) 200 MG tablet Take 1  tablet (200 mg total) by mouth 2 (two) times daily. 60 tablet 1  . gabapentin (NEURONTIN) 400 MG capsule Take 1,200 mg by mouth 3 (three) times daily.     . Multiple Vitamins-Minerals (MULTIVITAMIN WITH MINERALS) tablet Take 1 tablet by mouth daily.    . naproxen (NAPROSYN) 500 MG tablet Take 500 mg by mouth 2 (two) times daily with a meal. Patient uses this medication for pain    . nortriptyline (PAMELOR) 10 MG capsule Take 10 mg by mouth at bedtime.    Marland Kitchen omeprazole (PRILOSEC) 20 MG capsule Take 20 mg by mouth daily. Patient uses this medication for acid reflux.    Marland Kitchen oxyCODONE (ROXICODONE) 5 MG immediate release tablet Take 1 tablet (5 mg total) by mouth every 4 (four) hours as needed for severe pain. 30 tablet 0  . VITAMIN D, CHOLECALCIFEROL, PO Take 1 tablet by mouth daily.     No current facility-administered medications on file prior to visit.    No Known Allergies Social History   Socioeconomic History  . Marital status: Married    Spouse name: Not on file  . Number of children: Not on file  . Years of education: Not on file  . Highest education level: Not on file  Occupational History  .  Not on file  Social Needs  . Financial resource strain: Not on file  . Food insecurity    Worry: Not on file    Inability: Not on file  . Transportation needs    Medical: Not on file    Non-medical: Not on file  Tobacco Use  . Smoking status: Current Every Day Smoker    Packs/day: 1.00    Years: 30.00    Pack years: 30.00    Types: Cigarettes  . Smokeless tobacco: Never Used  Substance and Sexual Activity  . Alcohol use: No  . Drug use: No  . Sexual activity: Not on file  Lifestyle  . Physical activity    Days per week: Not on file    Minutes per session: Not on file  . Stress: Not on file  Relationships  . Social Herbalist on phone: Not on file    Gets together: Not on file    Attends religious service: Not on file    Active member of club or organization: Not on  file    Attends meetings of clubs or organizations: Not on file    Relationship status: Not on file  . Intimate partner violence    Fear of current or ex partner: Not on file    Emotionally abused: Not on file    Physically abused: Not on file    Forced sexual activity: Not on file  Other Topics Concern  . Not on file  Social History Narrative  . Not on file      Review of Systems  All other systems reviewed and are negative.      Objective:   Physical Exam Vitals signs reviewed.  Constitutional:      General: He is not in acute distress.    Appearance: He is not ill-appearing or toxic-appearing.  Cardiovascular:     Rate and Rhythm: Normal rate and regular rhythm.     Pulses: Normal pulses.     Heart sounds: Normal heart sounds.  Pulmonary:     Effort: Pulmonary effort is normal. No respiratory distress.     Breath sounds: Normal breath sounds. No stridor. No wheezing, rhonchi or rales.  Chest:     Chest wall: No tenderness.  Abdominal:     General: Bowel sounds are normal. There is no distension.     Palpations: Abdomen is soft.     Tenderness: There is no abdominal tenderness. There is no guarding.  Musculoskeletal:     Right lower leg: No edema.     Left lower leg: No edema.  Neurological:     General: No focal deficit present.     Mental Status: He is alert and oriented to person, place, and time.     Cranial Nerves: No cranial nerve deficit.     Sensory: No sensory deficit.     Motor: No weakness.     Coordination: Coordination normal.     Gait: Gait normal.           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  He denies any reflux.  However he does report postnasal drip and drainage down his throat.  He stopped his antihistamines because they seem to "dry it out and make it thicker and harder to call follow.  Therefore  I will try the patient on Flonase 2 sprays  each nostril daily.  I also believe that the lisinopril is likely playing a role.  Therefore of asked the patient to discontinue lisinopril and replace with losartan 50 mg a day.  He can use Tessalon Perls 200 mg p.o. every 8 hours as needed cough.  Reassess in 2 weeks.  Of also requested the patient get a chest x-ray and if there is in fact pneumonia seen on chest x-ray I would recommend antibiotic treatment for this as well.  If the cough is no better, consider laryngo-esophageal reflux and starting a daily PPI.  Also consider PFTs at that point

## 2019-06-15 ENCOUNTER — Other Ambulatory Visit: Payer: Self-pay | Admitting: Family Medicine

## 2019-06-15 MED ORDER — BENZONATATE 200 MG PO CAPS: 200.0000 mg | ORAL_CAPSULE | Freq: Three times a day (TID) | ORAL | 0 refills | Status: DC | PRN

## 2019-06-15 MED ORDER — FLUTICASONE PROPIONATE 50 MCG/ACT NA SUSP: 2.0000 | Freq: Every day | NASAL | 6 refills | Status: DC

## 2019-06-21 ENCOUNTER — Other Ambulatory Visit: Payer: Self-pay

## 2019-06-21 ENCOUNTER — Ambulatory Visit (INDEPENDENT_AMBULATORY_CARE_PROVIDER_SITE_OTHER): Payer: Medicare Other | Admitting: Family Medicine

## 2019-06-21 VITALS — BP 144/78 | HR 69 | Temp 98.0°F | Resp 18 | Ht 71.0 in | Wt 214.0 lb

## 2019-06-21 DIAGNOSIS — R05 Cough: Secondary | ICD-10-CM

## 2019-06-21 DIAGNOSIS — R053 Chronic cough: Secondary | ICD-10-CM

## 2019-06-21 MED ORDER — PANTOPRAZOLE SODIUM 40 MG PO TBEC: 40.0000 mg | DELAYED_RELEASE_TABLET | Freq: Two times a day (BID) | ORAL | 3 refills | Status: DC

## 2019-06-21 MED ORDER — LEVOCETIRIZINE DIHYDROCHLORIDE 5 MG PO TABS: 5.0000 mg | ORAL_TABLET | Freq: Every evening | ORAL | 0 refills | Status: DC

## 2019-06-21 NOTE — Progress Notes (Signed)
Subjective:    Patient ID: James Fletcher, male    DOB: 18-Nov-1945, 73 y.o.   MRN: [id number]  HPI  06/02/19 Patient received the majority of his health care at the [place].  He presents today complaining of "allergies".  However he reports a constant cough that is been present for several months.  He also reports coughing spells that leave him extremely short of breath and panic.  The cough is occasionally productive of yellow and white sputum.  He denies any pleurisy.  He denies any hemoptysis.  He states that he fell several weeks ago and went to the emergency room at the [facility] where a CT scan revealed fractured ribs on his left side and "a touch of pneumonia".  He was not given any antibiotics for the pneumonia.  He smokes a half a pack of cigarettes a day.  He states that he has been evaluated at the [facility] for possible COPD and was told that he does not have COPD.  The cough is more of a chronic tickle cough.  It is more of an irritant and an itch in the back of his throat.  He also reports postnasal drip and drainage and mucus that seems to become thick and dry and that he has a difficult time being able to bring up no matter how hard he coughs.  The cough is similar throughout the day and night.  He denies any acid reflux.  The cough does not get worse with supine.  He denies any fevers or chills or night sweats or weight loss.  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  He denies any reflux.  However he does report postnasal drip and drainage down his throat.  He stopped his antihistamines because they seem to "dry it out and make it thicker and harder to call follow.  Therefore I will try the patient on Flonase 2 sprays each nostril daily.  I also believe that the lisinopril is likely playing a role.  Therefore of asked the patient to discontinue lisinopril and replace with losartan 50 mg a day.  He can use Tessalon Perls 200 mg p.o. every 8 hours as needed  cough.  Reassess in 2 weeks.  Of also requested the patient get a chest x-ray and if there is in fact pneumonia seen on chest x-ray I would recommend antibiotic treatment for this as well.  If the cough is no better, consider laryngo-esophageal reflux and starting a daily PPI.  Also consider PFTs at that point  06/21/19 Patient states that the cough has improved since he quit lisinopril however he continues to have coughing spells.  These primarily occur at night.  Patient states that he will feel strangled due to postnasal drainage.  This will trigger a coughing spell that causes him to sit up.  He believes anxiety could be contributing to some of the dyspnea.  He constantly feels like he has to clear his throat.  His wife states that he is coughing also during the day however the coughing spells are more frequent at night.  He denies any hemoptysis.  He denies any fever or chills or chest pain.  He states that his dyspnea has improved.  He denies any reflux.  However he does report constant postnasal drip and postnasal drainage.  He believes this is what is triggering the cough.  He never went for the chest x-ray as requested last time. Past Medical History:  Diagnosis Date  . Cholecystitis   . Left bundle-branch block 02/26/2012  . Neuropathy 02/26/2012  . Peripheral neuropathy    Past Surgical History:  Procedure Laterality Date  . APPENDECTOMY    . CHOLECYSTECTOMY N/A 05/01/2014   Procedure: LAPAROSCOPIC CHOLECYSTECTOMY;  Surgeon: Zenovia Jarred, MD;  Location: [place];  Service: General;  Laterality: N/A;  . HERNIA REPAIR     Current Outpatient Medications on File Prior to Visit  Medication Sig Dispense Refill  . aspirin 81 MG tablet Take 81 mg by mouth daily.    . benzonatate (TESSALON) 200 MG capsule Take 1 capsule (200 mg total) by mouth 3 (three) times daily as needed for cough. 30 capsule 0  . carbamazepine (TEGRETOL) 200 MG tablet Take 1 tablet (200 mg total) by mouth 2 (two) times  daily. 60 tablet 1  . fluticasone (FLONASE) 50 MCG/ACT nasal spray Place 2 sprays into both nostrils daily. 16 g 6  . gabapentin (NEURONTIN) 400 MG capsule Take 1,200 mg by mouth 3 (three) times daily.     . hydrALAZINE (APRESOLINE) 50 MG tablet Take 50 mg by mouth 3 (three) times daily.    Marland Kitchen lisinopril (ZESTRIL) 20 MG tablet Take 20 mg by mouth daily.    Marland Kitchen losartan (COZAAR) 50 MG tablet Take 1 tablet (50 mg total) by mouth daily. 90 tablet 3  . Multiple Vitamins-Minerals (MULTIVITAMIN WITH MINERALS) tablet Take 1 tablet by mouth daily.    Marland Kitchen omeprazole (PRILOSEC) 20 MG capsule Take 20 mg by mouth daily. Patient uses this medication for acid reflux.    . tamsulosin (FLOMAX) 0.4 MG CAPS capsule Take 0.4 mg by mouth.    Marland Kitchen VITAMIN D, CHOLECALCIFEROL, PO Take 1 tablet by mouth daily.     No current facility-administered medications on file prior to visit.    No Known Allergies Social History   Socioeconomic History  . Marital status: Married    Spouse name: Not on file  . Number of children: Not on file  . Years of education: Not on file  . Highest education level: Not on file  Occupational History  . Not on file  Social Needs  . Financial resource strain: Not on file  . Food insecurity    Worry: Not on file    Inability: Not on file  . Transportation needs    Medical: Not on file    Non-medical: Not on file  Tobacco Use  . Smoking status: Current Every Day Smoker    Packs/day: 1.00    Years: 30.00    Pack years: 30.00    Types: Cigarettes  . Smokeless tobacco: Never Used  Substance and Sexual Activity  . Alcohol use: No  . Drug use: No  . Sexual activity: Not on file  Lifestyle  . Physical activity    Days per week: Not on file    Minutes per session: Not on file  . Stress: Not on file  Relationships  . Social Herbalist on phone: Not on file    Gets together: Not on file    Attends religious service: Not on file    Active member of club or organization: Not  on file    Attends meetings of clubs or organizations: Not on file    Relationship status: Not on file  . Intimate partner violence    Fear of current or ex partner: Not on file    Emotionally abused: Not on file    Physically  abused: Not on file    Forced sexual activity: Not on file  Other Topics Concern  . Not on file  Social History Narrative  . Not on file      Review of Systems  All other systems reviewed and are negative.      Objective:   Physical Exam Vitals signs reviewed.  Constitutional:      General: He is not in acute distress.    Appearance: He is not ill-appearing or toxic-appearing.  Cardiovascular:     Rate and Rhythm: Normal rate and regular rhythm.     Pulses: Normal pulses.     Heart sounds: Normal heart sounds.  Pulmonary:     Effort: Pulmonary effort is normal. No respiratory distress.     Breath sounds: Normal breath sounds. No stridor. No wheezing, rhonchi or rales.  Chest:     Chest wall: No tenderness.  Abdominal:     General: Bowel sounds are normal. There is no distension.     Palpations: Abdomen is soft.     Tenderness: There is no abdominal tenderness. There is no guarding.  Musculoskeletal:     Right lower leg: No edema.     Left lower leg: No edema.  Neurological:     General: No focal deficit present.     Mental Status: He is alert and oriented to person, place, and time.     Cranial Nerves: No cranial nerve deficit.     Sensory: No sensory deficit.     Motor: No weakness.     Coordination: Coordination normal.     Gait: Gait normal.           Assessment & Plan:  Chronic cough - Plan: DG Chest 2 View  Patient has a chronic cough which sounds to be more of an irritant cough.  I have requested that he get a chest x-ray.  Meanwhile I would start the patient on Xyzal 5 mg p.o. daily for postnasal drip and drainage.  Of asked the patient to continue Flonase 2 sprays each nostril daily.  Have also asked him to continue to  refrain from using lisinopril as I believe this could have been playing a role in his cough as well and may need more time to exit his system and his symptoms have improved some since stopping it.  I will also maximize his therapy for acid reflux and potential laryngo-esophageal reflux by increasing Protonix to 40 mg twice a day and discontinue omeprazole.  Recheck in 2 weeks.

## 2019-08-16 ENCOUNTER — Other Ambulatory Visit: Payer: Self-pay | Admitting: Family Medicine

## 2019-08-17 ENCOUNTER — Other Ambulatory Visit: Payer: Self-pay | Admitting: Family Medicine

## 2019-09-20 ENCOUNTER — Encounter: Payer: Self-pay | Admitting: Family Medicine

## 2019-09-20 ENCOUNTER — Other Ambulatory Visit: Payer: Self-pay

## 2019-09-20 ENCOUNTER — Ambulatory Visit (INDEPENDENT_AMBULATORY_CARE_PROVIDER_SITE_OTHER): Payer: Medicare Other | Admitting: Family Medicine

## 2019-09-20 ENCOUNTER — Ambulatory Visit
Admission: RE | Admit: 2019-09-20 | Discharge: 2019-09-20 | Disposition: A | Discharge status: Home / Self Care | Payer: Medicare Other | Source: Ambulatory Visit | Attending: Family Medicine | Admitting: Family Medicine

## 2019-09-20 VITALS — BP 156/78 | HR 77 | Temp 97.3°F | Resp 18 | Ht 71.0 in | Wt 218.0 lb

## 2019-09-20 DIAGNOSIS — M7989 Other specified soft tissue disorders: Secondary | ICD-10-CM | POA: Diagnosis not present

## 2019-09-20 DIAGNOSIS — L03116 Cellulitis of left lower limb: Secondary | ICD-10-CM | POA: Diagnosis not present

## 2019-09-20 MED ORDER — SULFAMETHOXAZOLE-TRIMETHOPRIM 800-160 MG PO TABS: 1.0000 | ORAL_TABLET | Freq: Two times a day (BID) | ORAL | 0 refills | Status: DC

## 2019-09-20 NOTE — Progress Notes (Signed)
Subjective:    Patient ID: James Fletcher, male    DOB: 1946-09-06, 74 y.o.   MRN: [id number]  HPI    The recovery patient presents today with pain in his left knee.  Patient recently tripped at home and fell.  He landed on the plastic floor protector.  He has a Scientist, clinical (histocompatibility and immunogenetics) underneath his desk downstairs to protect the floor from a rolling desk chair.  This was flipped over with the plastic spikes sticking up.  When he tripped and landed directly on the plastic spikes he suffered several penetrations of the skin as shown above.  Immediately after the fall, the patient was able to stand with no pain.  He was bleeding from the penetrations but otherwise was doing well.  He had no pain in his leg other than soreness from where he fell.  He went to bed.  This occurred Friday.  He woke up Saturday and he immediately saw swelling in his left leg distal to the knee.  Over the next 2 days the swelling has worsened.  The skin over the left kneecap is now erythematous warm and tender to the touch.  He appears to have prepatellar bursitis.  He also has +1 pitting edema distal to the knee all the way down to the foot.  The skin is tight tense and swollen.  It is asymmetrically swollen compared to the right leg.  He also has pain with walking Past Medical History:  Diagnosis Date  . Cholecystitis   . Left bundle-branch block 02/26/2012  . Neuropathy 02/26/2012  . Peripheral neuropathy    Past Surgical History:  Procedure Laterality Date  . APPENDECTOMY    . CHOLECYSTECTOMY N/A 05/01/2014   Procedure: LAPAROSCOPIC CHOLECYSTECTOMY;  Surgeon: Zenovia Jarred, MD;  Location: [place];  Service: General;  Laterality: N/A;  . HERNIA REPAIR     Current Outpatient Medications on File Prior to Visit  Medication Sig Dispense Refill  . acetaminophen (TYLENOL) 325 MG tablet Take 650 mg by mouth every 6 (six) hours as needed.    Marland Kitchen aspirin 81 MG tablet Take 81 mg by mouth daily.    Marland Kitchen atorvastatin (LIPITOR)  80 MG tablet Take 80 mg by mouth daily.    . carbamazepine (TEGRETOL) 200 MG tablet Take 1 tablet (200 mg total) by mouth 2 (two) times daily. 60 tablet 1  . fluticasone (FLONASE) 50 MCG/ACT nasal spray Place 2 sprays into both nostrils daily. 16 g 6  . gabapentin (NEURONTIN) 400 MG capsule Take 1,200 mg by mouth 3 (three) times daily.     . hydrALAZINE (APRESOLINE) 50 MG tablet Take 50 mg by mouth 3 (three) times daily.    Marland Kitchen levocetirizine (XYZAL) 5 MG tablet TAKE 1 TABLET BY MOUTH ONCE DAILY IN THE EVENING 90 tablet 2  . losartan (COZAAR) 50 MG tablet Take 1 tablet (50 mg total) by mouth daily. 90 tablet 3  . Multiple Vitamins-Minerals (MULTIVITAMIN WITH MINERALS) tablet Take 1 tablet by mouth daily.    Marland Kitchen omeprazole (PRILOSEC) 20 MG capsule Take 20 mg by mouth daily. Patient uses this medication for acid reflux.    . pantoprazole (PROTONIX) 40 MG tablet Take 1 tablet (40 mg total) by mouth 2 (two) times daily. 30 tablet 3  . tamsulosin (FLOMAX) 0.4 MG CAPS capsule Take 0.4 mg by mouth.    Marland Kitchen VITAMIN D, CHOLECALCIFEROL, PO Take 1 tablet by mouth daily.     No current facility-administered medications on file prior  to visit.   No Known Allergies Social History   Socioeconomic History  . Marital status: Married    Spouse name: Not on file  . Number of children: Not on file  . Years of education: Not on file  . Highest education level: Not on file  Occupational History  . Not on file  Tobacco Use  . Smoking status: Current Every Day Smoker    Packs/day: 1.00    Years: 30.00    Pack years: 30.00    Types: Cigarettes  . Smokeless tobacco: Never Used  Substance and Sexual Activity  . Alcohol use: No  . Drug use: No  . Sexual activity: Not on file  Other Topics Concern  . Not on file  Social History Narrative  . Not on file   Social Determinants of Health   Financial Resource Strain:   . Difficulty of Paying Living Expenses: Not on file  Food Insecurity:   . Worried About  Charity fundraiser in the Last Year: Not on file  . Ran Out of Food in the Last Year: Not on file  Transportation Needs:   . Lack of Transportation (Medical): Not on file  . Lack of Transportation (Non-Medical): Not on file  Physical Activity:   . Days of Exercise per Week: Not on file  . Minutes of Exercise per Session: Not on file  Stress:   . Feeling of Stress : Not on file  Social Connections:   . Frequency of Communication with Friends and Family: Not on file  . Frequency of Social Gatherings with Friends and Family: Not on file  . Attends Religious Services: Not on file  . Active Member of Clubs or Organizations: Not on file  . Attends Archivist Meetings: Not on file  . Marital Status: Not on file  Intimate Partner Violence:   . Fear of Current or Ex-Partner: Not on file  . Emotionally Abused: Not on file  . Physically Abused: Not on file  . Sexually Abused: Not on file     Review of Systems  All other systems reviewed and are negative.      Objective:   Physical Exam Vitals reviewed.  Constitutional:      Appearance: Normal appearance.  Cardiovascular:     Rate and Rhythm: Normal rate and regular rhythm.     Heart sounds: Normal heart sounds.  Pulmonary:     Effort: Pulmonary effort is normal.     Breath sounds: Normal breath sounds.  Musculoskeletal:        General: Swelling and tenderness present.     Right lower leg: No edema.     Left lower leg: Edema present.  Skin:    General: Skin is warm.     Findings: Erythema present.  Neurological:     Mental Status: He is alert.           Assessment & Plan:  Leg swelling - Plan: US Venous Img Lower Unilateral Left, US Venous Img Lower Unilateral Left  Cellulitis of left leg  Patient has asymmetric swelling in his left leg.  I believe this could be secondary to the cellulitis he has developed.  Patient may be developing septic bursitis as well.  Begin the patient immediately on Bactrim double  strength tablets 1 tablet twice daily to cover for possible MRSA cellulitis.  Obtain a venous ultrasound of the left leg to rule out a DVT.  Reassess the patient in 48 hours or sooner if  worsening.  Obviously if there is a DVT he will need to start anticoagulation.  If there is no DVT, I would suspect that the swelling is due to the infection and should improve with the antibiotics.

## 2020-04-13 ENCOUNTER — Ambulatory Visit (INDEPENDENT_AMBULATORY_CARE_PROVIDER_SITE_OTHER): Payer: Medicare Other | Admitting: Family Medicine

## 2020-04-13 ENCOUNTER — Other Ambulatory Visit: Payer: Self-pay

## 2020-04-13 VITALS — BP 140/80 | HR 64 | Temp 98.4°F | Ht 71.0 in | Wt 223.0 lb

## 2020-04-13 DIAGNOSIS — J441 Chronic obstructive pulmonary disease with (acute) exacerbation: Secondary | ICD-10-CM

## 2020-04-13 MED ORDER — DOXYCYCLINE HYCLATE 100 MG PO TABS: 100.0000 mg | ORAL_TABLET | Freq: Two times a day (BID) | ORAL | 0 refills | Status: DC

## 2020-04-13 MED ORDER — PREDNISONE 20 MG PO TABS
ORAL_TABLET | ORAL | 0 refills | Status: DC
Start: 2020-04-13 — End: 2020-04-24

## 2020-04-13 NOTE — Progress Notes (Signed)
Subjective:    Patient ID: James Fletcher, male    DOB: December 26, 1945, 74 y.o.   MRN: [id number]  HPI  06/02/19 Patient received the majority of his health care at the [place].  He presents today complaining of "allergies".  However he reports a constant cough that is been present for several months.  He also reports coughing spells that leave him extremely short of breath and panic.  The cough is occasionally productive of yellow and white sputum.  He denies any pleurisy.  He denies any hemoptysis.  He states that he fell several weeks ago and went to the emergency room at the [facility] where a CT scan revealed fractured ribs on his left side and "a touch of pneumonia".  He was not given any antibiotics for the pneumonia.  He smokes a half a pack of cigarettes a day.  He states that he has been evaluated at the [facility] for possible COPD and was told that he does not have COPD.  The cough is more of a chronic tickle cough.  It is more of an irritant and an itch in the back of his throat.  He also reports postnasal drip and drainage and mucus that seems to become thick and dry and that he has a difficult time being able to bring up no matter how hard he coughs.  The cough is similar throughout the day and night.  He denies any acid reflux.  The cough does not get worse with supine.  He denies any fevers or chills or night sweats or weight loss.  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  He denies any reflux.  However he does report postnasal drip and drainage down his throat.  He stopped his antihistamines because they seem to "dry it out and make it thicker and harder to call follow.  Therefore I will try the patient on Flonase 2 sprays each nostril daily.  I also believe that the lisinopril is likely playing a role.  Therefore of asked the patient to discontinue lisinopril and replace with losartan 50 mg a day.  He can use Tessalon Perls 200 mg p.o. every 8 hours as needed  cough.  Reassess in 2 weeks.  Of also requested the patient get a chest x-ray and if there is in fact pneumonia seen on chest x-ray I would recommend antibiotic treatment for this as well.  If the cough is no better, consider laryngo-esophageal reflux and starting a daily PPI.  Also consider PFTs at that point  06/21/19 Patient states that the cough has improved since he quit lisinopril however he continues to have coughing spells.  These primarily occur at night.  Patient states that he will feel strangled due to postnasal drainage.  This will trigger a coughing spell that causes him to sit up.  He believes anxiety could be contributing to some of the dyspnea.  He constantly feels like he has to clear his throat.  His wife states that he is coughing also during the day however the coughing spells are more frequent at night.  He denies any hemoptysis.  He denies any fever or chills or chest pain.  He states that his dyspnea has improved.  He denies any reflux.  However he does report constant postnasal drip and postnasal drainage.  He believes this is what is triggering the cough.  He never went for the chest x-ray as requested last time.  At that  time, my plan was: Patient has a chronic cough which sounds to be more of an irritant cough.  I have requested that he get a chest x-ray.  Meanwhile I would start the patient on Xyzal 5 mg p.o. daily for postnasal drip and drainage.  Of asked the patient to continue Flonase 2 sprays each nostril daily.  Have also asked him to continue to refrain from using lisinopril as I believe this could have been playing a role in his cough as well and may need more time to exit his system and his symptoms have improved some since stopping it.  I will also maximize his therapy for acid reflux and potential laryngo-esophageal reflux by increasing Protonix to 40 mg twice a day and discontinue omeprazole.  Recheck in 2 weeks.  04/13/20 Cough ultimately went away.  However he states  that earlier this week he developed a cough.  He reports chest congestion.  He is unable to bring up anything with the cough.  He reports some wheezing.  He denies any pleurisy or hemoptysis.  He denies any fevers or chills.  He has been taking Mucinex but he is still been unable to break up the congestion that he feels in his airways.  Patient has a longstanding history of smoking although he is never been evaluated for COPD.  He denies any sinus pain or pressure.  He denies any rhinorrhea.  He had Covid in January although he has not been vaccinated Past Medical History:  Diagnosis Date  . Cholecystitis   . Left bundle-branch block 02/26/2012  . Neuropathy 02/26/2012  . Peripheral neuropathy    Past Surgical History:  Procedure Laterality Date  . APPENDECTOMY    . CHOLECYSTECTOMY N/A 05/01/2014   Procedure: LAPAROSCOPIC CHOLECYSTECTOMY;  Surgeon: Zenovia Jarred, MD;  Location: [facility];  Service: General;  Laterality: N/A;  . HERNIA REPAIR     Current Outpatient Medications on File Prior to Visit  Medication Sig Dispense Refill  . acetaminophen (TYLENOL) 325 MG tablet Take 650 mg by mouth every 6 (six) hours as needed.    Marland Kitchen aspirin 81 MG tablet Take 81 mg by mouth daily.    Marland Kitchen atorvastatin (LIPITOR) 80 MG tablet Take 80 mg by mouth daily.    . carbamazepine (TEGRETOL) 200 MG tablet Take 1 tablet (200 mg total) by mouth 2 (two) times daily. 60 tablet 1  . fluticasone (FLONASE) 50 MCG/ACT nasal spray Place 2 sprays into both nostrils daily. 16 g 6  . gabapentin (NEURONTIN) 400 MG capsule Take 1,200 mg by mouth 3 (three) times daily.     . hydrALAZINE (APRESOLINE) 50 MG tablet Take 50 mg by mouth 3 (three) times daily.    Marland Kitchen levocetirizine (XYZAL) 5 MG tablet TAKE 1 TABLET BY MOUTH ONCE DAILY IN THE EVENING 90 tablet 2  . losartan (COZAAR) 50 MG tablet Take 1 tablet (50 mg total) by mouth daily. 90 tablet 3  . Multiple Vitamins-Minerals (MULTIVITAMIN WITH MINERALS) tablet Take 1 tablet by mouth  daily.    Marland Kitchen omeprazole (PRILOSEC) 20 MG capsule Take 20 mg by mouth daily. Patient uses this medication for acid reflux.    . pantoprazole (PROTONIX) 40 MG tablet Take 1 tablet (40 mg total) by mouth 2 (two) times daily. 30 tablet 3  . sulfamethoxazole-trimethoprim (BACTRIM DS) 800-160 MG tablet Take 1 tablet by mouth 2 (two) times daily. 14 tablet 0  . tamsulosin (FLOMAX) 0.4 MG CAPS capsule Take 0.4 mg by mouth.    Marland Kitchen  VITAMIN D, CHOLECALCIFEROL, PO Take 1 tablet by mouth daily.     No current facility-administered medications on file prior to visit.   No Known Allergies Social History   Socioeconomic History  . Marital status: Married    Spouse name: Not on file  . Number of children: Not on file  . Years of education: Not on file  . Highest education level: Not on file  Occupational History  . Not on file  Tobacco Use  . Smoking status: Current Every Day Smoker    Packs/day: 1.00    Years: 30.00    Pack years: 30.00    Types: Cigarettes  . Smokeless tobacco: Never Used  Substance and Sexual Activity  . Alcohol use: No  . Drug use: No  . Sexual activity: Not on file  Other Topics Concern  . Not on file  Social History Narrative  . Not on file   Social Determinants of Health   Financial Resource Strain:   . Difficulty of Paying Living Expenses:   Food Insecurity:   . Worried About Charity fundraiser in the Last Year:   . Arboriculturist in the Last Year:   Transportation Needs:   . Film/video editor (Medical):   Marland Kitchen Lack of Transportation (Non-Medical):   Physical Activity:   . Days of Exercise per Week:   . Minutes of Exercise per Session:   Stress:   . Feeling of Stress :   Social Connections:   . Frequency of Communication with Friends and Family:   . Frequency of Social Gatherings with Friends and Family:   . Attends Religious Services:   . Active Member of Clubs or Organizations:   . Attends Archivist Meetings:   Marland Kitchen Marital Status:     Intimate Partner Violence:   . Fear of Current or Ex-Partner:   . Emotionally Abused:   Marland Kitchen Physically Abused:   . Sexually Abused:       Review of Systems  All other systems reviewed and are negative.      Objective:   Physical Exam Vitals reviewed.  Constitutional:      General: He is not in acute distress.    Appearance: He is not ill-appearing or toxic-appearing.  Cardiovascular:     Rate and Rhythm: Normal rate and regular rhythm.     Pulses: Normal pulses.     Heart sounds: Normal heart sounds.  Pulmonary:     Effort: Pulmonary effort is normal. No respiratory distress.     Breath sounds: Decreased air movement present. No stridor. Decreased breath sounds and wheezing present. No rhonchi or rales.  Chest:     Chest wall: No tenderness.  Abdominal:     General: Bowel sounds are normal. There is no distension.     Palpations: Abdomen is soft.     Tenderness: There is no abdominal tenderness. There is no guarding.  Musculoskeletal:     Right lower leg: No edema.     Left lower leg: No edema.  Neurological:     General: No focal deficit present.     Mental Status: He is alert and oriented to person, place, and time.     Cranial Nerves: No cranial nerve deficit.     Sensory: No sensory deficit.     Motor: No weakness.     Coordination: Coordination normal.     Gait: Gait normal.           Assessment & Plan:  I believe the patient may be having a COPD exacerbation/bronchitis.  Begin a prednisone taper pack in addition to doxycycline 100 mg twice daily for 7 days and reassess next week.  Seek medical attention immediately if he develops worsening shortness of breath or chest pain

## 2020-04-24 ENCOUNTER — Ambulatory Visit (INDEPENDENT_AMBULATORY_CARE_PROVIDER_SITE_OTHER): Payer: Medicare Other | Admitting: Family Medicine

## 2020-04-24 ENCOUNTER — Other Ambulatory Visit: Payer: Self-pay

## 2020-04-24 VITALS — BP 142/80 | HR 73 | Temp 96.7°F | Ht 71.0 in | Wt 226.0 lb

## 2020-04-24 DIAGNOSIS — R0600 Dyspnea, unspecified: Secondary | ICD-10-CM | POA: Diagnosis not present

## 2020-04-24 DIAGNOSIS — R05 Cough: Secondary | ICD-10-CM

## 2020-04-24 DIAGNOSIS — J441 Chronic obstructive pulmonary disease with (acute) exacerbation: Secondary | ICD-10-CM

## 2020-04-24 DIAGNOSIS — R053 Chronic cough: Secondary | ICD-10-CM

## 2020-04-24 MED ORDER — PANTOPRAZOLE SODIUM 40 MG PO TBEC: 40.0000 mg | DELAYED_RELEASE_TABLET | Freq: Every day | ORAL | 3 refills | Status: DC

## 2020-04-24 MED ORDER — TRELEGY ELLIPTA 100-62.5-25 MCG/INH IN AEPB: 1.0000 | INHALATION_SPRAY | Freq: Every day | RESPIRATORY_TRACT | 3 refills | Status: DC

## 2020-04-24 NOTE — Progress Notes (Signed)
Subjective:    Patient ID: James Fletcher, male    DOB: 09/05/1946, 74 y.o.   MRN: [id number]  HPI  06/02/19 Patient received the majority of his health care at the [place].  He presents today complaining of "allergies".  However he reports a constant cough that is been present for several months.  He also reports coughing spells that leave him extremely short of breath and panic.  The cough is occasionally productive of yellow and white sputum.  He denies any pleurisy.  He denies any hemoptysis.  He states that he fell several weeks ago and went to the emergency room at the [facility] where a CT scan revealed fractured ribs on his left side and "a touch of pneumonia".  He was not given any antibiotics for the pneumonia.  He smokes a half a pack of cigarettes a day.  He states that he has been evaluated at the [facility] for possible COPD and was told that he does not have COPD.  The cough is more of a chronic tickle cough.  It is more of an irritant and an itch in the back of his throat.  He also reports postnasal drip and drainage and mucus that seems to become thick and dry and that he has a difficult time being able to bring up no matter how hard he coughs.  The cough is similar throughout the day and night.  He denies any acid reflux.  The cough does not get worse with supine.  He denies any fevers or chills or night sweats or weight loss.  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  He denies any reflux.  However he does report postnasal drip and drainage down his throat.  He stopped his antihistamines because they seem to "dry it out and make it thicker and harder to call follow.  Therefore I will try the patient on Flonase 2 sprays each nostril daily.  I also believe that the lisinopril is likely playing a role.  Therefore of asked the patient to discontinue lisinopril and replace with losartan 50 mg a day.  He can use Tessalon Perls 200 mg p.o. every 8 hours as needed  cough.  Reassess in 2 weeks.  Of also requested the patient get a chest x-ray and if there is in fact pneumonia seen on chest x-ray I would recommend antibiotic treatment for this as well.  If the cough is no better, consider laryngo-esophageal reflux and starting a daily PPI.  Also consider PFTs at that point  06/21/19 Patient states that the cough has improved since he quit lisinopril however he continues to have coughing spells.  These primarily occur at night.  Patient states that he will feel strangled due to postnasal drainage.  This will trigger a coughing spell that causes him to sit up.  He believes anxiety could be contributing to some of the dyspnea.  He constantly feels like he has to clear his throat.  His wife states that he is coughing also during the day however the coughing spells are more frequent at night.  He denies any hemoptysis.  He denies any fever or chills or chest pain.  He states that his dyspnea has improved.  He denies any reflux.  However he does report constant postnasal drip and postnasal drainage.  He believes this is what is triggering the cough.  He never went for the chest x-ray as requested last time.  At that  time, my plan was: Patient has a chronic cough which sounds to be more of an irritant cough.  I have requested that he get a chest x-ray.  Meanwhile I would start the patient on Xyzal 5 mg p.o. daily for postnasal drip and drainage.  Of asked the patient to continue Flonase 2 sprays each nostril daily.  Have also asked him to continue to refrain from using lisinopril as I believe this could have been playing a role in his cough as well and may need more time to exit his system and his symptoms have improved some since stopping it.  I will also maximize his therapy for acid reflux and potential laryngo-esophageal reflux by increasing Protonix to 40 mg twice a day and discontinue omeprazole.  Recheck in 2 weeks.  04/13/20 Cough ultimately went away.  However he states  that earlier this week he developed a cough.  He reports chest congestion.  He is unable to bring up anything with the cough.  He reports some wheezing.  He denies any pleurisy or hemoptysis.  He denies any fevers or chills.  He has been taking Mucinex but he is still been unable to break up the congestion that he feels in his airways.  Patient has a longstanding history of smoking although he is never been evaluated for COPD.  He denies any sinus pain or pressure.  He denies any rhinorrhea.  He had Covid in January although he has not been vaccinated.  AT that time, my plan was: I believe the patient may be having a COPD exacerbation/bronchitis.  Begin a prednisone taper pack in addition to doxycycline 100 mg twice daily for 7 days and reassess next week.  Seek medical attention immediately if he develops worsening shortness of breath or chest pain.  04/24/20 Shortly after I saw the patient, he became so choked coughing that he became short of breath and he called EMS.  He went to the [facility] emergency room over the weekend.  There he was started on Asmanex in addition to guaifenesin and was given an albuterol inhaler.  Patient states that he is not seen any improvement with his cough.  He continues to have thick chest congestion.  He states that he feels like there is phlegm and mucus on his vocal cords that he is unable to bring up at all no matter how hard he coughs.  Sometimes he gets in such a coughing fit that he will start to feel like he is choking and he will panic.  Therefore anxiety is likely playing a role.  He also reports feeling short of breath at times.  Today on examination, he has diminished breath sounds bilaterally however left side is more pronounced than right side.  He also has faint expiratory wheezing.  He continues to smoke approximately 1 pack of cigarettes per day.  He has never been officially evaluated for emphysema however I suspect that he has emphysema.  He denies any hemoptysis or  fever.  He states that he had a chest x-ray at the emergency room that was clear and showed no pneumonia.  He has completed the antibiotics and steroids however he saw no significant improvement.  His biggest issue is now hoarseness from all the coughing as well as a mucus-like sensation in his vocal cords that he feels like he is constantly choking on.  He also has a dry nonproductive cough the majority of the day.  Medication list states that he is on omeprazole as well  as pantoprazole however the patient states that he is not taking the medication.  He seldom takes anything for acid reflux because he seldom has acid reflux Past Medical History:  Diagnosis Date  . Cholecystitis   . Left bundle-branch block 02/26/2012  . Neuropathy 02/26/2012  . Peripheral neuropathy    Past Surgical History:  Procedure Laterality Date  . APPENDECTOMY    . CHOLECYSTECTOMY N/A 05/01/2014   Procedure: LAPAROSCOPIC CHOLECYSTECTOMY;  Surgeon: Zenovia Jarred, MD;  Location: [place];  Service: General;  Laterality: N/A;  . HERNIA REPAIR     Current Outpatient Medications on File Prior to Visit  Medication Sig Dispense Refill  . acetaminophen (TYLENOL) 325 MG tablet Take 650 mg by mouth every 6 (six) hours as needed.    Marland Kitchen aspirin 81 MG tablet Take 81 mg by mouth daily.    Marland Kitchen atorvastatin (LIPITOR) 80 MG tablet Take 80 mg by mouth daily.    . carbamazepine (TEGRETOL) 200 MG tablet Take 1 tablet (200 mg total) by mouth 2 (two) times daily. 60 tablet 1  . doxycycline (VIBRA-TABS) 100 MG tablet Take 1 tablet (100 mg total) by mouth 2 (two) times daily. 14 tablet 0  . fluticasone (FLONASE) 50 MCG/ACT nasal spray Place 2 sprays into both nostrils daily. 16 g 6  . gabapentin (NEURONTIN) 400 MG capsule Take 1,200 mg by mouth 3 (three) times daily.     . hydrALAZINE (APRESOLINE) 50 MG tablet Take 50 mg by mouth 3 (three) times daily.    Marland Kitchen levocetirizine (XYZAL) 5 MG tablet TAKE 1 TABLET BY MOUTH ONCE DAILY IN THE EVENING 90  tablet 2  . losartan (COZAAR) 50 MG tablet Take 1 tablet (50 mg total) by mouth daily. 90 tablet 3  . Multiple Vitamins-Minerals (MULTIVITAMIN WITH MINERALS) tablet Take 1 tablet by mouth daily.    Marland Kitchen omeprazole (PRILOSEC) 20 MG capsule Take 20 mg by mouth daily. Patient uses this medication for acid reflux.    . pantoprazole (PROTONIX) 40 MG tablet Take 1 tablet (40 mg total) by mouth 2 (two) times daily. 30 tablet 3  . predniSONE (DELTASONE) 20 MG tablet 3 tabs poqday 1-2, 2 tabs poqday 3-4, 1 tab poqday 5-6 12 tablet 0  . sulfamethoxazole-trimethoprim (BACTRIM DS) 800-160 MG tablet Take 1 tablet by mouth 2 (two) times daily. 14 tablet 0  . tamsulosin (FLOMAX) 0.4 MG CAPS capsule Take 0.4 mg by mouth.    Marland Kitchen VITAMIN D, CHOLECALCIFEROL, PO Take 1 tablet by mouth daily.     No current facility-administered medications on file prior to visit.   No Known Allergies Social History   Socioeconomic History  . Marital status: Married    Spouse name: Not on file  . Number of children: Not on file  . Years of education: Not on file  . Highest education level: Not on file  Occupational History  . Not on file  Tobacco Use  . Smoking status: Current Every Day Smoker    Packs/day: 1.00    Years: 30.00    Pack years: 30.00    Types: Cigarettes  . Smokeless tobacco: Never Used  Substance and Sexual Activity  . Alcohol use: No  . Drug use: No  . Sexual activity: Not on file  Other Topics Concern  . Not on file  Social History Narrative  . Not on file   Social Determinants of Health   Financial Resource Strain:   . Difficulty of Paying Living Expenses:   Food Insecurity:   .  Worried About Charity fundraiser in the Last Year:   . Arboriculturist in the Last Year:   Transportation Needs:   . Film/video editor (Medical):   Marland Kitchen Lack of Transportation (Non-Medical):   Physical Activity:   . Days of Exercise per Week:   . Minutes of Exercise per Session:   Stress:   . Feeling of Stress  :   Social Connections:   . Frequency of Communication with Friends and Family:   . Frequency of Social Gatherings with Friends and Family:   . Attends Religious Services:   . Active Member of Clubs or Organizations:   . Attends Archivist Meetings:   Marland Kitchen Marital Status:   Intimate Partner Violence:   . Fear of Current or Ex-Partner:   . Emotionally Abused:   Marland Kitchen Physically Abused:   . Sexually Abused:       Review of Systems  All other systems reviewed and are negative.      Objective:   Physical Exam Vitals reviewed.  Constitutional:      General: He is not in acute distress.    Appearance: He is not ill-appearing or toxic-appearing.  Cardiovascular:     Rate and Rhythm: Normal rate and regular rhythm.     Pulses: Normal pulses.     Heart sounds: Normal heart sounds.  Pulmonary:     Effort: Pulmonary effort is normal. No respiratory distress.     Breath sounds: Decreased air movement present. No stridor. Decreased breath sounds and wheezing present. No rhonchi or rales.  Chest:     Chest wall: No tenderness.  Abdominal:     General: Bowel sounds are normal. There is no distension.     Palpations: Abdomen is soft.     Tenderness: There is no abdominal tenderness. There is no guarding.  Musculoskeletal:     Right lower leg: No edema.     Left lower leg: No edema.  Neurological:     General: No focal deficit present.     Mental Status: He is alert and oriented to person, place, and time.     Cranial Nerves: No cranial nerve deficit.     Sensory: No sensory deficit.     Motor: No weakness.     Coordination: Coordination normal.     Gait: Gait normal.           Assessment & Plan:  Chronic cough  Dyspnea, unspecified type  COPD exacerbation ([place])  I believe the patient likely has multifactorial chronic cough secondary to underlying emphysema coupled with laryngoesophageal reflux.  Therefore I recommended that he start Protonix 40 mg a day for  possible laryngoesophageal reflux.  I would like him to discontinue the Asmanex provided to him at the emergency room and I will start the patient on Trelegy 1 inhalation a day.  I hope that the anticholinergic medication will help "dry" some of the mucus and help with bronchospasm as well.  He can continue to use albuterol 2 puffs inhaled every 6 hours as needed.  Recommended discontinuation of smoking.  He can continue to use guaifenesin as needed.  Recheck next week or sooner if worsening

## 2020-06-14 ENCOUNTER — Other Ambulatory Visit: Payer: Self-pay

## 2020-06-14 ENCOUNTER — Ambulatory Visit
Admission: RE | Admit: 2020-06-14 | Discharge: 2020-06-14 | Disposition: A | Discharge status: Home / Self Care | Payer: Medicare Other | Source: Ambulatory Visit | Attending: Family Medicine | Admitting: Family Medicine

## 2020-06-14 ENCOUNTER — Encounter: Payer: Self-pay | Admitting: Family Medicine

## 2020-06-14 ENCOUNTER — Ambulatory Visit (INDEPENDENT_AMBULATORY_CARE_PROVIDER_SITE_OTHER): Payer: Medicare Other | Admitting: Family Medicine

## 2020-06-14 VITALS — BP 144/70 | HR 68 | Temp 97.9°F | Resp 14 | Ht 71.0 in | Wt 222.0 lb

## 2020-06-14 DIAGNOSIS — R053 Chronic cough: Secondary | ICD-10-CM

## 2020-06-14 DIAGNOSIS — J411 Mucopurulent chronic bronchitis: Secondary | ICD-10-CM | POA: Diagnosis not present

## 2020-06-14 DIAGNOSIS — R059 Cough, unspecified: Secondary | ICD-10-CM | POA: Diagnosis not present

## 2020-06-14 MED ORDER — BUDESONIDE-FORMOTEROL FUMARATE 160-4.5 MCG/ACT IN AERO
2.0000 | INHALATION_SPRAY | Freq: Two times a day (BID) | RESPIRATORY_TRACT | 3 refills | Status: DC
Start: 2020-06-14 — End: 2021-04-01

## 2020-06-14 NOTE — Progress Notes (Signed)
Subjective:    Patient ID: James Fletcher, male    DOB: December 26, 1945, 74 y.o.   MRN: [id number]  HPI  06/02/19 Patient received the majority of his health care at the [place].  He presents today complaining of "allergies".  However he reports a constant cough that is been present for several months.  He also reports coughing spells that leave him extremely short of breath and panic.  The cough is occasionally productive of yellow and white sputum.  He denies any pleurisy.  He denies any hemoptysis.  He states that he fell several weeks ago and went to the emergency room at the [facility] where a CT scan revealed fractured ribs on his left side and "a touch of pneumonia".  He was not given any antibiotics for the pneumonia.  He smokes a half a pack of cigarettes a day.  He states that he has been evaluated at the [facility] for possible COPD and was told that he does not have COPD.  The cough is more of a chronic tickle cough.  It is more of an irritant and an itch in the back of his throat.  He also reports postnasal drip and drainage and mucus that seems to become thick and dry and that he has a difficult time being able to bring up no matter how hard he coughs.  The cough is similar throughout the day and night.  He denies any acid reflux.  The cough does not get worse with supine.  He denies any fevers or chills or night sweats or weight loss.  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  He denies any reflux.  However he does report postnasal drip and drainage down his throat.  He stopped his antihistamines because they seem to "dry it out and make it thicker and harder to call follow.  Therefore I will try the patient on Flonase 2 sprays each nostril daily.  I also believe that the lisinopril is likely playing a role.  Therefore of asked the patient to discontinue lisinopril and replace with losartan 50 mg a day.  He can use Tessalon Perls 200 mg p.o. every 8 hours as needed  cough.  Reassess in 2 weeks.  Of also requested the patient get a chest x-ray and if there is in fact pneumonia seen on chest x-ray I would recommend antibiotic treatment for this as well.  If the cough is no better, consider laryngo-esophageal reflux and starting a daily PPI.  Also consider PFTs at that point  06/21/19 Patient states that the cough has improved since he quit lisinopril however he continues to have coughing spells.  These primarily occur at night.  Patient states that he will feel strangled due to postnasal drainage.  This will trigger a coughing spell that causes him to sit up.  He believes anxiety could be contributing to some of the dyspnea.  He constantly feels like he has to clear his throat.  His wife states that he is coughing also during the day however the coughing spells are more frequent at night.  He denies any hemoptysis.  He denies any fever or chills or chest pain.  He states that his dyspnea has improved.  He denies any reflux.  However he does report constant postnasal drip and postnasal drainage.  He believes this is what is triggering the cough.  He never went for the chest x-ray as requested last time.  At that  time, my plan was: Patient has a chronic cough which sounds to be more of an irritant cough.  I have requested that he get a chest x-ray.  Meanwhile I would start the patient on Xyzal 5 mg p.o. daily for postnasal drip and drainage.  Of asked the patient to continue Flonase 2 sprays each nostril daily.  Have also asked him to continue to refrain from using lisinopril as I believe this could have been playing a role in his cough as well and may need more time to exit his system and his symptoms have improved some since stopping it.  I will also maximize his therapy for acid reflux and potential laryngo-esophageal reflux by increasing Protonix to 40 mg twice a day and discontinue omeprazole.  Recheck in 2 weeks.  04/13/20 Cough ultimately went away.  However he states  that earlier this week he developed a cough.  He reports chest congestion.  He is unable to bring up anything with the cough.  He reports some wheezing.  He denies any pleurisy or hemoptysis.  He denies any fevers or chills.  He has been taking Mucinex but he is still been unable to break up the congestion that he feels in his airways.  Patient has a longstanding history of smoking although he is never been evaluated for COPD.  He denies any sinus pain or pressure.  He denies any rhinorrhea.  He had Covid in January although he has not been vaccinated.  AT that time, my plan was: I believe the patient may be having a COPD exacerbation/bronchitis.  Begin a prednisone taper pack in addition to doxycycline 100 mg twice daily for 7 days and reassess next week.  Seek medical attention immediately if he develops worsening shortness of breath or chest pain.  04/24/20 Shortly after I saw the patient, he became so choked coughing that he became short of breath and he called EMS.  He went to the [facility] emergency room over the weekend.  There he was started on Asmanex in addition to guaifenesin and was given an albuterol inhaler.  Patient states that he is not seen any improvement with his cough.  He continues to have thick chest congestion.  He states that he feels like there is phlegm and mucus on his vocal cords that he is unable to bring up at all no matter how hard he coughs.  Sometimes he gets in such a coughing fit that he will start to feel like he is choking and he will panic.  Therefore anxiety is likely playing a role.  He also reports feeling short of breath at times.  Today on examination, he has diminished breath sounds bilaterally however left side is more pronounced than right side.  He also has faint expiratory wheezing.  He continues to smoke approximately 1 pack of cigarettes per day.  He has never been officially evaluated for emphysema however I suspect that he has emphysema.  He denies any hemoptysis or  fever.  He states that he had a chest x-ray at the emergency room that was clear and showed no pneumonia.  He has completed the antibiotics and steroids however he saw no significant improvement.  His biggest issue is now hoarseness from all the coughing as well as a mucus-like sensation in his vocal cords that he feels like he is constantly choking on.  He also has a dry nonproductive cough the majority of the day.  Medication list states that he is on omeprazole as well  as pantoprazole however the patient states that he is not taking the medication.  He seldom takes anything for acid reflux because he seldom has acid reflux.  At that time, my plan was: I believe the patient likely has multifactorial chronic cough secondary to underlying emphysema coupled with laryngoesophageal reflux.  Therefore I recommended that he start Protonix 40 mg a day for possible laryngoesophageal reflux.  I would like him to discontinue the Asmanex provided to him at the emergency room and I will start the patient on Trelegy 1 inhalation a day.  I hope that the anticholinergic medication will help "dry" some of the mucus and help with bronchospasm as well.  He can continue to use albuterol 2 puffs inhaled every 6 hours as needed.  Recommended discontinuation of smoking.  He can continue to use guaifenesin as needed.  Recheck next week or sooner if worsening  06/14/20 Patient was unable to afford the Trelegy.  However he called the [place] and they started him on Spiriva.  He does think the Spiriva is helping some however he states that when he wakes up in the morning.  He has to cough frequently to help break up mucus that has accumulated in his chest during the night.  He will cough up thick white mucus.  After that he will continue to bring up yellow mucus throughout the day.  He states that it is amazing how much mucus he will cough up throughout the day.  He denies any shortness of breath.  He denies any panic attacks.  He is not  taking Mucinex.  He is taking Protonix.  He states the cough is no better and is just as frequent.  He really wants something to help decrease the mucus production from his lungs. Past Medical History:  Diagnosis Date  . Cholecystitis   . Left bundle-branch block 02/26/2012  . Neuropathy 02/26/2012  . Peripheral neuropathy    Past Surgical History:  Procedure Laterality Date  . APPENDECTOMY    . CHOLECYSTECTOMY N/A 05/01/2014   Procedure: LAPAROSCOPIC CHOLECYSTECTOMY;  Surgeon: Zenovia Jarred, MD;  Location: [place];  Service: General;  Laterality: N/A;  . HERNIA REPAIR     Current Outpatient Medications on File Prior to Visit  Medication Sig Dispense Refill  . acetaminophen (TYLENOL) 325 MG tablet Take 650 mg by mouth every 6 (six) hours as needed.    Marland Kitchen aspirin 81 MG tablet Take 81 mg by mouth daily.    Marland Kitchen atorvastatin (LIPITOR) 80 MG tablet Take 80 mg by mouth daily.    . carbamazepine (TEGRETOL) 200 MG tablet Take 1 tablet (200 mg total) by mouth 2 (two) times daily. 60 tablet 1  . doxycycline (VIBRA-TABS) 100 MG tablet Take 1 tablet (100 mg total) by mouth 2 (two) times daily. 14 tablet 0  . fluticasone (FLONASE) 50 MCG/ACT nasal spray Place 2 sprays into both nostrils daily. 16 g 6  . Fluticasone-Umeclidin-Vilant (TRELEGY ELLIPTA) 100-62.5-25 MCG/INH AEPB Inhale 1 Inhaler into the lungs daily. 1 each 3  . gabapentin (NEURONTIN) 400 MG capsule Take 1,200 mg by mouth 3 (three) times daily.     . hydrALAZINE (APRESOLINE) 50 MG tablet Take 50 mg by mouth 3 (three) times daily.    Marland Kitchen levocetirizine (XYZAL) 5 MG tablet TAKE 1 TABLET BY MOUTH ONCE DAILY IN THE EVENING 90 tablet 2  . losartan (COZAAR) 50 MG tablet Take 1 tablet (50 mg total) by mouth daily. 90 tablet 3  . Multiple Vitamins-Minerals (MULTIVITAMIN  WITH MINERALS) tablet Take 1 tablet by mouth daily.    Marland Kitchen omeprazole (PRILOSEC) 20 MG capsule Take 20 mg by mouth daily. Patient uses this medication for acid reflux.    . pantoprazole  (PROTONIX) 40 MG tablet Take 1 tablet (40 mg total) by mouth daily. 30 tablet 3  . sulfamethoxazole-trimethoprim (BACTRIM DS) 800-160 MG tablet Take 1 tablet by mouth 2 (two) times daily. 14 tablet 0  . tamsulosin (FLOMAX) 0.4 MG CAPS capsule Take 0.4 mg by mouth.    Marland Kitchen VITAMIN D, CHOLECALCIFEROL, PO Take 1 tablet by mouth daily.     No current facility-administered medications on file prior to visit.   No Known Allergies Social History   Socioeconomic History  . Marital status: Married    Spouse name: Not on file  . Number of children: Not on file  . Years of education: Not on file  . Highest education level: Not on file  Occupational History  . Not on file  Tobacco Use  . Smoking status: Current Every Day Smoker    Packs/day: 1.00    Years: 30.00    Pack years: 30.00    Types: Cigarettes  . Smokeless tobacco: Never Used  Substance and Sexual Activity  . Alcohol use: No  . Drug use: No  . Sexual activity: Not on file  Other Topics Concern  . Not on file  Social History Narrative  . Not on file   Social Determinants of Health   Financial Resource Strain:   . Difficulty of Paying Living Expenses: Not on file  Food Insecurity:   . Worried About Charity fundraiser in the Last Year: Not on file  . Ran Out of Food in the Last Year: Not on file  Transportation Needs:   . Lack of Transportation (Medical): Not on file  . Lack of Transportation (Non-Medical): Not on file  Physical Activity:   . Days of Exercise per Week: Not on file  . Minutes of Exercise per Session: Not on file  Stress:   . Feeling of Stress : Not on file  Social Connections:   . Frequency of Communication with Friends and Family: Not on file  . Frequency of Social Gatherings with Friends and Family: Not on file  . Attends Religious Services: Not on file  . Active Member of Clubs or Organizations: Not on file  . Attends Archivist Meetings: Not on file  . Marital Status: Not on file   Intimate Partner Violence:   . Fear of Current or Ex-Partner: Not on file  . Emotionally Abused: Not on file  . Physically Abused: Not on file  . Sexually Abused: Not on file      Review of Systems  All other systems reviewed and are negative.      Objective:   Physical Exam Vitals reviewed.  Constitutional:      General: He is not in acute distress.    Appearance: He is not ill-appearing or toxic-appearing.  Cardiovascular:     Rate and Rhythm: Normal rate and regular rhythm.     Pulses: Normal pulses.     Heart sounds: Normal heart sounds.  Pulmonary:     Effort: Pulmonary effort is normal. No respiratory distress.     Breath sounds: Decreased air movement present. No stridor. Decreased breath sounds and rales present. No wheezing or rhonchi.  Chest:     Chest wall: No tenderness.  Abdominal:     General: Bowel sounds are normal.  There is no distension.     Palpations: Abdomen is soft.     Tenderness: There is no abdominal tenderness. There is no guarding.  Musculoskeletal:     Right lower leg: No edema.     Left lower leg: No edema.  Neurological:     General: No focal deficit present.     Mental Status: He is alert and oriented to person, place, and time.     Cranial Nerves: No cranial nerve deficit.     Sensory: No sensory deficit.     Motor: No weakness.     Coordination: Coordination normal.     Gait: Gait normal.           Assessment & Plan:  Chronic cough - Plan: DG Chest 2 View  Mucopurulent chronic bronchitis (HCC)  Recommended adding Symbicort 160/4.52 puffs inhaled twice daily to the Spiriva.  Recommended getting extended release Mucinex and taking it twice daily scheduled to decrease the production and thickness of the mucus.  Obtain chest x-ray to evaluate the source of the left basilar crackles.  If no pneumonia or infiltrate or opacity is seen, I will recommend adding a flutter valve to assist the patient in breaking up the mucus and  improving lung function.  Continue to encourage smoking cessation.

## 2020-06-14 NOTE — Addendum Note (Signed)
Addended by: Sheral Flow on: 06/14/2020 03:29 PM   Modules accepted: Orders

## 2020-06-14 NOTE — Addendum Note (Signed)
Addended by: Sheral Flow on: 06/14/2020 02:59 PM   Modules accepted: Orders

## 2020-08-15 ENCOUNTER — Other Ambulatory Visit: Payer: Self-pay | Admitting: Family Medicine

## 2020-11-28 ENCOUNTER — Ambulatory Visit (INDEPENDENT_AMBULATORY_CARE_PROVIDER_SITE_OTHER): Payer: Medicare Other | Admitting: Family Medicine

## 2020-11-28 ENCOUNTER — Encounter: Payer: Self-pay | Admitting: Family Medicine

## 2020-11-28 ENCOUNTER — Other Ambulatory Visit: Payer: Self-pay

## 2020-11-28 VITALS — BP 146/72 | HR 72 | Temp 97.6°F | Resp 16 | Ht 71.0 in | Wt 223.0 lb

## 2020-11-28 DIAGNOSIS — L723 Sebaceous cyst: Secondary | ICD-10-CM

## 2020-11-28 NOTE — Progress Notes (Signed)
Subjective:    Patient ID: James Fletcher, male    DOB: Feb 03, 1946, 75 y.o.   MRN: [id number]  HPI  Patient presents today with an enlarging mass on his chest.  It is just lateral to the xiphoid process and just medial to his left nipple.  It is roughly 2 cm in diameter.  It is soft round and nodular.  It is spongy in consistency.  I believe is most likely a sebaceous cyst.  There is no erythema or evidence of infection but it does seem to be growing.  There does appear to be a central pore. Past Medical History:  Diagnosis Date  . Cholecystitis   . Left bundle-branch block 02/26/2012  . Neuropathy 02/26/2012  . Peripheral neuropathy    Past Surgical History:  Procedure Laterality Date  . APPENDECTOMY    . CHOLECYSTECTOMY N/A 05/01/2014   Procedure: LAPAROSCOPIC CHOLECYSTECTOMY;  Surgeon: Zenovia Jarred, MD;  Location: [place];  Service: General;  Laterality: N/A;  . HERNIA REPAIR     Current Outpatient Medications on File Prior to Visit  Medication Sig Dispense Refill  . acetaminophen (TYLENOL) 325 MG tablet Take 650 mg by mouth every 6 (six) hours as needed.    Marland Kitchen aspirin 81 MG tablet Take 81 mg by mouth daily.    Marland Kitchen atorvastatin (LIPITOR) 80 MG tablet Take 80 mg by mouth daily.    . budesonide-formoterol (SYMBICORT) 160-4.5 MCG/ACT inhaler Inhale 2 puffs into the lungs 2 (two) times daily. 1 each 3  . carbamazepine (TEGRETOL) 200 MG tablet Take 1 tablet (200 mg total) by mouth 2 (two) times daily. 60 tablet 1  . fluticasone (FLONASE) 50 MCG/ACT nasal spray Place 2 sprays into both nostrils daily. 16 g 6  . Fluticasone-Umeclidin-Vilant (TRELEGY ELLIPTA) 100-62.5-25 MCG/INH AEPB Inhale 1 Inhaler into the lungs daily. (Patient not taking: Reported on 06/14/2020) 1 each 3  . gabapentin (NEURONTIN) 400 MG capsule Take 1,200 mg by mouth 3 (three) times daily.     . hydrALAZINE (APRESOLINE) 50 MG tablet Take 50 mg by mouth 3 (three) times daily.    Marland Kitchen levocetirizine (XYZAL) 5 MG tablet TAKE 1  TABLET BY MOUTH ONCE DAILY IN THE EVENING 90 tablet 2  . losartan (COZAAR) 50 MG tablet Take 1 tablet by mouth once daily 90 tablet 0  . Multiple Vitamins-Minerals (MULTIVITAMIN WITH MINERALS) tablet Take 1 tablet by mouth daily.    . pantoprazole (PROTONIX) 40 MG tablet Take 1 tablet (40 mg total) by mouth daily. 30 tablet 3  . tamsulosin (FLOMAX) 0.4 MG CAPS capsule Take 0.4 mg by mouth.    . Tiotropium Bromide Monohydrate (SPIRIVA RESPIMAT) 2.5 MCG/ACT AERS Inhale into the lungs.    Marland Kitchen VITAMIN D, CHOLECALCIFEROL, PO Take 1 tablet by mouth daily.    Marland Kitchen zinc gluconate 50 MG tablet Take 50 mg by mouth daily.     No current facility-administered medications on file prior to visit.   No Known Allergies Social History   Socioeconomic History  . Marital status: Married    Spouse name: Not on file  . Number of children: Not on file  . Years of education: Not on file  . Highest education level: Not on file  Occupational History  . Not on file  Tobacco Use  . Smoking status: Current Every Day Smoker    Packs/day: 1.00    Years: 30.00    Pack years: 30.00    Types: Cigarettes  . Smokeless tobacco: Never Used  Substance and Sexual Activity  . Alcohol use: No  . Drug use: No  . Sexual activity: Not on file  Other Topics Concern  . Not on file  Social History Narrative  . Not on file   Social Determinants of Health   Financial Resource Strain: Not on file  Food Insecurity: Not on file  Transportation Needs: Not on file  Physical Activity: Not on file  Stress: Not on file  Social Connections: Not on file  Intimate Partner Violence: Not on file      Review of Systems  All other systems reviewed and are negative.      Objective:   Physical Exam Vitals reviewed.  Constitutional:      General: He is not in acute distress.    Appearance: He is not ill-appearing or toxic-appearing.  Cardiovascular:     Rate and Rhythm: Normal rate and regular rhythm.     Pulses: Normal  pulses.     Heart sounds: Normal heart sounds.  Pulmonary:     Effort: Pulmonary effort is normal. No respiratory distress.     Breath sounds: Decreased air movement present. No stridor. Decreased breath sounds present. No wheezing, rhonchi or rales.  Chest:     Chest wall: No tenderness.    Neurological:     Mental Status: He is alert.           Assessment & Plan:  Sebaceous cyst  Patient has a 2.5 cm sebaceous cyst on his anterior chest.  We discussed the risk and benefits and he would like to proceed with surgical excision.  I offered the patient referral to a general surgeon but he would like to come to the office and have it done here if possible.  We will schedule a time that is convenient for him to have an elective surgical excision of the sebaceous cyst

## 2020-12-03 ENCOUNTER — Other Ambulatory Visit: Payer: Self-pay | Admitting: Family Medicine

## 2020-12-05 ENCOUNTER — Other Ambulatory Visit: Payer: Self-pay | Admitting: Family Medicine

## 2020-12-06 ENCOUNTER — Encounter: Payer: Self-pay | Admitting: Family Medicine

## 2020-12-06 ENCOUNTER — Ambulatory Visit (INDEPENDENT_AMBULATORY_CARE_PROVIDER_SITE_OTHER): Payer: Medicare Other | Admitting: Family Medicine

## 2020-12-06 ENCOUNTER — Other Ambulatory Visit: Payer: Self-pay

## 2020-12-06 VITALS — BP 154/82 | HR 88 | Temp 98.1°F | Resp 16 | Ht 71.0 in | Wt 222.0 lb

## 2020-12-06 DIAGNOSIS — L723 Sebaceous cyst: Secondary | ICD-10-CM | POA: Diagnosis not present

## 2020-12-06 MED ORDER — VALSARTAN 160 MG PO TABS: 160.0000 mg | ORAL_TABLET | Freq: Every day | ORAL | 3 refills | Status: DC

## 2020-12-06 NOTE — Progress Notes (Signed)
Subjective:    Patient ID: James Fletcher, male    DOB: 1946/04/11, 75 y.o.   MRN: [id number]  HPI  Patient presents today with an enlarging mass on his chest.  It is just lateral to the xiphoid process and just medial to his left nipple.  It is roughly 3 cm in diameter.  It is soft round and nodular.  It is spongy in consistency.  I believe is most likely a sebaceous cyst.  There is no erythema or evidence of infection but it does seem to be growing.  There does appear to be a central pore.  I saw this at his last visit.  He presents today for excision. Past Medical History:  Diagnosis Date  . Cholecystitis   . Left bundle-branch block 02/26/2012  . Neuropathy 02/26/2012  . Peripheral neuropathy    Past Surgical History:  Procedure Laterality Date  . APPENDECTOMY    . CHOLECYSTECTOMY N/A 05/01/2014   Procedure: LAPAROSCOPIC CHOLECYSTECTOMY;  Surgeon: Zenovia Jarred, MD;  Location: [place];  Service: General;  Laterality: N/A;  . HERNIA REPAIR     Current Outpatient Medications on File Prior to Visit  Medication Sig Dispense Refill  . acetaminophen (TYLENOL) 325 MG tablet Take 650 mg by mouth every 6 (six) hours as needed.    Marland Kitchen aspirin 81 MG tablet Take 81 mg by mouth daily.    Marland Kitchen atorvastatin (LIPITOR) 80 MG tablet Take 80 mg by mouth daily.    . budesonide-formoterol (SYMBICORT) 160-4.5 MCG/ACT inhaler Inhale 2 puffs into the lungs 2 (two) times daily. (Patient not taking: Reported on 11/28/2020) 1 each 3  . carbamazepine (TEGRETOL) 200 MG tablet Take 1 tablet (200 mg total) by mouth 2 (two) times daily. 60 tablet 1  . fluticasone (FLONASE) 50 MCG/ACT nasal spray Place 2 sprays into both nostrils daily. 16 g 6  . gabapentin (NEURONTIN) 400 MG capsule Take 1,200 mg by mouth 3 (three) times daily.     . hydrALAZINE (APRESOLINE) 50 MG tablet Take 50 mg by mouth 3 (three) times daily.    Marland Kitchen levocetirizine (XYZAL) 5 MG tablet TAKE 1 TABLET BY MOUTH ONCE DAILY IN THE EVENING 90 tablet 2  .  losartan (COZAAR) 50 MG tablet Take 1 tablet by mouth once daily 90 tablet 0  . Multiple Vitamins-Minerals (MULTIVITAMIN WITH MINERALS) tablet Take 1 tablet by mouth daily.    . pantoprazole (PROTONIX) 40 MG tablet Take 1 tablet (40 mg total) by mouth daily. 30 tablet 3  . tamsulosin (FLOMAX) 0.4 MG CAPS capsule Take 0.4 mg by mouth.    . Tiotropium Bromide Monohydrate (SPIRIVA RESPIMAT) 2.5 MCG/ACT AERS Inhale into the lungs.    Marland Kitchen VITAMIN D, CHOLECALCIFEROL, PO Take 1 tablet by mouth daily.    Marland Kitchen zinc gluconate 50 MG tablet Take 50 mg by mouth daily.     No current facility-administered medications on file prior to visit.   No Known Allergies Social History   Socioeconomic History  . Marital status: Married    Spouse name: Not on file  . Number of children: Not on file  . Years of education: Not on file  . Highest education level: Not on file  Occupational History  . Not on file  Tobacco Use  . Smoking status: Current Every Day Smoker    Packs/day: 1.00    Years: 30.00    Pack years: 30.00    Types: Cigarettes  . Smokeless tobacco: Never Used  Substance and Sexual  Activity  . Alcohol use: No  . Drug use: No  . Sexual activity: Not on file  Other Topics Concern  . Not on file  Social History Narrative  . Not on file   Social Determinants of Health   Financial Resource Strain: Not on file  Food Insecurity: Not on file  Transportation Needs: Not on file  Physical Activity: Not on file  Stress: Not on file  Social Connections: Not on file  Intimate Partner Violence: Not on file      Review of Systems  All other systems reviewed and are negative.      Objective:   Physical Exam Vitals reviewed.  Constitutional:      General: He is not in acute distress.    Appearance: He is not ill-appearing or toxic-appearing.  Cardiovascular:     Rate and Rhythm: Normal rate and regular rhythm.     Pulses: Normal pulses.     Heart sounds: Normal heart sounds.  Pulmonary:      Effort: Pulmonary effort is normal. No respiratory distress.     Breath sounds: Decreased air movement present. No stridor. Decreased breath sounds present. No wheezing, rhonchi or rales.  Chest:     Chest wall: No tenderness.    Neurological:     Mental Status: He is alert.           Assessment & Plan:  Sebaceous cyst  Lesion was anesthetized with 0.1% lidocaine with epinephrine.  The area was prepped and draped in sterile fashion.  I made a 4 cm x 3 cm elliptical excision down to the subcutaneous fat all the way around the lesion.  It was then removed in its entirety with the cyst sac intact.  The wound bed was then irrigated thoroughly with sterile saline.  The skin edges were then approximated with 5 simple interrupted 3-0 Ethilon sutures.  Patient tolerated the procedure well with minimal blood loss.  Stitches out in 10 days.

## 2020-12-17 ENCOUNTER — Ambulatory Visit (INDEPENDENT_AMBULATORY_CARE_PROVIDER_SITE_OTHER): Payer: Medicare Other | Admitting: Family Medicine

## 2020-12-17 ENCOUNTER — Other Ambulatory Visit: Payer: Self-pay

## 2020-12-17 ENCOUNTER — Encounter: Payer: Self-pay | Admitting: Family Medicine

## 2020-12-17 VITALS — BP 148/78 | HR 72 | Temp 98.6°F | Resp 18 | Ht 71.0 in | Wt 222.0 lb

## 2020-12-17 DIAGNOSIS — Z4802 Encounter for removal of sutures: Secondary | ICD-10-CM

## 2020-12-17 NOTE — Progress Notes (Signed)
Subjective:    Patient ID: James Fletcher, male    DOB: Jun 19, 1946, 75 y.o.   MRN: [id number]  HPI  12/06/20 Patient presents today with an enlarging mass on his chest.  It is just lateral to the xiphoid process and just medial to his left nipple.  It is roughly 3 cm in diameter.  It is soft round and nodular.  It is spongy in consistency.  I believe is most likely a sebaceous cyst.  There is no erythema or evidence of infection but it does seem to be growing.  There does appear to be a central pore.  I saw this at his last visit.  He presents today for excision.  At that time, my plan was:  Lesion was anesthetized with 0.1% lidocaine with epinephrine.  The area was prepped and draped in sterile fashion.  I made a 4 cm x 3 cm elliptical excision down to the subcutaneous fat all the way around the lesion.  It was then removed in its entirety with the cyst sac intact.  The wound bed was then irrigated thoroughly with sterile saline.  The skin edges were then approximated with 5 simple interrupted 3-0 Ethilon sutures.  Patient tolerated the procedure well with minimal blood loss.  Stitches out in 10 days.  12/17/20 Here today for suture removal.  There is no evidence of erythema, warmth, or fluctuance to suggest cellulitis or an abscess formation. Past Medical History:  Diagnosis Date  . Cholecystitis   . Left bundle-branch block 02/26/2012  . Neuropathy 02/26/2012  . Peripheral neuropathy    Past Surgical History:  Procedure Laterality Date  . APPENDECTOMY    . CHOLECYSTECTOMY N/A 05/01/2014   Procedure: LAPAROSCOPIC CHOLECYSTECTOMY;  Surgeon: Zenovia Jarred, MD;  Location: [place];  Service: General;  Laterality: N/A;  . HERNIA REPAIR     Current Outpatient Medications on File Prior to Visit  Medication Sig Dispense Refill  . acetaminophen (TYLENOL) 325 MG tablet Take 650 mg by mouth every 6 (six) hours as needed.    Marland Kitchen aspirin 81 MG tablet Take 81 mg by mouth daily.    Marland Kitchen atorvastatin  (LIPITOR) 80 MG tablet Take 80 mg by mouth daily.    . budesonide-formoterol (SYMBICORT) 160-4.5 MCG/ACT inhaler Inhale 2 puffs into the lungs 2 (two) times daily. 1 each 3  . carbamazepine (TEGRETOL) 200 MG tablet Take 1 tablet (200 mg total) by mouth 2 (two) times daily. 60 tablet 1  . fluticasone (FLONASE) 50 MCG/ACT nasal spray Place 2 sprays into both nostrils daily. 16 g 6  . gabapentin (NEURONTIN) 400 MG capsule Take 1,200 mg by mouth 3 (three) times daily.     . hydrALAZINE (APRESOLINE) 50 MG tablet Take 50 mg by mouth 3 (three) times daily.    Marland Kitchen levocetirizine (XYZAL) 5 MG tablet TAKE 1 TABLET BY MOUTH ONCE DAILY IN THE EVENING 90 tablet 2  . Multiple Vitamins-Minerals (MULTIVITAMIN WITH MINERALS) tablet Take 1 tablet by mouth daily.    . pantoprazole (PROTONIX) 40 MG tablet Take 1 tablet (40 mg total) by mouth daily. 30 tablet 3  . tamsulosin (FLOMAX) 0.4 MG CAPS capsule Take 0.4 mg by mouth.    . Tiotropium Bromide Monohydrate (SPIRIVA RESPIMAT) 2.5 MCG/ACT AERS Inhale into the lungs.    . valsartan (DIOVAN) 160 MG tablet Take 1 tablet (160 mg total) by mouth daily. This replaces losartan 90 tablet 3  . VITAMIN D, CHOLECALCIFEROL, PO Take 1 tablet by mouth daily.    Marland Kitchen  zinc gluconate 50 MG tablet Take 50 mg by mouth daily.     No current facility-administered medications on file prior to visit.   No Known Allergies Social History   Socioeconomic History  . Marital status: Married    Spouse name: Not on file  . Number of children: Not on file  . Years of education: Not on file  . Highest education level: Not on file  Occupational History  . Not on file  Tobacco Use  . Smoking status: Current Every Day Smoker    Packs/day: 1.00    Years: 30.00    Pack years: 30.00    Types: Cigarettes  . Smokeless tobacco: Never Used  Substance and Sexual Activity  . Alcohol use: No  . Drug use: No  . Sexual activity: Not on file  Other Topics Concern  . Not on file  Social History  Narrative  . Not on file   Social Determinants of Health   Financial Resource Strain: Not on file  Food Insecurity: Not on file  Transportation Needs: Not on file  Physical Activity: Not on file  Stress: Not on file  Social Connections: Not on file  Intimate Partner Violence: Not on file      Review of Systems  All other systems reviewed and are negative.      Objective:   Physical Exam Vitals reviewed.  Constitutional:      General: He is not in acute distress.    Appearance: He is not ill-appearing or toxic-appearing.  Cardiovascular:     Rate and Rhythm: Normal rate and regular rhythm.     Pulses: Normal pulses.     Heart sounds: Normal heart sounds.  Pulmonary:     Effort: Pulmonary effort is normal. No respiratory distress.     Breath sounds: Decreased air movement present. No stridor. Decreased breath sounds present. No wheezing, rhonchi or rales.  Chest:     Chest wall: No tenderness.    Neurological:     Mental Status: He is alert.           Assessment & Plan:  Visit for suture removal 5 sutures removed without difficulty.  Wound was reinforced with Steri-Strips.  Wound care was discussed.

## 2021-04-01 ENCOUNTER — Encounter: Payer: Self-pay | Admitting: Nurse Practitioner

## 2021-04-01 ENCOUNTER — Telehealth (INDEPENDENT_AMBULATORY_CARE_PROVIDER_SITE_OTHER): Payer: Medicare Other | Admitting: Nurse Practitioner

## 2021-04-01 ENCOUNTER — Other Ambulatory Visit: Payer: Self-pay

## 2021-04-01 DIAGNOSIS — J069 Acute upper respiratory infection, unspecified: Secondary | ICD-10-CM

## 2021-04-01 MED ORDER — SALINE SPRAY 0.65 % NA SOLN: 1.0000 | NASAL | 0 refills | Status: DC | PRN

## 2021-04-01 MED ORDER — AMOXICILLIN-POT CLAVULANATE 875-125 MG PO TABS: 1.0000 | ORAL_TABLET | Freq: Two times a day (BID) | ORAL | 0 refills | Status: AC

## 2021-04-01 NOTE — Progress Notes (Signed)
Subjective:    Patient ID: James Fletcher, male    DOB: 08/13/1946, 75 y.o.   MRN: [id number]  HPI: James Fletcher is a 75 y.o. male presenting virtually for cough.   Chief Complaint  Patient presents with   Illness    Since Saturday, pt not feeling well, having lots of phlegm that can't come up with cough. Taking robitussin. Tried mucinex, make him feel worse.    UPPER RESPIRATORY TRACT INFECTION Onset: 1 week ago COVID-19 testing history: not tested COVID-19 vaccination status:  Fever: no Cough: yes; thick and sticky Shortness of breath: no Wheezing: no Chest pain: no Chest tightness: no Chest congestion: yes Nasal congestion: yes Runny nose: yes Post nasal drip: yes Sneezing: no Sore throat: no Swollen glands: no Sinus pressure:  yes; above eyebrows Headache: no Face pain: no Toothache: no Ear pain: no  Ear pressure: no  Eyes red/itching:no Eye drainage/crusting: no  Nausea: no  Vomiting: no Diarrhea: no  Change in appetite: no  Loss of taste/smell: no  Rash: no Fatigue: yes Sick contacts: no Strep contacts: no  Context: stable Recurrent sinusitis: no Treatments attempted: Robitussin, Mucinex Relief with OTC medications: no Water intake: 1 bottle daily  Of note, patient is a war veteran and was stationed for some time in [place].  He also has a history of cigarette smoking, but states "I do not inhale the cigarette smoke - I make sure of it."  No Known Allergies  Outpatient Encounter Medications as of 04/01/2021  Medication Sig Note   acetaminophen (TYLENOL) 325 MG tablet Take 650 mg by mouth every 6 (six) hours as needed.    amoxicillin-clavulanate (AUGMENTIN) 875-125 MG tablet Take 1 tablet by mouth 2 (two) times daily for 7 days.    aspirin 81 MG tablet Take 81 mg by mouth daily.    atorvastatin (LIPITOR) 80 MG tablet Take 80 mg by mouth daily.    gabapentin (NEURONTIN) 400 MG capsule Take 1,200 mg by mouth 3 (three) times daily.      hydrALAZINE (APRESOLINE) 50 MG tablet Take 50 mg by mouth 3 (three) times daily. 06/02/2019: Pt unaware of mg's - gets meds at [facility]   Multiple Vitamins-Minerals (MULTIVITAMIN WITH MINERALS) tablet Take 1 tablet by mouth daily.    pantoprazole (PROTONIX) 40 MG tablet Take 1 tablet (40 mg total) by mouth daily.    sodium chloride (OCEAN) 0.65 % SOLN nasal spray Place 1 spray into both nostrils as needed for congestion.    tamsulosin (FLOMAX) 0.4 MG CAPS capsule Take 0.4 mg by mouth.    valsartan (DIOVAN) 160 MG tablet Take 1 tablet (160 mg total) by mouth daily. This replaces losartan    VITAMIN D, CHOLECALCIFEROL, PO Take 1 tablet by mouth daily.    zinc gluconate 50 MG tablet Take 50 mg by mouth daily.    [DISCONTINUED] budesonide-formoterol (SYMBICORT) 160-4.5 MCG/ACT inhaler Inhale 2 puffs into the lungs 2 (two) times daily.    [DISCONTINUED] carbamazepine (TEGRETOL) 200 MG tablet Take 1 tablet (200 mg total) by mouth 2 (two) times daily.    [DISCONTINUED] fluticasone (FLONASE) 50 MCG/ACT nasal spray Place 2 sprays into both nostrils daily.    [DISCONTINUED] levocetirizine (XYZAL) 5 MG tablet TAKE 1 TABLET BY MOUTH ONCE DAILY IN THE EVENING    [DISCONTINUED] Tiotropium Bromide Monohydrate (SPIRIVA RESPIMAT) 2.5 MCG/ACT AERS Inhale into the lungs.    No facility-administered encounter medications on file as of 04/01/2021.    Patient Active Problem List  Diagnosis Date Noted   Cholecystitis with cholelithiasis 05/01/2014   Hypokalemia 02/27/2012   Abdominal pain 02/27/2012   Fever 02/27/2012   Leukopenia with low lymphocyte count 02/27/2012   Anemia due to acute illness  02/27/2012   Rhabdomyolysis 02/27/2012   SIRS (systemic inflammatory response syndrome) ([place]) 02/26/2012   Acute respiratory failure with hypoxia ([place]) 02/26/2012   Community acquired pneumonia 02/26/2012   Dehydration with hyponatremia 02/26/2012   Abnormal transaminases 02/26/2012   Peripheral neuropathy- unspecified  02/26/2012   Left bundle-branch block 02/26/2012   Thrombocytopenia ([place]) 02/26/2012    Past Medical History:  Diagnosis Date   Cholecystitis    Left bundle-branch block 02/26/2012   Neuropathy 02/26/2012   Peripheral neuropathy     Relevant past medical, surgical, family and social history reviewed and updated as indicated. Interim medical history since our last visit reviewed.  Review of Systems Per HPI unless specifically indicated above     Objective:    There were no vitals taken for this visit.  Wt Readings from Last 3 Encounters:  12/17/20 222 lb (100.7 kg)  12/06/20 222 lb (100.7 kg)  11/28/20 223 lb (101.2 kg)    Physical Exam Physical examination unable to be performed due to lack of equipment.  Patient talking in complete sentences during telemedicine visit.     Assessment & Plan:  1. Upper respiratory tract infection, unspecified type Acute, ongoing.  Given length of symptoms, cannot rule out potential bacterial cause.  Encouraged COVID-19 testing, although patient declines today.  Also with smoking history and possible underlying lung disease.  Will start saline nasal rinses, increase hydration, and Mucinex twice daily with plenty of water.  With no improvement near end of week, can start Augmentin for 10 days.  Return to clinic if symptoms do not improve with this treatment.  With any sudden onset new chest pain, dizziness, sweating, or shortness of breath, go to ED.    - sodium chloride (OCEAN) 0.65 % SOLN nasal spray; Place 1 spray into both nostrils as needed for congestion.  Dispense: 88 mL; Refill: 0 - SARS-CoV-2 RNA (COVID-19) and Respiratory Viral Panel, Qualitative [facility] - amoxicillin-clavulanate (AUGMENTIN) 875-125 MG tablet; Take 1 tablet by mouth 2 (two) times daily for 7 days.  Dispense: 10 tablet; Refill: 0    Follow up plan: Return if symptoms worsen or fail to improve.  This visit was completed via telephone due to the restrictions of the  COVID-19 pandemic. All issues as above were discussed and addressed but no physical exam was performed. If it was felt that the patient should be evaluated in the office, they were directed there. The patient verbally consented to this visit. Patient was unable to complete an audio/visual visit due to Lack of equipment. Location of the patient: home Location of the provider: work Those involved with this call:  Provider: Noemi Chapel, DNP, FNP-C CMA: Annabelle Harman, CMA Front Desk/Registration: Santina Evans  Time spent on call:  18 minutes on the phone discussing health concerns. 20 minutes total spent in review of patient's record and preparation of their chart. I verified patient identity using two factors (patient name and date of birth). Patient consents verbally to being seen via telemedicine visit today.

## 2021-06-27 ENCOUNTER — Ambulatory Visit (INDEPENDENT_AMBULATORY_CARE_PROVIDER_SITE_OTHER): Payer: Medicare Other | Admitting: Family Medicine

## 2021-06-27 ENCOUNTER — Ambulatory Visit
Admission: RE | Admit: 2021-06-27 | Discharge: 2021-06-27 | Disposition: A | Discharge status: Home / Self Care | Payer: Medicare Other | Source: Ambulatory Visit | Attending: Family Medicine | Admitting: Family Medicine

## 2021-06-27 ENCOUNTER — Other Ambulatory Visit: Payer: Self-pay

## 2021-06-27 VITALS — BP 138/74 | HR 82 | Temp 98.4°F | Resp 16 | Ht 71.0 in | Wt 222.0 lb

## 2021-06-27 DIAGNOSIS — M79674 Pain in right toe(s): Secondary | ICD-10-CM

## 2021-06-27 DIAGNOSIS — L03031 Cellulitis of right toe: Secondary | ICD-10-CM | POA: Diagnosis not present

## 2021-06-27 DIAGNOSIS — M7989 Other specified soft tissue disorders: Secondary | ICD-10-CM | POA: Diagnosis not present

## 2021-06-27 MED ORDER — SULFAMETHOXAZOLE-TRIMETHOPRIM 800-160 MG PO TABS: 1.0000 | ORAL_TABLET | Freq: Two times a day (BID) | ORAL | 0 refills | Status: DC

## 2021-06-27 MED ORDER — CEFTRIAXONE SODIUM 500 MG IJ SOLR
500.0000 mg | Freq: Once | INTRAMUSCULAR | Status: AC
  Administered 2021-06-27: 500 mg via INTRAMUSCULAR

## 2021-06-27 NOTE — Addendum Note (Signed)
Addended by: Sheral Flow on: 06/27/2021 01:07 PM   Modules accepted: Orders

## 2021-06-27 NOTE — Progress Notes (Signed)
Subjective:    Patient ID: James Fletcher, male    DOB: 09/08/1946, 75 y.o.   MRN: [id number]  HPI  The right great toe distal to the MTP joint is erythematous warm and swollen and very tender to the touch.  This is in a patient with peripheral neuropathy.  He states that the symptoms began 1 week ago.  He rolled out of bed by accident and jammed his left toe against the floor.  He is certain that he broke his toe per his report.  His wife states that it immediately turned red after that and that the redness is actually better than what it was earlier.  However the swelling and redness is quite impressive.  I do not feel that this is simply bruising due to a broken toe.  He denies any signs of systemic illness.  There is a vesicle on the dorsum of the right great toe over the IP joint.  This does not appear to be a pustule but rather a vesicle that is roughly 1 cm in diameter.  He is unable to wiggle or move the toe due to pain. Past Medical History:  Diagnosis Date   Cholecystitis    Left bundle-branch block 02/26/2012   Neuropathy 02/26/2012   Peripheral neuropathy    Past Surgical History:  Procedure Laterality Date   APPENDECTOMY     CHOLECYSTECTOMY N/A 05/01/2014   Procedure: LAPAROSCOPIC CHOLECYSTECTOMY;  Surgeon: Zenovia Jarred, MD;  Location: [place];  Service: General;  Laterality: N/A;   HERNIA REPAIR     Current Outpatient Medications on File Prior to Visit  Medication Sig Dispense Refill   acetaminophen (TYLENOL) 325 MG tablet Take 650 mg by mouth every 6 (six) hours as needed.     aspirin 81 MG tablet Take 81 mg by mouth daily.     atorvastatin (LIPITOR) 80 MG tablet Take 80 mg by mouth daily.     gabapentin (NEURONTIN) 400 MG capsule Take 1,200 mg by mouth 3 (three) times daily.      hydrALAZINE (APRESOLINE) 50 MG tablet Take 50 mg by mouth 3 (three) times daily.     Multiple Vitamins-Minerals (MULTIVITAMIN WITH MINERALS) tablet Take 1 tablet by mouth daily.      pantoprazole (PROTONIX) 40 MG tablet Take 1 tablet (40 mg total) by mouth daily. 30 tablet 3   sodium chloride (OCEAN) 0.65 % SOLN nasal spray Place 1 spray into both nostrils as needed for congestion. 88 mL 0   tamsulosin (FLOMAX) 0.4 MG CAPS capsule Take 0.4 mg by mouth.     valsartan (DIOVAN) 160 MG tablet Take 1 tablet (160 mg total) by mouth daily. This replaces losartan 90 tablet 3   VITAMIN D, CHOLECALCIFEROL, PO Take 1 tablet by mouth daily.     zinc gluconate 50 MG tablet Take 50 mg by mouth daily.     No current facility-administered medications on file prior to visit.   No Known Allergies Social History   Socioeconomic History   Marital status: Married    Spouse name: Not on file   Number of children: Not on file   Years of education: Not on file   Highest education level: Not on file  Occupational History   Not on file  Tobacco Use   Smoking status: Every Day    Packs/day: 1.00    Years: 30.00    Pack years: 30.00    Types: Cigarettes   Smokeless tobacco: Never  Substance and Sexual  Activity   Alcohol use: No   Drug use: No   Sexual activity: Not on file  Other Topics Concern   Not on file  Social History Narrative   Not on file   Social Determinants of Health   Financial Resource Strain: Not on file  Food Insecurity: Not on file  Transportation Needs: Not on file  Physical Activity: Not on file  Stress: Not on file  Social Connections: Not on file  Intimate Partner Violence: Not on file     Review of Systems  All other systems reviewed and are negative.     Objective:   Physical Exam Vitals reviewed.  Constitutional:      Appearance: Normal appearance.  Cardiovascular:     Rate and Rhythm: Normal rate and regular rhythm.     Heart sounds: Normal heart sounds.  Pulmonary:     Effort: Pulmonary effort is normal.     Breath sounds: Normal breath sounds.  Musculoskeletal:        General: Swelling, tenderness and deformity present.     Left  lower leg: No edema.       Feet:  Skin:    General: Skin is warm.     Findings: Erythema present.  Neurological:     Mental Status: He is alert.          Assessment & Plan:  Toe pain, right - Plan: DG Foot Complete Right  Cellulitis of great toe of right foot Obtain an x-ray of the toe to evaluate for any fracture however I am most concerned by the cellulitis.  Begin Bactrim double strength tablets twice daily for 7 days to cover for possible MRSA.  Meanwhile gave the patient 500 mg Rocephin IM x1 now in the office.  If the redness is spreading over the weekend I want him to go immediately to the emergency room.

## 2021-06-27 NOTE — Addendum Note (Signed)
Addended by: Sheral Flow on: 06/27/2021 01:35 PM   Modules accepted: Orders

## 2021-07-03 ENCOUNTER — Telehealth: Payer: Self-pay | Admitting: Family Medicine

## 2021-07-03 NOTE — Telephone Encounter (Signed)
Left message for patient to call back and schedule Medicare Annual Wellness Visit (AWV) in office.  ° °If not able to come in office, please offer to do virtually or by telephone.  Left office number and my jabber #[phone]. ° °Due for AWVI ° °Please schedule at anytime with Nurse Health Advisor. °  °

## 2021-09-06 ENCOUNTER — Ambulatory Visit (INDEPENDENT_AMBULATORY_CARE_PROVIDER_SITE_OTHER): Payer: Medicare Other | Admitting: Family Medicine

## 2021-09-06 ENCOUNTER — Other Ambulatory Visit: Payer: Self-pay

## 2021-09-06 ENCOUNTER — Encounter: Payer: Self-pay | Admitting: Family Medicine

## 2021-09-06 VITALS — BP 146/82 | HR 82 | Temp 97.7°F | Resp 16 | Ht 71.0 in | Wt 216.6 lb

## 2021-09-06 DIAGNOSIS — J441 Chronic obstructive pulmonary disease with (acute) exacerbation: Secondary | ICD-10-CM | POA: Diagnosis not present

## 2021-09-06 MED ORDER — DOXYCYCLINE HYCLATE 100 MG PO TABS: 100.0000 mg | ORAL_TABLET | Freq: Two times a day (BID) | ORAL | 0 refills | Status: DC

## 2021-09-06 MED ORDER — PREDNISONE 20 MG PO TABS: ORAL_TABLET | ORAL | 0 refills | Status: DC

## 2021-09-06 NOTE — Progress Notes (Signed)
Subjective:    Patient ID: James Fletcher, male    DOB: 19-Oct-1945, 75 y.o.   MRN: [id number]  HPI  Please see my office visit from 2021.  Patient has never been formally evaluated for COPD.  However I believe clinically he has signs and symptoms of this.  Recently he developed an upper respiratory infection.  This was about 10 days ago.  He now reports a cough productive of green and yellow mucus.  He reports wheezing and chest congestion.  He also reports sinus pressure and sinus pain.  On examination today he has diminished breath sounds in all 4 lung fields and expiratory wheezing present in all 4 lung fields. Past Medical History:  Diagnosis Date   Cholecystitis    Left bundle-branch block 02/26/2012   Neuropathy 02/26/2012   Peripheral neuropathy    Past Surgical History:  Procedure Laterality Date   APPENDECTOMY     CHOLECYSTECTOMY N/A 05/01/2014   Procedure: LAPAROSCOPIC CHOLECYSTECTOMY;  Surgeon: Zenovia Jarred, MD;  Location: [place];  Service: General;  Laterality: N/A;   HERNIA REPAIR     Current Outpatient Medications on File Prior to Visit  Medication Sig Dispense Refill   acetaminophen (TYLENOL) 325 MG tablet Take 650 mg by mouth every 6 (six) hours as needed.     aspirin 81 MG tablet Take 81 mg by mouth daily.     atorvastatin (LIPITOR) 80 MG tablet Take 80 mg by mouth daily.     gabapentin (NEURONTIN) 400 MG capsule Take 1,200 mg by mouth 3 (three) times daily.      hydrALAZINE (APRESOLINE) 50 MG tablet Take 50 mg by mouth 3 (three) times daily.     Multiple Vitamins-Minerals (MULTIVITAMIN WITH MINERALS) tablet Take 1 tablet by mouth daily.     pantoprazole (PROTONIX) 40 MG tablet Take 1 tablet (40 mg total) by mouth daily. 30 tablet 3   sodium chloride (OCEAN) 0.65 % SOLN nasal spray Place 1 spray into both nostrils as needed for congestion. 88 mL 0   sulfamethoxazole-trimethoprim (BACTRIM DS) 800-160 MG tablet Take 1 tablet by mouth 2 (two) times daily. 14 tablet 0    tamsulosin (FLOMAX) 0.4 MG CAPS capsule Take 0.4 mg by mouth.     valsartan (DIOVAN) 160 MG tablet Take 1 tablet (160 mg total) by mouth daily. This replaces losartan 90 tablet 3   VITAMIN D, CHOLECALCIFEROL, PO Take 1 tablet by mouth daily.     zinc gluconate 50 MG tablet Take 50 mg by mouth daily.     No current facility-administered medications on file prior to visit.   No Known Allergies Social History   Socioeconomic History   Marital status: Married    Spouse name: Not on file   Number of children: Not on file   Years of education: Not on file   Highest education level: Not on file  Occupational History   Not on file  Tobacco Use   Smoking status: Every Day    Packs/day: 1.00    Years: 30.00    Pack years: 30.00    Types: Cigarettes   Smokeless tobacco: Never  Substance and Sexual Activity   Alcohol use: No   Drug use: No   Sexual activity: Not on file  Other Topics Concern   Not on file  Social History Narrative   Not on file   Social Determinants of Health   Financial Resource Strain: Not on file  Food Insecurity: Not on file  Transportation  Needs: Not on file  Physical Activity: Not on file  Stress: Not on file  Social Connections: Not on file  Intimate Partner Violence: Not on file      Review of Systems  All other systems reviewed and are negative.     Objective:   Physical Exam Vitals reviewed.  Constitutional:      General: He is not in acute distress.    Appearance: He is not ill-appearing or toxic-appearing.  HENT:     Right Ear: Tympanic membrane and ear canal normal.     Left Ear: Tympanic membrane and ear canal normal.     Nose: Congestion and rhinorrhea present.     Right Sinus: Frontal sinus tenderness present.     Left Sinus: Frontal sinus tenderness present.  Cardiovascular:     Rate and Rhythm: Normal rate and regular rhythm.     Pulses: Normal pulses.     Heart sounds: Normal heart sounds.  Pulmonary:     Effort: Pulmonary  effort is normal. No respiratory distress.     Breath sounds: Decreased air movement present. No stridor. Decreased breath sounds and wheezing present. No rhonchi or rales.  Chest:     Chest wall: No tenderness.  Abdominal:     General: Bowel sounds are normal. There is no distension.     Palpations: Abdomen is soft.     Tenderness: There is no abdominal tenderness. There is no guarding.  Musculoskeletal:     Right lower leg: No edema.     Left lower leg: No edema.  Neurological:     General: No focal deficit present.     Mental Status: He is alert and oriented to person, place, and time.     Cranial Nerves: No cranial nerve deficit.     Sensory: No sensory deficit.     Motor: No weakness.     Coordination: Coordination normal.     Gait: Gait normal.          Assessment & Plan:  COPD exacerbation ([place]) I believe the patient may be having a COPD exacerbation/bronchitis.  Begin a prednisone taper pack in addition to doxycycline 100 mg twice daily for 7 days and reassess next week.  Seek medical attention immediately if he develops worsening shortness of breath or chest pain.  We also discussed starting a maintenance medication such as Symbicort or Breztri or referral for formal pulmonary function test.  The patient declines this at the present time.  He states that he only feels this way occasionally when he gets sick.

## 2021-09-12 ENCOUNTER — Telehealth: Payer: Self-pay | Admitting: Family Medicine

## 2021-09-12 NOTE — Telephone Encounter (Signed)
Left message for patient to call back and schedule Medicare Annual Wellness Visit (AWV) in office.  ° °If not able to come in office, please offer to do virtually or by telephone.  Left office number and my jabber #[phone]. ° °Due for AWVI ° °Please schedule at anytime with Nurse Health Advisor. °  °

## 2021-10-11 ENCOUNTER — Other Ambulatory Visit: Payer: Self-pay

## 2021-10-11 ENCOUNTER — Ambulatory Visit (INDEPENDENT_AMBULATORY_CARE_PROVIDER_SITE_OTHER): Payer: Medicare Other

## 2021-10-11 VITALS — Ht 71.0 in | Wt 217.0 lb

## 2021-10-11 DIAGNOSIS — J439 Emphysema, unspecified: Secondary | ICD-10-CM | POA: Insufficient documentation

## 2021-10-11 DIAGNOSIS — Z0000 Encounter for general adult medical examination without abnormal findings: Secondary | ICD-10-CM

## 2021-10-11 DIAGNOSIS — I10 Essential (primary) hypertension: Secondary | ICD-10-CM | POA: Insufficient documentation

## 2021-10-11 NOTE — Patient Instructions (Signed)
[name] , Thank you for taking time to come for your Medicare Wellness Visit. I appreciate your ongoing commitment to your health goals. Please review the following plan we discussed and let me know if I can assist you in the future.   Screening recommendations/referrals: Colonoscopy: Done 2022 at [facility] in [facility]. Follow up as scheduled.  Recommended yearly ophthalmology/optometry visit for glaucoma screening and checkup Recommended yearly dental visit for hygiene and checkup  Vaccinations: Influenza vaccine: Declined. Pneumococcal vaccine: Done 05/02/2014 and 08/22/2014 Tdap vaccine: Done 01/05/2012 Repeat in 10 years  Shingles vaccine: Discussed.    Covid-19: Declined.   Advanced directives: Please bring a copy of your health care power of attorney and living will to the office to be added to your chart at your convenience.   Conditions/risks identified: Aim to drink 6-8 glasses of water and eat lots of fruits and vegetables.   Next appointment: Follow up in one year for your annual wellness visit. 2024.  Preventive Care 28 Years and Older, Male  Preventive care refers to lifestyle choices and visits with your health care provider that can promote health and wellness. What does preventive care include? A yearly physical exam. This is also called an annual well check. Dental exams once or twice a year. Routine eye exams. Ask your health care provider how often you should have your eyes checked. Personal lifestyle choices, including: Daily care of your teeth and gums. Regular physical activity. Eating a healthy diet. Avoiding tobacco and drug use. Limiting alcohol use. Practicing safe sex. Taking low doses of aspirin every day. Taking vitamin and mineral supplements as recommended by your health care provider. What happens during an annual well check? The services and screenings done by your health care provider during your annual well check will depend on your age, overall  health, lifestyle risk factors, and family history of disease. Counseling  Your health care provider may ask you questions about your: Alcohol use. Tobacco use. Drug use. Emotional well-being. Home and relationship well-being. Sexual activity. Eating habits. History of falls. Memory and ability to understand (cognition). Work and work Statistician. Screening  You may have the following tests or measurements: Height, weight, and BMI. Blood pressure. Lipid and cholesterol levels. These may be checked every 5 years, or more frequently if you are over 22 years old. Skin check. Lung cancer screening. You may have this screening every year starting at age 94 if you have a 30-pack-year history of smoking and currently smoke or have quit within the past 15 years. Fecal occult blood test (FOBT) of the stool. You may have this test every year starting at age 87. Flexible sigmoidoscopy or colonoscopy. You may have a sigmoidoscopy every 5 years or a colonoscopy every 10 years starting at age 37. Prostate cancer screening. Recommendations will vary depending on your family history and other risks. Hepatitis C blood test. Hepatitis B blood test. Sexually transmitted disease (STD) testing. Diabetes screening. This is done by checking your blood sugar (glucose) after you have not eaten for a while (fasting). You may have this done every 1-3 years. Abdominal aortic aneurysm (AAA) screening. You may need this if you are a current or former smoker. Osteoporosis. You may be screened starting at age 32 if you are at high risk. Talk with your health care provider about your test results, treatment options, and if necessary, the need for more tests. Vaccines  Your health care provider may recommend certain vaccines, such as: Influenza vaccine. This is recommended  every year. Tetanus, diphtheria, and acellular pertussis (Tdap, Td) vaccine. You may need a Td booster every 10 years. Zoster vaccine. You may  need this after age 62. Pneumococcal 13-valent conjugate (PCV13) vaccine. One dose is recommended after age 56. Pneumococcal polysaccharide (PPSV23) vaccine. One dose is recommended after age 6. Talk to your health care provider about which screenings and vaccines you need and how often you need them. This information is not intended to replace advice given to you by your health care provider. Make sure you discuss any questions you have with your health care provider. Document Released: 09/21/2015 Document Revised: 05/14/2016 Document Reviewed: 06/26/2015 Elsevier Interactive Patient Education  2017 [place] Prevention in the Home Falls can cause injuries. They can happen to people of all ages. There are many things you can do to make your home safe and to help prevent falls. What can I do on the outside of my home? Regularly fix the edges of walkways and driveways and fix any cracks. Remove anything that might make you trip as you walk through a door, such as a raised step or threshold. Trim any bushes or trees on the path to your home. Use bright outdoor lighting. Clear any walking paths of anything that might make someone trip, such as rocks or tools. Regularly check to see if handrails are loose or broken. Make sure that both sides of any steps have handrails. Any raised decks and porches should have guardrails on the edges. Have any leaves, snow, or ice cleared regularly. Use sand or salt on walking paths during winter. Clean up any spills in your garage right away. This includes oil or grease spills. What can I do in the bathroom? Use night lights. Install grab bars by the toilet and in the tub and shower. Do not use towel bars as grab bars. Use non-skid mats or decals in the tub or shower. If you need to sit down in the shower, use a plastic, non-slip stool. Keep the floor dry. Clean up any water that spills on the floor as soon as it happens. Remove soap buildup in  the tub or shower regularly. Attach bath mats securely with double-sided non-slip rug tape. Do not have throw rugs and other things on the floor that can make you trip. What can I do in the bedroom? Use night lights. Make sure that you have a light by your bed that is easy to reach. Do not use any sheets or blankets that are too big for your bed. They should not hang down onto the floor. Have a firm chair that has side arms. You can use this for support while you get dressed. Do not have throw rugs and other things on the floor that can make you trip. What can I do in the kitchen? Clean up any spills right away. Avoid walking on wet floors. Keep items that you use a lot in easy-to-reach places. If you need to reach something above you, use a strong step stool that has a grab bar. Keep electrical cords out of the way. Do not use floor polish or wax that makes floors slippery. If you must use wax, use non-skid floor wax. Do not have throw rugs and other things on the floor that can make you trip. What can I do with my stairs? Do not leave any items on the stairs. Make sure that there are handrails on both sides of the stairs and use them. Fix handrails that are broken  or loose. Make sure that handrails are as long as the stairways. Check any carpeting to make sure that it is firmly attached to the stairs. Fix any carpet that is loose or worn. Avoid having throw rugs at the top or bottom of the stairs. If you do have throw rugs, attach them to the floor with carpet tape. Make sure that you have a light switch at the top of the stairs and the bottom of the stairs. If you do not have them, ask someone to add them for you. What else can I do to help prevent falls? Wear shoes that: Do not have high heels. Have rubber bottoms. Are comfortable and fit you well. Are closed at the toe. Do not wear sandals. If you use a stepladder: Make sure that it is fully opened. Do not climb a closed  stepladder. Make sure that both sides of the stepladder are locked into place. Ask someone to hold it for you, if possible. Clearly mark and make sure that you can see: Any grab bars or handrails. First and last steps. Where the edge of each step is. Use tools that help you move around (mobility aids) if they are needed. These include: Canes. Walkers. Scooters. Crutches. Turn on the lights when you go into a dark area. Replace any light bulbs as soon as they burn out. Set up your furniture so you have a clear path. Avoid moving your furniture around. If any of your floors are uneven, fix them. If there are any pets around you, be aware of where they are. Review your medicines with your doctor. Some medicines can make you feel dizzy. This can increase your chance of falling. Ask your doctor what other things that you can do to help prevent falls. This information is not intended to replace advice given to you by your health care provider. Make sure you discuss any questions you have with your health care provider. Document Released: 06/21/2009 Document Revised: 01/31/2016 Document Reviewed: 09/29/2014 Elsevier Interactive Patient Education  2017 [employer].

## 2021-10-11 NOTE — Progress Notes (Signed)
Subjective:   James Fletcher is a 76 y.o. male who presents for an Initial Medicare Annual Wellness Visit. Virtual Visit via Telephone Note  I connected with  James Fletcher on 10/11/21 at  3:30 PM EST by telephone and verified that I am speaking with the correct person using two identifiers.  Location: Patient: Home Provider: [facility] Persons participating in the virtual visit: patient/Nurse Health Advisor   I discussed the limitations, risks, security and privacy concerns of performing an evaluation and management service by telephone and the availability of in person appointments. The patient expressed understanding and agreed to proceed.  Interactive audio and video telecommunications were attempted between this nurse and patient, however failed, due to patient having technical difficulties OR patient did not have access to video capability.  We continued and completed visit with audio only.  Some vital signs may be absent or patient reported.   James Driver, LPN  Review of Systems     Cardiac Risk Factors include: advanced age (>21men, >19 women);hypertension;smoking/ tobacco exposure;sedentary lifestyle;obesity (BMI >30kg/m2);male gender (Emphysema)  PHONE VISIT. PT AT HOME. NURSE AT [facility].    Objective:    Today's Vitals   10/11/21 1536 10/11/21 1538  Weight: 217 lb (98.4 kg)   Height: 5\' 11"  (1.803 m)   PainSc:  10-Worst pain ever   Body mass index is 30.27 kg/m.  Advanced Directives 10/11/2021 05/01/2014 02/27/2012  Does Patient Have a Medical Advance Directive? Yes No Patient has advance directive, copy not in chart  Type of Advance Directive [place];Living will - -  Copy of [place] in Chart? No - copy requested - -  Would patient like information on creating a medical advance directive? No - Patient declined No - patient declined information -    Current Medications (verified) Outpatient Encounter Medications as of  10/11/2021  Medication Sig   acetaminophen (TYLENOL) 325 MG tablet Take 650 mg by mouth every 6 (six) hours as needed.   aspirin 81 MG tablet Take 81 mg by mouth daily.   atorvastatin (LIPITOR) 80 MG tablet Take 80 mg by mouth daily.   carbamazepine (TEGRETOL) 200 MG tablet TAKE TWO TABLETS BY MOUTH TWO TIMES A DAY   finasteride (PROSCAR) 5 MG tablet TAKE ONE TABLET BY MOUTH EVERY DAY FOR PROSTATE. TAKE WITH TAMSULOSIN.   fluticasone (FLONASE) 50 MCG/ACT nasal spray INSTILL 2 SPRAYS INTO EACH NOSTRIL EVERY DAY ALLERGIES   gabapentin (NEURONTIN) 400 MG capsule Take 1,200 mg by mouth 3 (three) times daily.    hydrALAZINE (APRESOLINE) 50 MG tablet Take 50 mg by mouth 3 (three) times daily.   methocarbamol (ROBAXIN) 500 MG tablet TAKE TWO TABLETS BY MOUTH TWO TIMES A DAY AS NEEDED FOR MUSCLE SPASMS   Multiple Vitamins-Minerals (MULTIVITAMIN WITH MINERALS) tablet Take 1 tablet by mouth daily.   pantoprazole (PROTONIX) 40 MG tablet Take 1 tablet (40 mg total) by mouth daily.   sodium chloride (OCEAN) 0.65 % SOLN nasal spray Place 1 spray into both nostrils as needed for congestion.   tamsulosin (FLOMAX) 0.4 MG CAPS capsule Take 0.4 mg by mouth.   valsartan (DIOVAN) 160 MG tablet Take 1 tablet (160 mg total) by mouth daily. This replaces losartan   VITAMIN D, CHOLECALCIFEROL, PO Take 1 tablet by mouth daily.   zinc gluconate 50 MG tablet Take 50 mg by mouth daily.   [DISCONTINUED] gabapentin (NEURONTIN) 400 MG capsule TAKE TWO CAPSULES BY MOUTH THREE TIMES A DAY FOR  NERVE PAIN   [DISCONTINUED] doxycycline (VIBRA-TABS) 100 MG tablet Take 1 tablet (100 mg total) by mouth 2 (two) times daily.   [DISCONTINUED] predniSONE (DELTASONE) 20 MG tablet 3 tabs poqday 1-2, 2 tabs poqday 3-4, 1 tab poqday 5-6   [DISCONTINUED] sulfamethoxazole-trimethoprim (BACTRIM DS) 800-160 MG tablet Take 1 tablet by mouth 2 (two) times daily.   No facility-administered encounter medications on file as of 10/11/2021.    Allergies  (verified) Patient has no known allergies.   History: Past Medical History:  Diagnosis Date   Cholecystitis    Left bundle-branch block 02/26/2012   Neuropathy 02/26/2012   Peripheral neuropathy    Past Surgical History:  Procedure Laterality Date   APPENDECTOMY     CHOLECYSTECTOMY N/A 05/01/2014   Procedure: LAPAROSCOPIC CHOLECYSTECTOMY;  Surgeon: Zenovia Jarred, MD;  Location: [facility];  Service: General;  Laterality: N/A;   HERNIA REPAIR     No family history on file. Social History   Socioeconomic History   Marital status: Married    Spouse name: James Fletcher   Number of children: 2   Years of education: Not on file   Highest education level: Not on file  Occupational History   Not on file  Tobacco Use   Smoking status: Every Day    Packs/day: 1.00    Years: 30.00    Pack years: 30.00    Types: Cigarettes   Smokeless tobacco: Never  Substance and Sexual Activity   Alcohol use: No   Drug use: No   Sexual activity: Not on file  Other Topics Concern   Not on file  Social History Narrative   1 son and 1 daughter.   Social Determinants of Health   Financial Resource Strain: Low Risk    Difficulty of Paying Living Expenses: Not hard at all  Food Insecurity: No Food Insecurity   Worried About Charity fundraiser in the Last Year: Never true   [place] in the Last Year: Never true  Transportation Needs: No Transportation Needs   Lack of Transportation (Medical): No   Lack of Transportation (Non-Medical): No  Physical Activity: Inactive   Days of Exercise per Week: 0 days   Minutes of Exercise per Session: 0 min  Stress: Stress Concern Present   Feeling of Stress : To some extent  Social Connections: Engineer, building services of Communication with Friends and Family: More than three times a week   Frequency of Social Gatherings with Friends and Family: More than three times a week   Attends Religious Services: 1 to 4 times per year   Active Member of [employer]  or Organizations: No   Attends Music therapist: 1 to 4 times per year   Marital Status: Married    Tobacco Counseling Ready to quit: Not Answered Counseling given: Not Answered   Clinical Intake:  Pre-visit preparation completed: Yes  Pain : 0-10 Pain Score: 10-Worst pain ever Pain Type: Chronic pain Pain Location: Back Pain Descriptors / Indicators: Aching, Sharp, Shooting Pain Onset: More than a month ago Pain Frequency: Intermittent     BMI - recorded: 30.27 Nutritional Status: BMI > 30  Obese Nutritional Risks: None Diabetes: No  How often do you need to have someone help you when you read instructions, pamphlets, or other written materials from your doctor or pharmacy?: 1 - Never  Diabetic?no  Interpreter Needed?: No  Information entered by :: mj Caoimhe Damron, lpn   Activities of Daily Living In  your present state of health, do you have any difficulty performing the following activities: 10/11/2021  Hearing? N  Vision? N  Difficulty concentrating or making decisions? N  Walking or climbing stairs? N  Dressing or bathing? N  Doing errands, shopping? N  Preparing Food and eating ? N  Using the Toilet? N  In the past six months, have you accidently leaked urine? N  Do you have problems with loss of bowel control? N  Managing your Medications? N  Managing your Finances? N  Housekeeping or managing your Housekeeping? N  Some recent data might be hidden    Patient Care Team: Susy Frizzle, MD as PCP - General (Family Medicine)  Indicate any recent Medical Services you may have received from other than [facility] providers in the past year (date may be approximate).     Assessment:   This is a routine wellness examination for [place].  Hearing/Vision screen Hearing Screening - Comments:: Hearing issues, wears hearing aids. Vision Screening - Comments:: Readers. Eye exams, [place]. 2022.  Dietary issues and exercise activities discussed: Current  Exercise Habits: The patient does not participate in regular exercise at present, Exercise limited by: orthopedic condition(s);cardiac condition(s);neurologic condition(s)   Goals Addressed             This Visit's Progress    Exercise 3x per week (30 min per time)       Pt would like to be able to get back pain under control so he can exercise and go fly fishing.        Depression Screen PHQ 2/9 Scores 10/11/2021 07/09/2018 02/18/2018  PHQ - 2 Score 1 0 0    Fall Risk Fall Risk  10/11/2021 07/09/2018 02/18/2018  Falls in the past year? 1 0 No  Number falls in past yr: 0 - -  Injury with Fall? 0 - -  Risk for fall due to : Impaired balance/gait;Impaired mobility;History of fall(s) - -  Follow up Falls prevention discussed Falls evaluation completed -    FALL RISK PREVENTION PERTAINING TO THE HOME:  Any stairs in or around the home? Yes  If so, are there any without handrails? No  Home free of loose throw rugs in walkways, pet beds, electrical cords, etc? Yes  Adequate lighting in your home to reduce risk of falls? Yes   ASSISTIVE DEVICES UTILIZED TO PREVENT FALLS:  Life alert? No  Use of a cane, walker or w/c? Yes  Grab bars in the bathroom? Yes  Shower chair or bench in shower? No  Elevated toilet seat or a handicapped toilet? Yes   TIMED UP AND GO:  Was the test performed? No .  Phone visit.   Cognitive Function:     6CIT Screen 10/11/2021  What Year? 0 points  What month? 0 points  What time? 0 points  Count back from 20 0 points  Months in reverse 0 points  Repeat phrase 0 points  Total Score 0    Immunizations Immunization History  Administered Date(s) Administered   Influenza, High Dose Seasonal PF 06/10/2016   Influenza, Seasonal, Injecte, Preservative Fre 08/22/2014   Influenza-Unspecified 08/17/2006, 10/19/2007, 07/04/2008, 08/03/2017, 06/30/2018   Pneumococcal Conjugate-13 08/22/2014   Pneumococcal Polysaccharide-23 08/17/2006, 01/05/2012,  05/02/2014   Tdap 01/05/2012   Zoster, Live 06/10/2016    TDAP status: Up to date  Flu Vaccine status: Declined, Education has been provided regarding the importance of this vaccine but patient still declined. Advised may receive this vaccine at local  pharmacy or Health Dept. Aware to provide a copy of the vaccination record if obtained from local pharmacy or Health Dept. Verbalized acceptance and understanding.  Pneumococcal vaccine status: Completed during today's visit.  Covid-19 vaccine status: Declined, Education has been provided regarding the importance of this vaccine but patient still declined. Advised may receive this vaccine at local pharmacy or Health Dept.or vaccine clinic. Aware to provide a copy of the vaccination record if obtained from local pharmacy or Health Dept. Verbalized acceptance and understanding.  Qualifies for Shingles Vaccine? Yes   Zostavax completed No   Shingrix Completed?: No.    Education has been provided regarding the importance of this vaccine. Patient has been advised to call insurance company to determine out of pocket expense if they have not yet received this vaccine. Advised may also receive vaccine at local pharmacy or Health Dept. Verbalized acceptance and understanding.  Screening Tests Health Maintenance  Topic Date Due   COVID-19 Vaccine (1) Never done   Zoster Vaccines- Shingrix (1 of 2) Never done   INFLUENZA VACCINE  04/08/2021   TETANUS/TDAP  01/04/2022   COLONOSCOPY (Pts 45-31yrs Insurance coverage will need to be confirmed)  07/09/2026   Pneumonia Vaccine 40+ Years old  Completed   Hepatitis C Screening  Completed   HPV VACCINES  Aged Out    Health Maintenance  Health Maintenance Due  Topic Date Due   COVID-19 Vaccine (1) Never done   Zoster Vaccines- Shingrix (1 of 2) Never done   INFLUENZA VACCINE  04/08/2021    Colorectal cancer screening: Type of screening: Colonoscopy. Completed 07/09/2021. Repeat every 5 years  Lung  Cancer Screening: (Low Dose CT Chest recommended if Age 76-80 years, 30 pack-year currently smoking OR have quit w/in 15years.) does not qualify.    Additional Screening:  Hepatitis C Screening: does qualify; Completed 02/27/2012  Vision Screening: Recommended annual ophthalmology exams for early detection of glaucoma and other disorders of the eye. Is the patient up to date with their annual eye exam?  Yes  Who is the provider or what is the name of the office in which the patient attends annual eye exams? [place] If pt is not established with a provider, would they like to be referred to a provider to establish care? No .   Dental Screening: Recommended annual dental exams for proper oral hygiene  Community Resource Referral / Chronic Care Management: CRR required this visit?  No   CCM required this visit?  No      Plan:     I have personally reviewed and noted the following in the patients chart:   Medical and social history Use of alcohol, tobacco or illicit drugs  Current medications and supplements including opioid prescriptions. Patient is not currently taking opioid prescriptions. Functional ability and status Nutritional status Physical activity Advanced directives List of other physicians Hospitalizations, surgeries, and ER visits in previous 12 months Vitals Screenings to include cognitive, depression, and falls Referrals and appointments  In addition, I have reviewed and discussed with patient certain preventive protocols, quality metrics, and best practice recommendations. A written personalized care plan for preventive services as well as general preventive health recommendations were provided to patient.     James Driver, LPN   03/08/6966   Nurse Notes: Pt is up to date on health maintenance. Pt does colonoscopies at the [facility]. Discussed Shingrix, Covid and flu and how to obtain. Pt has been referred to a Pain Management Clinic for back  pain.

## 2021-12-02 ENCOUNTER — Other Ambulatory Visit: Payer: Self-pay | Admitting: Family Medicine

## 2021-12-03 ENCOUNTER — Other Ambulatory Visit: Payer: Self-pay

## 2021-12-12 ENCOUNTER — Telehealth: Payer: Self-pay | Admitting: *Deleted

## 2021-12-12 NOTE — Chronic Care Management (AMB) (Signed)
?  Care Management  ? ?Note ? ?12/12/2021 ?Name: James Fletcher MRN: [id number] DOB: [birth year] ? ?James Fletcher is a 76 y.o. year old male who is a primary care patient of Dennard Schaumann, Waino Mounsey, MD. I reached out to [name] by phone today offer care coordination services.  ? ?[name] was given information about care management services today including:  ?Care management services include personalized support from designated clinical staff supervised by his physician, including individualized plan of care and coordination with other care providers ?24/7 contact phone numbers for assistance for urgent and routine care needs. ?The patient may stop care management services at any time by phone call to the office staff. ? ?Patient agreed to services and verbal consent obtained.  ? ?Follow up plan: ?Telephone appointment with care management team member scheduled for:12/20/21 ? ?Laverda Sorenson  ?Care Guide, Embedded Care Coordination ?[facility]  ?Direct Dial: [phone] ? ?

## 2021-12-12 NOTE — Chronic Care Management (AMB) (Signed)
?  Care Management  ? ?Outreach Note ? ?12/12/2021 ?Name: James Fletcher MRN: [id number] DOB: [birth year] ? ?Referred by: Susy Frizzle, MD ?Reason for referral : Care Coordination (Initial outreach to schedule with RNCM ) ? ? ?An unsuccessful telephone outreach was attempted today. The patient was referred to the case management team for assistance with care management and care coordination.  ? ?Follow Up Plan:  ?A HIPAA compliant phone message was left for the patient providing contact information and requesting a return call.  ?The care management team will reach out to the patient again over the next 14 days.  ?If patient returns call to provider office, please advise to call [facility]* at [phone].* ? ?Laverda Sorenson  ?Care Guide, Embedded Care Coordination ?  Care Management  ?Direct Dial: [phone] ? ?

## 2021-12-20 ENCOUNTER — Telehealth: Payer: Self-pay | Admitting: *Deleted

## 2021-12-20 ENCOUNTER — Telehealth: Payer: Medicare Other

## 2021-12-20 NOTE — Telephone Encounter (Signed)
?  Care Management  ? ?Follow Up Note ? ? ?12/20/2021 ?Name: James Fletcher MRN: [id number] DOB: 05/30/1946 ? ? ?Referred by: Susy Frizzle, MD ?Reason for referral : Care Coordination (Emphysema, HTN) ? ? ?An unsuccessful telephone outreach was attempted today. The patient was referred to the case management team for assistance with care management and care coordination.  ? ?Follow Up Plan: Telephone follow up appointment with care management team member scheduled for:  upon care guide rescheduling. ? ?Jacqlyn Larsen RNC, BSN ?RN Case Manager ?[place] ?[phone] ? ? ?

## 2021-12-24 ENCOUNTER — Telehealth: Payer: Self-pay | Admitting: *Deleted

## 2021-12-24 NOTE — Chronic Care Management (AMB) (Signed)
?  Care Management  ? ?Note ? ?12/24/2021 ?Name: James Fletcher MRN: [id number] DOB: 28-Dec-1945 ? ?James Fletcher is a 76 y.o. year old male who is a primary care patient of Dennard Schaumann, Marino Rogerson, MD and is actively engaged with the care management team. I reached out to [name] by phone today to assist with re-scheduling an initial visit with the RN Case Manager ? ?Follow up plan: ?Patient declines further follow up and engagement by the care management team. Appropriate care team members and provider have been notified via electronic communication.  ? ?Laverda Sorenson  ?Care Guide, Embedded Care Coordination ?[place] [facility]  Care Management  ?Direct Dial: [phone] ? ?

## 2021-12-24 NOTE — Chronic Care Management (AMB) (Signed)
?  Care Management  ? ?Note ? ?12/24/2021 ?Name: James Fletcher MRN: [id number] DOB: 1946/08/08 ? ?James Fletcher is a 76 y.o. year old male who is a primary care patient of Dennard Schaumann, Granvil Djordjevic, MD and is actively engaged with the care management team. I reached out to [name] by phone today to assist with re-scheduling an initial visit with the RN Case Manager ? ?Follow up plan: ?Unsuccessful telephone outreach attempt made. A HIPAA compliant phone message was left for the patient providing contact information and requesting a return call.  ?The care management team will reach out to the patient again over the next 7 days.  ?If patient returns call to provider office, please advise to call [facility]  at [phone]. ? ?Laverda Sorenson  ?Care Guide, Embedded Care Coordination ?[facility]  Care Management  ?Direct Dial: [phone] ? ?

## 2022-01-22 DIAGNOSIS — G609 Hereditary and idiopathic neuropathy, unspecified: Secondary | ICD-10-CM | POA: Diagnosis not present

## 2022-01-22 DIAGNOSIS — M5440 Lumbago with sciatica, unspecified side: Secondary | ICD-10-CM | POA: Diagnosis not present

## 2022-01-22 DIAGNOSIS — G8929 Other chronic pain: Secondary | ICD-10-CM | POA: Diagnosis not present

## 2022-01-22 DIAGNOSIS — M545 Low back pain, unspecified: Secondary | ICD-10-CM | POA: Diagnosis not present

## 2022-01-22 DIAGNOSIS — G622 Polyneuropathy due to other toxic agents: Secondary | ICD-10-CM | POA: Diagnosis not present

## 2022-01-22 DIAGNOSIS — G8921 Chronic pain due to trauma: Secondary | ICD-10-CM | POA: Diagnosis not present

## 2022-01-22 DIAGNOSIS — G894 Chronic pain syndrome: Secondary | ICD-10-CM | POA: Diagnosis not present

## 2022-01-22 DIAGNOSIS — M961 Postlaminectomy syndrome, not elsewhere classified: Secondary | ICD-10-CM | POA: Diagnosis not present

## 2022-01-22 DIAGNOSIS — M1990 Unspecified osteoarthritis, unspecified site: Secondary | ICD-10-CM | POA: Diagnosis not present

## 2022-02-17 DIAGNOSIS — G8929 Other chronic pain: Secondary | ICD-10-CM | POA: Diagnosis not present

## 2022-02-17 DIAGNOSIS — M545 Low back pain, unspecified: Secondary | ICD-10-CM | POA: Diagnosis not present

## 2022-02-17 DIAGNOSIS — M1990 Unspecified osteoarthritis, unspecified site: Secondary | ICD-10-CM | POA: Diagnosis not present

## 2022-02-17 DIAGNOSIS — M961 Postlaminectomy syndrome, not elsewhere classified: Secondary | ICD-10-CM | POA: Diagnosis not present

## 2022-02-17 DIAGNOSIS — G622 Polyneuropathy due to other toxic agents: Secondary | ICD-10-CM | POA: Diagnosis not present

## 2022-02-17 DIAGNOSIS — G894 Chronic pain syndrome: Secondary | ICD-10-CM | POA: Diagnosis not present

## 2022-02-17 DIAGNOSIS — M5440 Lumbago with sciatica, unspecified side: Secondary | ICD-10-CM | POA: Diagnosis not present

## 2022-02-17 DIAGNOSIS — G8921 Chronic pain due to trauma: Secondary | ICD-10-CM | POA: Diagnosis not present

## 2022-02-17 DIAGNOSIS — G609 Hereditary and idiopathic neuropathy, unspecified: Secondary | ICD-10-CM | POA: Diagnosis not present

## 2022-03-17 DIAGNOSIS — G609 Hereditary and idiopathic neuropathy, unspecified: Secondary | ICD-10-CM | POA: Diagnosis not present

## 2022-03-17 DIAGNOSIS — M545 Low back pain, unspecified: Secondary | ICD-10-CM | POA: Diagnosis not present

## 2022-03-17 DIAGNOSIS — G622 Polyneuropathy due to other toxic agents: Secondary | ICD-10-CM | POA: Diagnosis not present

## 2022-03-17 DIAGNOSIS — G8921 Chronic pain due to trauma: Secondary | ICD-10-CM | POA: Diagnosis not present

## 2022-03-17 DIAGNOSIS — G8929 Other chronic pain: Secondary | ICD-10-CM | POA: Diagnosis not present

## 2022-03-17 DIAGNOSIS — M961 Postlaminectomy syndrome, not elsewhere classified: Secondary | ICD-10-CM | POA: Diagnosis not present

## 2022-03-17 DIAGNOSIS — M1990 Unspecified osteoarthritis, unspecified site: Secondary | ICD-10-CM | POA: Diagnosis not present

## 2022-03-17 DIAGNOSIS — G894 Chronic pain syndrome: Secondary | ICD-10-CM | POA: Diagnosis not present

## 2022-03-17 DIAGNOSIS — M5440 Lumbago with sciatica, unspecified side: Secondary | ICD-10-CM | POA: Diagnosis not present

## 2022-04-21 DIAGNOSIS — G622 Polyneuropathy due to other toxic agents: Secondary | ICD-10-CM | POA: Diagnosis not present

## 2022-04-21 DIAGNOSIS — G8921 Chronic pain due to trauma: Secondary | ICD-10-CM | POA: Diagnosis not present

## 2022-04-21 DIAGNOSIS — M1990 Unspecified osteoarthritis, unspecified site: Secondary | ICD-10-CM | POA: Diagnosis not present

## 2022-04-21 DIAGNOSIS — M545 Low back pain, unspecified: Secondary | ICD-10-CM | POA: Diagnosis not present

## 2022-04-21 DIAGNOSIS — G609 Hereditary and idiopathic neuropathy, unspecified: Secondary | ICD-10-CM | POA: Diagnosis not present

## 2022-04-21 DIAGNOSIS — G8929 Other chronic pain: Secondary | ICD-10-CM | POA: Diagnosis not present

## 2022-04-21 DIAGNOSIS — M5440 Lumbago with sciatica, unspecified side: Secondary | ICD-10-CM | POA: Diagnosis not present

## 2022-04-21 DIAGNOSIS — M961 Postlaminectomy syndrome, not elsewhere classified: Secondary | ICD-10-CM | POA: Diagnosis not present

## 2022-04-21 DIAGNOSIS — G894 Chronic pain syndrome: Secondary | ICD-10-CM | POA: Diagnosis not present

## 2022-05-07 ENCOUNTER — Other Ambulatory Visit: Payer: Self-pay | Admitting: Anesthesiology

## 2022-05-13 ENCOUNTER — Other Ambulatory Visit (HOSPITAL_COMMUNITY): Payer: Self-pay | Admitting: Anesthesiology

## 2022-05-13 DIAGNOSIS — G894 Chronic pain syndrome: Secondary | ICD-10-CM | POA: Diagnosis not present

## 2022-05-13 DIAGNOSIS — M5440 Lumbago with sciatica, unspecified side: Secondary | ICD-10-CM | POA: Diagnosis not present

## 2022-05-13 DIAGNOSIS — G8929 Other chronic pain: Secondary | ICD-10-CM | POA: Diagnosis not present

## 2022-05-13 DIAGNOSIS — F419 Anxiety disorder, unspecified: Secondary | ICD-10-CM | POA: Diagnosis not present

## 2022-05-13 DIAGNOSIS — M791 Myalgia, unspecified site: Secondary | ICD-10-CM | POA: Diagnosis not present

## 2022-05-13 DIAGNOSIS — G622 Polyneuropathy due to other toxic agents: Secondary | ICD-10-CM | POA: Diagnosis not present

## 2022-05-13 DIAGNOSIS — G8921 Chronic pain due to trauma: Secondary | ICD-10-CM | POA: Diagnosis not present

## 2022-05-13 DIAGNOSIS — M62830 Muscle spasm of back: Secondary | ICD-10-CM | POA: Diagnosis not present

## 2022-05-13 DIAGNOSIS — M961 Postlaminectomy syndrome, not elsewhere classified: Secondary | ICD-10-CM | POA: Diagnosis not present

## 2022-05-13 DIAGNOSIS — M1990 Unspecified osteoarthritis, unspecified site: Secondary | ICD-10-CM | POA: Diagnosis not present

## 2022-05-13 DIAGNOSIS — M545 Low back pain, unspecified: Secondary | ICD-10-CM | POA: Diagnosis not present

## 2022-05-13 DIAGNOSIS — G609 Hereditary and idiopathic neuropathy, unspecified: Secondary | ICD-10-CM | POA: Diagnosis not present

## 2022-05-17 ENCOUNTER — Ambulatory Visit (HOSPITAL_COMMUNITY)
Admission: RE | Admit: 2022-05-17 | Discharge: 2022-05-17 | Disposition: A | Discharge status: Home / Self Care | Payer: Medicare Other | Source: Ambulatory Visit | Attending: Anesthesiology | Admitting: Anesthesiology

## 2022-05-17 DIAGNOSIS — M5440 Lumbago with sciatica, unspecified side: Secondary | ICD-10-CM | POA: Diagnosis not present

## 2022-05-17 DIAGNOSIS — M542 Cervicalgia: Secondary | ICD-10-CM | POA: Diagnosis not present

## 2022-05-17 DIAGNOSIS — M47812 Spondylosis without myelopathy or radiculopathy, cervical region: Secondary | ICD-10-CM | POA: Diagnosis not present

## 2022-05-19 ENCOUNTER — Other Ambulatory Visit: Payer: Self-pay | Admitting: Family Medicine

## 2022-05-21 NOTE — Telephone Encounter (Signed)
Requested medication (s) are due for refill today: yes  Requested medication (s) are on the active medication list: yes  Last refill:  12/03/21 #90 1 RF  Future visit scheduled: no  Notes to clinic:  called and advised pt that he is due for OV for med refill. Pt stated he will call back to make the appointment. Pt is suffering from insomnia and seemed overwhelmed. Can he have a refill    Requested Prescriptions  Pending Prescriptions Disp Refills   valsartan (DIOVAN) 160 MG tablet [Pharmacy Med Name: Valsartan 160 MG Oral Tablet] 90 tablet 0    Sig: TAKE 1 TABLET BY MOUTH ONCE DAILY THIS  REPLACES  LOSARTAN     Cardiovascular:  Angiotensin Receptor Blockers Failed - 05/19/2022  6:30 PM      Failed - Cr in normal range and within 180 days    Creat  Date Value Ref Range Status  02/18/2019 0.62 (L) 0.70 - 1.18 mg/dL Final    Comment:    For patients >60 years of age, the reference limit for Creatinine is approximately 13% higher for people identified as African-American. .          Failed - K in normal range and within 180 days    Potassium  Date Value Ref Range Status  02/18/2019 4.5 3.5 - 5.3 mmol/L Final         Failed - Last BP in normal range    BP Readings from Last 1 Encounters:  09/06/21 (!) 146/82         Failed - Valid encounter within last 6 months    Recent Outpatient Visits           8 months ago COPD exacerbation ([place])   [place] Pickard, Cammie Mcgee, MD   10 months ago Toe pain, right   [place] Pick[facility], Cammie Mcgee, MD   1 year ago Upper respiratory tract infection, unspecified type   [place] [facility] Eulogio Bear, NP   1 year ago Visit for suture removal   [facility] Pick[facility], Cammie Mcgee, MD   1 year ago Sebaceous cyst   [facility], Cammie Mcgee, MD              Passed - Patient is not pregnant

## 2022-05-23 NOTE — Telephone Encounter (Signed)
Pls call pt for an annual w/pcp for med eval/future refills

## 2022-06-11 DIAGNOSIS — M62830 Muscle spasm of back: Secondary | ICD-10-CM | POA: Diagnosis not present

## 2022-06-11 DIAGNOSIS — G8929 Other chronic pain: Secondary | ICD-10-CM | POA: Diagnosis not present

## 2022-06-11 DIAGNOSIS — M545 Low back pain, unspecified: Secondary | ICD-10-CM | POA: Diagnosis not present

## 2022-06-11 DIAGNOSIS — G622 Polyneuropathy due to other toxic agents: Secondary | ICD-10-CM | POA: Diagnosis not present

## 2022-06-11 DIAGNOSIS — M5440 Lumbago with sciatica, unspecified side: Secondary | ICD-10-CM | POA: Diagnosis not present

## 2022-06-11 DIAGNOSIS — G609 Hereditary and idiopathic neuropathy, unspecified: Secondary | ICD-10-CM | POA: Diagnosis not present

## 2022-06-11 DIAGNOSIS — G8921 Chronic pain due to trauma: Secondary | ICD-10-CM | POA: Diagnosis not present

## 2022-06-11 DIAGNOSIS — F419 Anxiety disorder, unspecified: Secondary | ICD-10-CM | POA: Diagnosis not present

## 2022-06-11 DIAGNOSIS — M791 Myalgia, unspecified site: Secondary | ICD-10-CM | POA: Diagnosis not present

## 2022-06-11 DIAGNOSIS — M961 Postlaminectomy syndrome, not elsewhere classified: Secondary | ICD-10-CM | POA: Diagnosis not present

## 2022-06-11 DIAGNOSIS — M1990 Unspecified osteoarthritis, unspecified site: Secondary | ICD-10-CM | POA: Diagnosis not present

## 2022-06-11 DIAGNOSIS — G894 Chronic pain syndrome: Secondary | ICD-10-CM | POA: Diagnosis not present

## 2022-07-02 ENCOUNTER — Other Ambulatory Visit: Payer: Self-pay

## 2022-07-02 ENCOUNTER — Telehealth: Payer: Self-pay | Admitting: Family Medicine

## 2022-07-02 ENCOUNTER — Other Ambulatory Visit: Payer: Medicare Other

## 2022-07-02 DIAGNOSIS — I447 Left bundle-branch block, unspecified: Secondary | ICD-10-CM

## 2022-07-02 DIAGNOSIS — I10 Essential (primary) hypertension: Secondary | ICD-10-CM

## 2022-07-02 MED ORDER — VALSARTAN 160 MG PO TABS: ORAL_TABLET | ORAL | 0 refills | Status: DC

## 2022-07-02 NOTE — Telephone Encounter (Signed)
Patient came to office for lab work; reported he takes two new medications prescribed by the [facility]  - HYDROCOD/ACETAM 5-'325MG'$  TAB MAL Generic: HODROCODONE-ACETAMINOPHEN Date filled: 06/06/2022  - DIAZEPAM '2MG'$  TAB TEV Generic: VALIUM '2MG'$  TAB Date filled: 06/11/2022  Both prescribed by Dr. Nathanial Rancher.   Please advise patient at [phone]

## 2022-07-07 ENCOUNTER — Ambulatory Visit (INDEPENDENT_AMBULATORY_CARE_PROVIDER_SITE_OTHER): Payer: Medicare Other | Admitting: Family Medicine

## 2022-07-07 ENCOUNTER — Encounter: Payer: Self-pay | Admitting: Family Medicine

## 2022-07-07 VITALS — BP 132/78 | HR 65 | Ht 70.0 in | Wt 217.2 lb

## 2022-07-07 DIAGNOSIS — M5416 Radiculopathy, lumbar region: Secondary | ICD-10-CM | POA: Insufficient documentation

## 2022-07-07 DIAGNOSIS — J439 Emphysema, unspecified: Secondary | ICD-10-CM | POA: Diagnosis not present

## 2022-07-07 DIAGNOSIS — Z0389 Encounter for observation for other suspected diseases and conditions ruled out: Secondary | ICD-10-CM | POA: Insufficient documentation

## 2022-07-07 DIAGNOSIS — I10 Essential (primary) hypertension: Secondary | ICD-10-CM | POA: Diagnosis not present

## 2022-07-07 DIAGNOSIS — F4312 Post-traumatic stress disorder, chronic: Secondary | ICD-10-CM | POA: Insufficient documentation

## 2022-07-07 DIAGNOSIS — E7800 Pure hypercholesterolemia, unspecified: Secondary | ICD-10-CM

## 2022-07-07 NOTE — Progress Notes (Signed)
Subjective:    Patient ID: James Fletcher, male    DOB: [birth year], 76 y.o.   MRN: [id number]  HPI   Patient reports severe pain in his neck.  He has been referred to a pain specialist through the [facility] and is taking Valium and hydrocodone.  However he states that the pain medication is not working and that he is in misery and only sleeping 2 hours every evening.  I have recommended that he contact the pain clinic to see what next options he has.  He is due for a flu shot as well as a COVID booster but he adamantly refused these.  He is also due for lung cancer screening given his smoking history however he states that they have already done this at the [facility].  His blood pressure today is excellent at 132/78 but he is due for fasting lab work Past Medical History:  Diagnosis Date   Cholecystitis    Left bundle-branch block 02/26/2012   Neuropathy 02/26/2012   Peripheral neuropathy    Past Surgical History:  Procedure Laterality Date   APPENDECTOMY     CHOLECYSTECTOMY N/A 05/01/2014   Procedure: LAPAROSCOPIC CHOLECYSTECTOMY;  Surgeon: Zenovia Jarred, MD;  Location: [facility];  Service: General;  Laterality: N/A;   HERNIA REPAIR     Current Outpatient Medications on File Prior to Visit  Medication Sig Dispense Refill   acetaminophen (TYLENOL) 325 MG tablet Take 650 mg by mouth every 6 (six) hours as needed.     aspirin 81 MG tablet Take 81 mg by mouth daily.     atorvastatin (LIPITOR) 80 MG tablet Take 80 mg by mouth daily.     carbamazepine (TEGRETOL) 200 MG tablet TAKE TWO TABLETS BY MOUTH TWO TIMES A DAY     finasteride (PROSCAR) 5 MG tablet TAKE ONE TABLET BY MOUTH EVERY DAY FOR PROSTATE. TAKE WITH TAMSULOSIN.     fluticasone (FLONASE) 50 MCG/ACT nasal spray INSTILL 2 SPRAYS INTO EACH NOSTRIL EVERY DAY ALLERGIES     gabapentin (NEURONTIN) 400 MG capsule Take 1,200 mg by mouth 3 (three) times daily.      hydrALAZINE (APRESOLINE) 50 MG tablet Take 50 mg by mouth 3 (three) times daily.      methocarbamol (ROBAXIN) 500 MG tablet TAKE TWO TABLETS BY MOUTH TWO TIMES A DAY AS NEEDED FOR MUSCLE SPASMS     Multiple Vitamins-Minerals (MULTIVITAMIN WITH MINERALS) tablet Take 1 tablet by mouth daily.     pantoprazole (PROTONIX) 40 MG tablet Take 1 tablet (40 mg total) by mouth daily. 30 tablet 3   sodium chloride (OCEAN) 0.65 % SOLN nasal spray Place 1 spray into both nostrils as needed for congestion. 88 mL 0   tamsulosin (FLOMAX) 0.4 MG CAPS capsule Take 0.4 mg by mouth.     traMADol (ULTRAM) 50 MG tablet Take 1 tablet by mouth every 12 (twelve) hours as needed.     valsartan (DIOVAN) 160 MG tablet TAKE 1 TABLET BY MOUTH ONCE DAILY .  THIS  REPLACES  LOSARTAN 30 tablet 0   VITAMIN D, CHOLECALCIFEROL, PO Take 1 tablet by mouth daily.     zinc gluconate 50 MG tablet Take 50 mg by mouth daily.     No current facility-administered medications on file prior to visit.   No Known Allergies Social History   Socioeconomic History   Marital status: Married    Spouse name: [name]   Number of children: 2   Years of education: Not on file  Highest education level: Not on file  Occupational History   Not on file  Tobacco Use   Smoking status: Every Day    Packs/day: 1.00    Years: 30.00    Total pack years: 30.00    Types: Cigarettes   Smokeless tobacco: Never  Substance and Sexual Activity   Alcohol use: No   Drug use: No   Sexual activity: Not on file  Other Topics Concern   Not on file  Social History Narrative   1 son and 1 daughter.   Social Determinants of Health   Financial Resource Strain: Low Risk  (10/11/2021)   Overall Financial Resource Strain (CARDIA)    Difficulty of Paying Living Expenses: Not hard at all  Food Insecurity: No Food Insecurity (10/11/2021)   Hunger Vital Sign    Worried About Running Out of Food in the Last Year: Never true    Ran Out of Food in the Last Year: Never true  Transportation Needs: No Transportation Needs (10/11/2021)   PRAPARE -  Hydrologist (Medical): No    Lack of Transportation (Non-Medical): No  Physical Activity: Inactive (10/11/2021)   Exercise Vital Sign    Days of Exercise per Week: 0 days    Minutes of Exercise per Session: 0 min  Stress: Stress Concern Present (10/11/2021)   [place]    Feeling of Stress : To some extent  Social Connections: Socially Integrated (10/11/2021)   Social Connection and Isolation Panel [NHANES]    Frequency of Communication with Friends and Family: More than three times a week    Frequency of Social Gatherings with Friends and Family: More than three times a week    Attends Religious Services: 1 to 4 times per year    Active Member of [employer] or Organizations: No    Attends Archivist Meetings: 1 to 4 times per year    Marital Status: Married  Human resources officer Violence: Not At Risk (10/11/2021)   Humiliation, Afraid, Rape, and Kick questionnaire    Fear of Current or Ex-Partner: No    Emotionally Abused: No    Physically Abused: No    Sexually Abused: No      Review of Systems  All other systems reviewed and are negative.      Objective:   Physical Exam Vitals reviewed.  Constitutional:      General: He is not in acute distress.    Appearance: He is not ill-appearing or toxic-appearing.  Cardiovascular:     Rate and Rhythm: Normal rate and regular rhythm.     Pulses: Normal pulses.     Heart sounds: Normal heart sounds.  Pulmonary:     Effort: Pulmonary effort is normal. No respiratory distress.     Breath sounds: No stridor or decreased air movement. No decreased breath sounds, wheezing, rhonchi or rales.  Chest:     Chest wall: No tenderness.  Abdominal:     General: Bowel sounds are normal. There is no distension.     Palpations: Abdomen is soft.     Tenderness: There is no abdominal tenderness. There is no guarding.  Musculoskeletal:     Right lower  leg: No edema.     Left lower leg: No edema.  Neurological:     General: No focal deficit present.     Mental Status: He is alert and oriented to person, place, and time.     Cranial  Nerves: No cranial nerve deficit.     Sensory: No sensory deficit.     Motor: No weakness.     Coordination: Coordination normal.     Gait: Gait normal.           Assessment & Plan:  Essential (primary) hypertension - Plan: CBC with Differential/Platelet, COMPLETE METABOLIC PANEL WITH GFR, Lipid panel  Pulmonary emphysema, unspecified emphysema type (HCC)  Pure hypercholesterolemia Recommend smoking cessation.  Recommended a flu shot.  Recommended COVID booster.  Blood pressure is outstanding.  I will check a CBC a CMP and a lipid panel.  Goal LDL cholesterol is less than 100.  Per the patient's report they have screening for lung cancer at the [facility].  His AAA screening is up-to-date.  Recommended he follow back up with his pain clinic regarding the severe pain in his neck

## 2022-07-08 LAB — CBC WITH DIFFERENTIAL/PLATELET
Absolute Monocytes: 619 cells/uL (ref 200–950)
Basophils Absolute: 61 cells/uL (ref 0–200)
Basophils Relative: 0.9 %
Eosinophils Absolute: 82 cells/uL (ref 15–500)
Eosinophils Relative: 1.2 %
HCT: 31.5 % — ABNORMAL LOW (ref 38.5–50.0)
Hemoglobin: 10.7 g/dL — ABNORMAL LOW (ref 13.2–17.1)
Lymphs Abs: 1319 cells/uL (ref 850–3900)
MCH: 29.3 pg (ref 27.0–33.0)
MCHC: 34 g/dL (ref 32.0–36.0)
MCV: 86.3 fL (ref 80.0–100.0)
MPV: 9.8 fL (ref 7.5–12.5)
Monocytes Relative: 9.1 %
Neutro Abs: 4719 cells/uL (ref 1500–7800)
Neutrophils Relative %: 69.4 %
Platelets: 223 10*3/uL (ref 140–400)
RBC: 3.65 10*6/uL — ABNORMAL LOW (ref 4.20–5.80)
RDW: 13.3 % (ref 11.0–15.0)
Total Lymphocyte: 19.4 %
WBC: 6.8 10*3/uL (ref 3.8–10.8)

## 2022-07-08 LAB — COMPLETE METABOLIC PANEL WITH GFR
AG Ratio: 2 (calc) (ref 1.0–2.5)
ALT: 17 U/L (ref 9–46)
AST: 17 U/L (ref 10–35)
Albumin: 4.2 g/dL (ref 3.6–5.1)
Alkaline phosphatase (APISO): 90 U/L (ref 35–144)
BUN/Creatinine Ratio: 13 (calc) (ref 6–22)
BUN: 8 mg/dL (ref 7–25)
CO2: 25 mmol/L (ref 20–32)
Calcium: 8.8 mg/dL (ref 8.6–10.3)
Chloride: 96 mmol/L — ABNORMAL LOW (ref 98–110)
Creat: 0.64 mg/dL — ABNORMAL LOW (ref 0.70–1.28)
Globulin: 2.1 g/dL (calc) (ref 1.9–3.7)
Glucose, Bld: 84 mg/dL (ref 65–99)
Potassium: 4.3 mmol/L (ref 3.5–5.3)
Sodium: 131 mmol/L — ABNORMAL LOW (ref 135–146)
Total Bilirubin: 0.4 mg/dL (ref 0.2–1.2)
Total Protein: 6.3 g/dL (ref 6.1–8.1)
eGFR: 98 mL/min/{1.73_m2} (ref >=60)

## 2022-07-08 LAB — LIPID PANEL
Cholesterol: 146 mg/dL (ref <200)
HDL: 52 mg/dL (ref >=40)
LDL Cholesterol (Calc): 71 mg/dL (calc)
Non-HDL Cholesterol (Calc): 94 mg/dL (calc) (ref <130)
Total CHOL/HDL Ratio: 2.8 (calc) (ref <5.0)
Triglycerides: 152 mg/dL — ABNORMAL HIGH (ref <150)

## 2022-09-12 ENCOUNTER — Encounter: Payer: Self-pay | Admitting: Family Medicine

## 2022-09-12 ENCOUNTER — Ambulatory Visit (INDEPENDENT_AMBULATORY_CARE_PROVIDER_SITE_OTHER): Payer: Medicare Other | Admitting: Family Medicine

## 2022-09-12 VITALS — BP 136/78 | HR 73 | Ht 70.0 in | Wt 216.0 lb

## 2022-09-12 DIAGNOSIS — M48061 Spinal stenosis, lumbar region without neurogenic claudication: Secondary | ICD-10-CM | POA: Diagnosis not present

## 2022-09-12 NOTE — Progress Notes (Signed)
Subjective:    Patient ID: James Fletcher, male    DOB: 08/16/1946, 77 y.o.   MRN: [id number]  Back Pain    Patient has a history of lumbar degenerative disc disease.  He states that he had surgery to repair a ruptured disc at L4-L5 in the past.  This was performed through the [facility].  He states that his MRI of his lumbar spine showed spinal stenosis.  He was referred to a pain clinic through the [facility].  At the pain clinic he has been receiving hydrocodone 5/325 1 tablet a day in the morning and Valium 2 mg at night to help with sleep.  He states the pain in his lower back is unbearable and is preventing him from walking and enjoying life.  He enjoys flyfishing however he is unable to stand for prolonged periods of time.  He denies any numbness or weakness in his legs or saddle anesthesia or bowel or bladder incontinence.  He states that he has a follow-up appointment with the [facility] but was curious as to what medication options he had Past Medical History:  Diagnosis Date   Cholecystitis    Left bundle-branch block 02/26/2012   Neuropathy 02/26/2012   Peripheral neuropathy    Past Surgical History:  Procedure Laterality Date   APPENDECTOMY     CHOLECYSTECTOMY N/A 05/01/2014   Procedure: LAPAROSCOPIC CHOLECYSTECTOMY;  Surgeon: Zenovia Jarred, MD;  Location: [facility];  Service: General;  Laterality: N/A;   HERNIA REPAIR     Current Outpatient Medications on File Prior to Visit  Medication Sig Dispense Refill   acetaminophen (TYLENOL) 325 MG tablet Take 650 mg by mouth every 6 (six) hours as needed.     aspirin 81 MG tablet Take 81 mg by mouth daily.     atorvastatin (LIPITOR) 80 MG tablet Take 80 mg by mouth daily.     carbamazepine (TEGRETOL) 200 MG tablet TAKE TWO TABLETS BY MOUTH TWO TIMES A DAY     diazepam (VALIUM) 2 MG tablet Take 2 mg by mouth every 6 (six) hours as needed for anxiety.     finasteride (PROSCAR) 5 MG tablet TAKE ONE TABLET BY MOUTH EVERY DAY FOR PROSTATE. TAKE WITH TAMSULOSIN.      fluticasone (FLONASE) 50 MCG/ACT nasal spray INSTILL 2 SPRAYS INTO EACH NOSTRIL EVERY DAY ALLERGIES     gabapentin (NEURONTIN) 400 MG capsule Take 1,200 mg by mouth 3 (three) times daily.      hydrALAZINE (APRESOLINE) 50 MG tablet Take 50 mg by mouth 3 (three) times daily.     HYDROcodone-acetaminophen (NORCO/VICODIN) 5-325 MG tablet Take 1 tablet by mouth every 6 (six) hours as needed for moderate pain.     methocarbamol (ROBAXIN) 500 MG tablet TAKE TWO TABLETS BY MOUTH TWO TIMES A DAY AS NEEDED FOR MUSCLE SPASMS     Multiple Vitamins-Minerals (MULTIVITAMIN WITH MINERALS) tablet Take 1 tablet by mouth daily.     pantoprazole (PROTONIX) 40 MG tablet Take 1 tablet (40 mg total) by mouth daily. 30 tablet 3   sodium chloride (OCEAN) 0.65 % SOLN nasal spray Place 1 spray into both nostrils as needed for congestion. 88 mL 0   tamsulosin (FLOMAX) 0.4 MG CAPS capsule Take 0.4 mg by mouth.     traMADol (ULTRAM) 50 MG tablet Take 1 tablet by mouth every 12 (twelve) hours as needed.     valsartan (DIOVAN) 160 MG tablet TAKE 1 TABLET BY MOUTH ONCE DAILY .  THIS  REPLACES  LOSARTAN 30 tablet 0   VITAMIN D, CHOLECALCIFEROL, PO Take 1 tablet by mouth daily.     zinc gluconate 50 MG tablet Take 50 mg by mouth daily.     No current facility-administered medications on file prior to visit.   No Known Allergies Social History   Socioeconomic History   Marital status: Married    Spouse name: [name]   Number of children: 2   Years of education: Not on file   Highest education level: Not on file  Occupational History   Not on file  Tobacco Use   Smoking status: Every Day    Packs/day: 1.00    Years: 30.00    Total pack years: 30.00    Types: Cigarettes   Smokeless tobacco: Never  Substance and Sexual Activity   Alcohol use: No   Drug use: No   Sexual activity: Not on file  Other Topics Concern   Not on file  Social History Narrative   1 son and 1 daughter.   Social Determinants of Health    Financial Resource Strain: Low Risk  (10/11/2021)   Overall Financial Resource Strain (CARDIA)    Difficulty of Paying Living Expenses: Not hard at all  Food Insecurity: No Food Insecurity (10/11/2021)   Hunger Vital Sign    Worried About Running Out of Food in the Last Year: Never true    Ran Out of Food in the Last Year: Never true  Transportation Needs: No Transportation Needs (10/11/2021)   PRAPARE - Hydrologist (Medical): No    Lack of Transportation (Non-Medical): No  Physical Activity: Inactive (10/11/2021)   Exercise Vital Sign    Days of Exercise per Week: 0 days    Minutes of Exercise per Session: 0 min  Stress: Stress Concern Present (10/11/2021)   [place]    Feeling of Stress : To some extent  Social Connections: Socially Integrated (10/11/2021)   Social Connection and Isolation Panel [NHANES]    Frequency of Communication with Friends and Family: More than three times a week    Frequency of Social Gatherings with Friends and Family: More than three times a week    Attends Religious Services: 1 to 4 times per year    Active Member of [employer] or Organizations: No    Attends Archivist Meetings: 1 to 4 times per year    Marital Status: Married  Human resources officer Violence: Not At Risk (10/11/2021)   Humiliation, Afraid, Rape, and Kick questionnaire    Fear of Current or Ex-Partner: No    Emotionally Abused: No    Physically Abused: No    Sexually Abused: No      Review of Systems  Musculoskeletal:  Positive for back pain.  All other systems reviewed and are negative.      Objective:   Physical Exam Vitals reviewed.  Constitutional:      General: He is not in acute distress.    Appearance: He is not ill-appearing or toxic-appearing.  Cardiovascular:     Rate and Rhythm: Normal rate and regular rhythm.     Pulses: Normal pulses.     Heart sounds: Normal heart  sounds.  Pulmonary:     Effort: Pulmonary effort is normal. No respiratory distress.     Breath sounds: No stridor or decreased air movement. No decreased breath sounds, wheezing, rhonchi or rales.  Chest:     Chest wall: No tenderness.  Abdominal:     General: Bowel sounds are normal. There is no distension.     Palpations: Abdomen is soft.     Tenderness: There is no abdominal tenderness. There is no guarding.  Musculoskeletal:     Lumbar back: Tenderness and bony tenderness present. Decreased range of motion.     Right lower leg: No edema.     Left lower leg: No edema.  Neurological:     General: No focal deficit present.     Mental Status: He is alert and oriented to person, place, and time.     Cranial Nerves: No cranial nerve deficit.     Sensory: No sensory deficit.     Motor: No weakness.     Coordination: Coordination normal.     Gait: Gait normal.           Assessment & Plan:  Spinal stenosis of lumbar region without neurogenic claudication It is hard to make recommendations without his records to review.  Therefore I would like to get his records through the [facility].  However if he has lumbar spinal stenosis, I have no problem with giving him Norco 5/325 tablet every 6 hours, 120 tablets a month.  I explained to the patient however that with escalating doses, there is increased risk of respiratory depression, addiction, constipation, etc.  Therefore, if he needed more than this I would recommend a referral to a pain clinic to discuss Suboxone due to his safety profile.  Patient will discuss this with his pain clinic.  I would need his old records and the opportunity to review them before I will start prescribing his pain medication

## 2022-09-17 DIAGNOSIS — G8921 Chronic pain due to trauma: Secondary | ICD-10-CM | POA: Diagnosis not present

## 2022-09-17 DIAGNOSIS — G609 Hereditary and idiopathic neuropathy, unspecified: Secondary | ICD-10-CM | POA: Diagnosis not present

## 2022-09-17 DIAGNOSIS — M961 Postlaminectomy syndrome, not elsewhere classified: Secondary | ICD-10-CM | POA: Diagnosis not present

## 2022-09-17 DIAGNOSIS — F419 Anxiety disorder, unspecified: Secondary | ICD-10-CM | POA: Diagnosis not present

## 2022-09-17 DIAGNOSIS — M791 Myalgia, unspecified site: Secondary | ICD-10-CM | POA: Diagnosis not present

## 2022-09-17 DIAGNOSIS — M62830 Muscle spasm of back: Secondary | ICD-10-CM | POA: Diagnosis not present

## 2022-09-17 DIAGNOSIS — G622 Polyneuropathy due to other toxic agents: Secondary | ICD-10-CM | POA: Diagnosis not present

## 2022-09-17 DIAGNOSIS — M1990 Unspecified osteoarthritis, unspecified site: Secondary | ICD-10-CM | POA: Diagnosis not present

## 2022-10-07 ENCOUNTER — Other Ambulatory Visit: Payer: Self-pay | Admitting: Family Medicine

## 2022-10-07 ENCOUNTER — Telehealth: Payer: Self-pay

## 2022-10-07 MED ORDER — HYDROCODONE-ACETAMINOPHEN 5-325 MG PO TABS: 1.0000 | ORAL_TABLET | Freq: Four times a day (QID) | ORAL | 0 refills | Status: DC | PRN

## 2022-10-07 NOTE — Telephone Encounter (Signed)
Pt called and states that since his last visit on 09/12/2022, he is having a hard time getting his Hydrocodone 5/325 through the [facility]. Pt asks if you would send him in a refill until [facility]? Pt states he has been out of medication since that visit.

## 2022-10-20 ENCOUNTER — Telehealth: Payer: Self-pay | Admitting: Family Medicine

## 2022-10-20 NOTE — Telephone Encounter (Signed)
Prescription Request  10/20/2022  Is this a "Controlled Substance" medicine? Yes  LOV: 09/12/2022  What is the name of the medication or equipment?    HYDROcodone-acetaminophen (NORCO/VICODIN) 5-325 MG tablet  **PATIENT TOOK LAST DOSE 2 DAYS AGO**  Have you contacted your pharmacy to request a refill? Yes   Which pharmacy would you like this sent to?  [place], [place] - [id number] [place] [place] [address] Phone: [phone] Fax: [phone]    Patient notified that their request is being sent to the clinical staff for review and that they should receive a response within 2 business days.   Please advise patient when script called in at [phone].

## 2022-10-27 ENCOUNTER — Ambulatory Visit (INDEPENDENT_AMBULATORY_CARE_PROVIDER_SITE_OTHER): Payer: Medicare Other | Admitting: Family Medicine

## 2022-10-27 VITALS — BP 132/76 | HR 76 | Ht 70.0 in | Wt 215.0 lb

## 2022-10-27 DIAGNOSIS — M48061 Spinal stenosis, lumbar region without neurogenic claudication: Secondary | ICD-10-CM | POA: Diagnosis not present

## 2022-10-27 MED ORDER — HYDROCODONE-ACETAMINOPHEN 5-325 MG PO TABS: 1.0000 | ORAL_TABLET | Freq: Four times a day (QID) | ORAL | 0 refills | Status: DC | PRN

## 2022-10-27 NOTE — Progress Notes (Signed)
Subjective:    Patient ID: James Fletcher, male    DOB: Jun 03, 1946, 77 y.o.   MRN: [id number]  Back Pain    Patient has a history of lumbar degenerative disc disease.  He states that he had surgery to repair a ruptured disc at L4-L5 in the past.  This was performed through the [place].  He states that his MRI of his lumbar spine showed spinal stenosis.  He was referred to a pain clinic through the [place]. I originally gave him 30 hydrocodone to last a month.  After 12 days, the patient called asking for refill.  He states that he was taking the medication the way it was prescribed on the bottle which is every 6 hours.  I explained to the patient that that was not what we had discussed.  I want him to use it as sparingly as possible.  He states that the pain clinic they gave him buprenorphine.  However with this he experienced hearing loss, dizziness, and urinary retention.  He tried the medication on 2 separate occasions and was unable to tolerate it.  On both occasions he experienced acute urinary retention and dizziness.  Therefore he would like to take the hydrocodone however I wanted to discuss parameters and agreed to a pain contract before we started Past Medical History:  Diagnosis Date   Cholecystitis    Left bundle-branch block 02/26/2012   Neuropathy 02/26/2012   Peripheral neuropathy    Past Surgical History:  Procedure Laterality Date   APPENDECTOMY     CHOLECYSTECTOMY N/A 05/01/2014   Procedure: LAPAROSCOPIC CHOLECYSTECTOMY;  Surgeon: Zenovia Jarred, MD;  Location: [facility];  Service: General;  Laterality: N/A;   HERNIA REPAIR     Current Outpatient Medications on File Prior to Visit  Medication Sig Dispense Refill   aspirin 81 MG tablet Take 81 mg by mouth daily.     atorvastatin (LIPITOR) 80 MG tablet Take 80 mg by mouth daily.     carbamazepine (TEGRETOL) 200 MG tablet TAKE TWO TABLETS BY MOUTH TWO TIMES A DAY     diazepam (VALIUM) 2 MG tablet Take 2 mg by mouth every 6 (six) hours  as needed for anxiety.     finasteride (PROSCAR) 5 MG tablet TAKE ONE TABLET BY MOUTH EVERY DAY FOR PROSTATE. TAKE WITH TAMSULOSIN.     fluticasone (FLONASE) 50 MCG/ACT nasal spray INSTILL 2 SPRAYS INTO EACH NOSTRIL EVERY DAY ALLERGIES     gabapentin (NEURONTIN) 400 MG capsule Take 1,200 mg by mouth 3 (three) times daily.      hydrALAZINE (APRESOLINE) 50 MG tablet Take 50 mg by mouth 3 (three) times daily.     methocarbamol (ROBAXIN) 500 MG tablet TAKE TWO TABLETS BY MOUTH TWO TIMES A DAY AS NEEDED FOR MUSCLE SPASMS     Multiple Vitamins-Minerals (MULTIVITAMIN WITH MINERALS) tablet Take 1 tablet by mouth daily.     pantoprazole (PROTONIX) 40 MG tablet Take 1 tablet (40 mg total) by mouth daily. 30 tablet 3   sodium chloride (OCEAN) 0.65 % SOLN nasal spray Place 1 spray into both nostrils as needed for congestion. 88 mL 0   tamsulosin (FLOMAX) 0.4 MG CAPS capsule Take 0.4 mg by mouth.     valsartan (DIOVAN) 160 MG tablet TAKE 1 TABLET BY MOUTH ONCE DAILY .  THIS  REPLACES  LOSARTAN 30 tablet 0   VITAMIN D, CHOLECALCIFEROL, PO Take 1 tablet by mouth daily.     zinc gluconate 50 MG tablet Take  50 mg by mouth daily.     No current facility-administered medications on file prior to visit.   No Known Allergies Social History   Socioeconomic History   Marital status: Married    Spouse name: [name]   Number of children: 2   Years of education: Not on file   Highest education level: Not on file  Occupational History   Not on file  Tobacco Use   Smoking status: Every Day    Packs/day: 1.00    Years: 30.00    Total pack years: 30.00    Types: Cigarettes   Smokeless tobacco: Never  Substance and Sexual Activity   Alcohol use: No   Drug use: No   Sexual activity: Not on file  Other Topics Concern   Not on file  Social History Narrative   1 son and 1 daughter.   Social Determinants of Health   Financial Resource Strain: Low Risk  (10/11/2021)   Overall Financial Resource Strain (CARDIA)     Difficulty of Paying Living Expenses: Not hard at all  Food Insecurity: No Food Insecurity (10/11/2021)   Hunger Vital Sign    Worried About Running Out of Food in the Last Year: Never true    Ran Out of Food in the Last Year: Never true  Transportation Needs: No Transportation Needs (10/11/2021)   PRAPARE - Hydrologist (Medical): No    Lack of Transportation (Non-Medical): No  Physical Activity: Inactive (10/11/2021)   Exercise Vital Sign    Days of Exercise per Week: 0 days    Minutes of Exercise per Session: 0 min  Stress: Stress Concern Present (10/11/2021)   [place]    Feeling of Stress : To some extent  Social Connections: Socially Integrated (10/11/2021)   Social Connection and Isolation Panel [NHANES]    Frequency of Communication with Friends and Family: More than three times a week    Frequency of Social Gatherings with Friends and Family: More than three times a week    Attends Religious Services: 1 to 4 times per year    Active Member of [employer] or Organizations: No    Attends Archivist Meetings: 1 to 4 times per year    Marital Status: Married  Human resources officer Violence: Not At Risk (10/11/2021)   Humiliation, Afraid, Rape, and Kick questionnaire    Fear of Current or Ex-Partner: No    Emotionally Abused: No    Physically Abused: No    Sexually Abused: No      Review of Systems  Musculoskeletal:  Positive for back pain.  All other systems reviewed and are negative.      Objective:   Physical Exam Vitals reviewed.  Constitutional:      General: He is not in acute distress.    Appearance: He is not ill-appearing or toxic-appearing.  Cardiovascular:     Rate and Rhythm: Normal rate and regular rhythm.     Pulses: Normal pulses.     Heart sounds: Normal heart sounds.  Pulmonary:     Effort: Pulmonary effort is normal. No respiratory distress.     Breath  sounds: No stridor or decreased air movement. No decreased breath sounds, wheezing, rhonchi or rales.  Chest:     Chest wall: No tenderness.  Abdominal:     General: Bowel sounds are normal. There is no distension.     Palpations: Abdomen is soft.  Tenderness: There is no abdominal tenderness. There is no guarding.  Musculoskeletal:     Lumbar back: Tenderness and bony tenderness present. Decreased range of motion.     Right lower leg: No edema.     Left lower leg: No edema.  Neurological:     General: No focal deficit present.     Mental Status: He is alert and oriented to person, place, and time.     Cranial Nerves: No cranial nerve deficit.     Sensory: No sensory deficit.     Motor: No weakness.     Coordination: Coordination normal.     Gait: Gait normal.           Assessment & Plan:  Spinal stenosis of lumbar region without neurogenic claudication - Plan: DRUG MONITOR, PANEL 1, W/CONF, URINE It is hard to make recommendations without his records to review.  Therefore I would like to get his records through the [place]. at this point, I am comfortable giving the patient 60 hydrocodone a month.  This would allow him to take 1 pill at night to help him sleep and 1 pill during the day to help with pain.  I explained that I wanted 60 tablets to last the month.  Patient is agreement with this.  He will no longer get any pain medication from the pain clinic.  Also recommended that he contact pain clinic and let them know of our pain contract here to avoid the appearance of drug-seeking.  I will refill his hydrocodone in 1 month.  Recommended a stool softener to avoid constipation

## 2022-10-28 LAB — DRUG MONITOR, PANEL 1, W/CONF, URINE
Amphetamines: NEGATIVE ng/mL (ref <500)
Barbiturates: NEGATIVE ng/mL (ref <300)
Benzodiazepines: NEGATIVE ng/mL (ref <100)
Cocaine Metabolite: NEGATIVE ng/mL (ref <150)
Creatinine: 106.2 mg/dL (ref >=20.0)
Marijuana Metabolite: NEGATIVE ng/mL (ref <20)
Methadone Metabolite: NEGATIVE ng/mL (ref <100)
Opiates: NEGATIVE ng/mL (ref <100)
Oxidant: NEGATIVE ug/mL (ref <200)
Oxycodone: NEGATIVE ng/mL (ref <100)
Phencyclidine: NEGATIVE ng/mL (ref <25)
pH: 7.4 (ref 4.5–9.0)

## 2022-10-28 LAB — DM TEMPLATE

## 2022-10-29 DIAGNOSIS — G609 Hereditary and idiopathic neuropathy, unspecified: Secondary | ICD-10-CM | POA: Diagnosis not present

## 2022-10-29 DIAGNOSIS — F419 Anxiety disorder, unspecified: Secondary | ICD-10-CM | POA: Diagnosis not present

## 2022-10-29 DIAGNOSIS — G8929 Other chronic pain: Secondary | ICD-10-CM | POA: Diagnosis not present

## 2022-10-29 DIAGNOSIS — M1990 Unspecified osteoarthritis, unspecified site: Secondary | ICD-10-CM | POA: Diagnosis not present

## 2022-10-29 DIAGNOSIS — G8921 Chronic pain due to trauma: Secondary | ICD-10-CM | POA: Diagnosis not present

## 2022-10-29 DIAGNOSIS — G622 Polyneuropathy due to other toxic agents: Secondary | ICD-10-CM | POA: Diagnosis not present

## 2022-10-29 DIAGNOSIS — M62830 Muscle spasm of back: Secondary | ICD-10-CM | POA: Diagnosis not present

## 2022-10-29 DIAGNOSIS — M961 Postlaminectomy syndrome, not elsewhere classified: Secondary | ICD-10-CM | POA: Diagnosis not present

## 2022-10-29 DIAGNOSIS — M791 Myalgia, unspecified site: Secondary | ICD-10-CM | POA: Diagnosis not present

## 2022-11-20 ENCOUNTER — Telehealth: Payer: Self-pay | Admitting: Family Medicine

## 2022-11-20 NOTE — Telephone Encounter (Signed)
Erroneous encounter. Please disregard.

## 2022-11-25 ENCOUNTER — Ambulatory Visit (INDEPENDENT_AMBULATORY_CARE_PROVIDER_SITE_OTHER): Payer: Medicare Other | Admitting: Family Medicine

## 2022-11-25 ENCOUNTER — Encounter: Payer: Self-pay | Admitting: Family Medicine

## 2022-11-25 VITALS — BP 126/68 | HR 71 | Temp 97.6°F | Ht 70.0 in | Wt 212.0 lb

## 2022-11-25 DIAGNOSIS — M48061 Spinal stenosis, lumbar region without neurogenic claudication: Secondary | ICD-10-CM | POA: Diagnosis not present

## 2022-11-25 NOTE — Progress Notes (Signed)
Subjective:    Patient ID: James Fletcher, male    DOB: 1945/10/07, 77 y.o.   MRN: [id number]  Back Pain    Patient has a history of lumbar degenerative disc disease.  He states that he had surgery to repair a ruptured disc at L4-L5 in the past.  This was performed through the [place].  He states that his MRI of his lumbar spine showed spinal stenosis.  He was referred to a pain clinic through the [place]. recently I agreed to give the patient 60 hydrocodone per month to help with his chronic back pain as well as with his peripheral neuropathy.  Today he presents with his wife requesting a letter stating that he is unable to work.  He is trying to get disability through the [place].  He states that he has chronic back pain stemming from degenerative disc disease and from spinal stenosis.  He had previous surgery as discussed above.  However, despite the surgery he continues to suffer from low back pain and peripheral neuropathy.  [facility] his [facility] and also referred him to the pain clinic however they were unable to treat the patient's pain with narcotics which is ultimately how they want him to my clinic requesting medication for pain.  I explained to the patient that I will be happy to write a letter on his behalf.  However, I have no documentation to provide his attorney it would help this case.  Specifically I do not have his MRI reports from the [place].  I do not have his operative notes.  I am not the doctor who referred him to the surgeon nor to the pain clinic.  I explained to the family that this would help prove his case as far as chronicity and severity of his pain.  I did not leave the bladder with the left leg with convincing having a lack of documentation and the fact that I just assumed his pain control 2 months ago. Past Medical History:  Diagnosis Date   Cholecystitis    Left bundle-branch block 02/26/2012   Neuropathy 02/26/2012   Peripheral neuropathy    Past Surgical  History:  Procedure Laterality Date   APPENDECTOMY     CHOLECYSTECTOMY N/A 05/01/2014   Procedure: LAPAROSCOPIC CHOLECYSTECTOMY;  Surgeon: Zenovia Jarred, MD;  Location: [facility];  Service: General;  Laterality: N/A;   HERNIA REPAIR     Current Outpatient Medications on File Prior to Visit  Medication Sig Dispense Refill   aspirin 81 MG tablet Take 81 mg by mouth daily.     atorvastatin (LIPITOR) 80 MG tablet Take 80 mg by mouth daily.     carbamazepine (TEGRETOL) 200 MG tablet TAKE TWO TABLETS BY MOUTH TWO TIMES A DAY     diazepam (VALIUM) 2 MG tablet Take 2 mg by mouth every 6 (six) hours as needed for anxiety.     finasteride (PROSCAR) 5 MG tablet TAKE ONE TABLET BY MOUTH EVERY DAY FOR PROSTATE. TAKE WITH TAMSULOSIN.     fluticasone (FLONASE) 50 MCG/ACT nasal spray INSTILL 2 SPRAYS INTO EACH NOSTRIL EVERY DAY ALLERGIES     gabapentin (NEURONTIN) 400 MG capsule Take 1,200 mg by mouth 3 (three) times daily.      hydrALAZINE (APRESOLINE) 50 MG tablet Take 50 mg by mouth 3 (three) times daily.     HYDROcodone-acetaminophen (NORCO/VICODIN) 5-325 MG tablet Take 1 tablet by mouth every 6 (six) hours as needed for moderate pain. 60 must  last 30 days 60 tablet 0   methocarbamol (ROBAXIN) 500 MG tablet TAKE TWO TABLETS BY MOUTH TWO TIMES A DAY AS NEEDED FOR MUSCLE SPASMS     Multiple Vitamins-Minerals (MULTIVITAMIN WITH MINERALS) tablet Take 1 tablet by mouth daily.     pantoprazole (PROTONIX) 40 MG tablet Take 1 tablet (40 mg total) by mouth daily. 30 tablet 3   sodium chloride (OCEAN) 0.65 % SOLN nasal spray Place 1 spray into both nostrils as needed for congestion. 88 mL 0   tamsulosin (FLOMAX) 0.4 MG CAPS capsule Take 0.4 mg by mouth.     valsartan (DIOVAN) 160 MG tablet TAKE 1 TABLET BY MOUTH ONCE DAILY .  THIS  REPLACES  LOSARTAN 30 tablet 0   VITAMIN D, CHOLECALCIFEROL, PO Take 1 tablet by mouth daily.     zinc gluconate 50 MG tablet Take 50 mg by mouth daily.     No current  facility-administered medications on file prior to visit.   No Known Allergies Social History   Socioeconomic History   Marital status: Married    Spouse name: [name]   Number of children: 2   Years of education: Not on file   Highest education level: Not on file  Occupational History   Not on file  Tobacco Use   Smoking status: Every Day    Packs/day: 1.00    Years: 30.00    Additional pack years: 0.00    Total pack years: 30.00    Types: Cigarettes   Smokeless tobacco: Never  Substance and Sexual Activity   Alcohol use: No   Drug use: No   Sexual activity: Not on file  Other Topics Concern   Not on file  Social History Narrative   1 son and 1 daughter.   Social Determinants of Health   Financial Resource Strain: Low Risk  (10/11/2021)   Overall Financial Resource Strain (CARDIA)    Difficulty of Paying Living Expenses: Not hard at all  Food Insecurity: No Food Insecurity (10/11/2021)   Hunger Vital Sign    Worried About Running Out of Food in the Last Year: Never true    Ran Out of Food in the Last Year: Never true  Transportation Needs: No Transportation Needs (10/11/2021)   PRAPARE - Hydrologist (Medical): No    Lack of Transportation (Non-Medical): No  Physical Activity: Inactive (10/11/2021)   Exercise Vital Sign    Days of Exercise per Week: 0 days    Minutes of Exercise per Session: 0 min  Stress: Stress Concern Present (10/11/2021)   [place]    Feeling of Stress : To some extent  Social Connections: Socially Integrated (10/11/2021)   Social Connection and Isolation Panel [NHANES]    Frequency of Communication with Friends and Family: More than three times a week    Frequency of Social Gatherings with Friends and Family: More than three times a week    Attends Religious Services: 1 to 4 times per year    Active Member of [employer] or Organizations: No    Attends Theatre manager Meetings: 1 to 4 times per year    Marital Status: Married  Human resources officer Violence: Not At Risk (10/11/2021)   Humiliation, Afraid, Rape, and Kick questionnaire    Fear of Current or Ex-Partner: No    Emotionally Abused: No    Physically Abused: No    Sexually Abused: No  Review of Systems  Musculoskeletal:  Positive for back pain.  All other systems reviewed and are negative.      Objective:   Physical Exam Vitals reviewed.  Constitutional:      General: He is not in acute distress.    Appearance: He is not ill-appearing or toxic-appearing.  Cardiovascular:     Rate and Rhythm: Normal rate and regular rhythm.     Pulses: Normal pulses.     Heart sounds: Normal heart sounds.  Pulmonary:     Effort: Pulmonary effort is normal. No respiratory distress.     Breath sounds: No stridor or decreased air movement. No decreased breath sounds, wheezing, rhonchi or rales.  Chest:     Chest wall: No tenderness.  Abdominal:     General: Bowel sounds are normal. There is no distension.     Palpations: Abdomen is soft.     Tenderness: There is no abdominal tenderness. There is no guarding.  Musculoskeletal:     Lumbar back: Tenderness and bony tenderness present. Decreased range of motion.     Right lower leg: No edema.     Left lower leg: No edema.  Neurological:     General: No focal deficit present.     Mental Status: He is alert and oriented to person, place, and time.     Cranial Nerves: No cranial nerve deficit.     Sensory: No sensory deficit.     Motor: No weakness.     Coordination: Coordination normal.     Gait: Gait normal.           Assessment & Plan:  Spinal stenosis of lumbar region without neurogenic claudication I reiterated what I told the patient at his last visit.  It is difficult for me to make recommendations without having his records to review.  I certainly trust the patient and I believe that he is dealing with low back pain.  I  know that he is dealing with peripheral neuropathy and I am willing to prescribe Vicodin 60 tablets/month.  However to write a letter detailing his inability to work, I will first need to review his documentation from the [place] including his MRI reports, operative notes, and documentation of what has been tried and failed so that I could include that in his letter.  I explained to the patient, it would make much more sense for the doctor who did all that to actually write the letter.  Since his doctor at the [place] is the one who has been treating his pain and referring him to the specialists above, I believe they would be the most appropriate doctor to outline what has been tried and failed which ultimately led to the conclusion that he is no longer able to work.  If the doctor at the [place] will not do this I will write the letter on his behalf however at the present time the letter will be nonspecific without documentation for me to review.  Patient and his wife stated that they understand.

## 2022-11-26 DIAGNOSIS — M62830 Muscle spasm of back: Secondary | ICD-10-CM | POA: Diagnosis not present

## 2022-11-26 DIAGNOSIS — M5440 Lumbago with sciatica, unspecified side: Secondary | ICD-10-CM | POA: Diagnosis not present

## 2022-11-26 DIAGNOSIS — G8929 Other chronic pain: Secondary | ICD-10-CM | POA: Diagnosis not present

## 2022-11-26 DIAGNOSIS — G609 Hereditary and idiopathic neuropathy, unspecified: Secondary | ICD-10-CM | POA: Diagnosis not present

## 2022-11-26 DIAGNOSIS — F419 Anxiety disorder, unspecified: Secondary | ICD-10-CM | POA: Diagnosis not present

## 2022-11-26 DIAGNOSIS — M791 Myalgia, unspecified site: Secondary | ICD-10-CM | POA: Diagnosis not present

## 2022-11-26 DIAGNOSIS — G8921 Chronic pain due to trauma: Secondary | ICD-10-CM | POA: Diagnosis not present

## 2022-11-26 DIAGNOSIS — M1990 Unspecified osteoarthritis, unspecified site: Secondary | ICD-10-CM | POA: Diagnosis not present

## 2022-11-26 DIAGNOSIS — G622 Polyneuropathy due to other toxic agents: Secondary | ICD-10-CM | POA: Diagnosis not present

## 2022-11-26 DIAGNOSIS — M961 Postlaminectomy syndrome, not elsewhere classified: Secondary | ICD-10-CM | POA: Diagnosis not present

## 2022-12-09 ENCOUNTER — Telehealth: Payer: Self-pay | Admitting: Family Medicine

## 2022-12-09 NOTE — Telephone Encounter (Signed)
[facility] to schedule their annual wellness visit. Appointment made for 12/11/2022.  Thank you,  Colletta Maryland,  [facility] Direct Dial ??[phone]

## 2022-12-24 ENCOUNTER — Telehealth: Payer: Self-pay | Admitting: Family Medicine

## 2022-12-24 DIAGNOSIS — G622 Polyneuropathy due to other toxic agents: Secondary | ICD-10-CM | POA: Diagnosis not present

## 2022-12-24 DIAGNOSIS — M791 Myalgia, unspecified site: Secondary | ICD-10-CM | POA: Diagnosis not present

## 2022-12-24 DIAGNOSIS — F419 Anxiety disorder, unspecified: Secondary | ICD-10-CM | POA: Diagnosis not present

## 2022-12-24 DIAGNOSIS — M62830 Muscle spasm of back: Secondary | ICD-10-CM | POA: Diagnosis not present

## 2022-12-24 DIAGNOSIS — M5440 Lumbago with sciatica, unspecified side: Secondary | ICD-10-CM | POA: Diagnosis not present

## 2022-12-24 DIAGNOSIS — M1990 Unspecified osteoarthritis, unspecified site: Secondary | ICD-10-CM | POA: Diagnosis not present

## 2022-12-24 DIAGNOSIS — M961 Postlaminectomy syndrome, not elsewhere classified: Secondary | ICD-10-CM | POA: Diagnosis not present

## 2022-12-24 DIAGNOSIS — G8921 Chronic pain due to trauma: Secondary | ICD-10-CM | POA: Diagnosis not present

## 2022-12-24 DIAGNOSIS — G8929 Other chronic pain: Secondary | ICD-10-CM | POA: Diagnosis not present

## 2022-12-24 DIAGNOSIS — G609 Hereditary and idiopathic neuropathy, unspecified: Secondary | ICD-10-CM | POA: Diagnosis not present

## 2022-12-24 NOTE — Telephone Encounter (Signed)
Contacted [name] to schedule their annual wellness visit. Appointment made for 01/01/2023.  Thank you,  Judeth Cornfield,  [facility] Clinical Support [facility] Direct Dial ??[phone]

## 2022-12-25 DIAGNOSIS — I781 Nevus, non-neoplastic: Secondary | ICD-10-CM | POA: Diagnosis not present

## 2022-12-25 DIAGNOSIS — L570 Actinic keratosis: Secondary | ICD-10-CM | POA: Diagnosis not present

## 2023-01-01 NOTE — Progress Notes (Deleted)
Subjective:   James Fletcher is a 77 y.o. male who presents for Medicare Annual/Subsequent preventive examination.  I connected with  James Fletcher on 01/01/23 by a {Video Enabled:26378::"video and audio"} enabled telemedicine application and verified that I am speaking with the correct person using two identifiers.  {Patient [phone]::"Home"}  {Provider Location:[phone]::"Home Office"}  I discussed the limitations of evaluation and management by telemedicine. The patient expressed understanding and agreed to proceed.  Review of Systems    ***       Objective:    There were no vitals filed for this visit. There is no height or weight on file to calculate BMI.     10/11/2021    3:52 PM 05/01/2014    1:41 PM 02/27/2012    8:00 AM  Advanced Directives  Does Patient Have a Medical Advance Directive? Yes No Patient has advance directive, copy not in chart  Type of Advance Directive Healthcare Power of [place];Living will    Copy of Healthcare Power of Attorney in Chart? No - copy requested    Would patient like information on creating a medical advance directive? No - Patient declined No - patient declined information     Current Medications (verified) Outpatient Encounter Medications as of 01/01/2023  Medication Sig   aspirin 81 MG tablet Take 81 mg by mouth daily.   atorvastatin (LIPITOR) 80 MG tablet Take 80 mg by mouth daily.   carbamazepine (TEGRETOL) 200 MG tablet TAKE TWO TABLETS BY MOUTH TWO TIMES A DAY   diazepam (VALIUM) 2 MG tablet Take 2 mg by mouth every 6 (six) hours as needed for anxiety.   finasteride (PROSCAR) 5 MG tablet TAKE ONE TABLET BY MOUTH EVERY DAY FOR PROSTATE. TAKE WITH TAMSULOSIN.   fluticasone (FLONASE) 50 MCG/ACT nasal spray INSTILL 2 SPRAYS INTO EACH NOSTRIL EVERY DAY ALLERGIES   gabapentin (NEURONTIN) 400 MG capsule Take 1,200 mg by mouth 3 (three) times daily.    hydrALAZINE (APRESOLINE) 50 MG tablet Take 50 mg by mouth 3 (three)  times daily.   HYDROcodone-acetaminophen (NORCO/VICODIN) 5-325 MG tablet Take 1 tablet by mouth every 6 (six) hours as needed for moderate pain. 60 must last 30 days   methocarbamol (ROBAXIN) 500 MG tablet TAKE TWO TABLETS BY MOUTH TWO TIMES A DAY AS NEEDED FOR MUSCLE SPASMS   Multiple Vitamins-Minerals (MULTIVITAMIN WITH MINERALS) tablet Take 1 tablet by mouth daily.   pantoprazole (PROTONIX) 40 MG tablet Take 1 tablet (40 mg total) by mouth daily.   sodium chloride (OCEAN) 0.65 % SOLN nasal spray Place 1 spray into both nostrils as needed for congestion.   tamsulosin (FLOMAX) 0.4 MG CAPS capsule Take 0.4 mg by mouth.   valsartan (DIOVAN) 160 MG tablet TAKE 1 TABLET BY MOUTH ONCE DAILY .  THIS  REPLACES  LOSARTAN   VITAMIN D, CHOLECALCIFEROL, PO Take 1 tablet by mouth daily.   zinc gluconate 50 MG tablet Take 50 mg by mouth daily.   No facility-administered encounter medications on file as of 01/01/2023.    Allergies (verified) Patient has no known allergies.   History: Past Medical History:  Diagnosis Date   Cholecystitis    Left bundle-branch block 02/26/2012   Neuropathy 02/26/2012   Peripheral neuropathy    Past Surgical History:  Procedure Laterality Date   APPENDECTOMY     CHOLECYSTECTOMY N/A 05/01/2014   Procedure: LAPAROSCOPIC CHOLECYSTECTOMY;  Surgeon: James Malady, MD;  Location: [facility] OR;  Service: General;  Laterality: N/A;   HERNIA REPAIR  No family history on file. Social History   Socioeconomic History   Marital status: Married    Spouse name: James Fletcher   Number of children: 2   Years of education: Not on file   Highest education level: Not on file  Occupational History   Not on file  Tobacco Use   Smoking status: Every Day    Packs/day: 1.00    Years: 30.00    Additional pack years: 0.00    Total pack years: 30.00    Types: Cigarettes   Smokeless tobacco: Never  Substance and Sexual Activity   Alcohol use: No   Drug use: No   Sexual activity: Not on  file  Other Topics Concern   Not on file  Social History Narrative   1 son and 1 daughter.   Social Determinants of Health   Financial Resource Strain: Low Risk  (10/11/2021)   Overall Financial Resource Strain (CARDIA)    Difficulty of Paying Living Expenses: Not hard at all  Food Insecurity: No Food Insecurity (10/11/2021)   Hunger Vital Sign    Worried About Running Out of Food in the Last Year: Never true    Ran Out of Food in the Last Year: Never true  Transportation Needs: No Transportation Needs (10/11/2021)   PRAPARE - Administrator, Civil Service (Medical): No    Lack of Transportation (Non-Medical): No  Physical Activity: Inactive (10/11/2021)   Exercise Vital Sign    Days of Exercise per Week: 0 days    Minutes of Exercise per Session: 0 min  Stress: Stress Concern Present (10/11/2021)   [facility] [employer] - Occupational Stress Questionnaire    Feeling of Stress : To some extent  Social Connections: Socially Integrated (10/11/2021)   Social Connection and Isolation Panel [NHANES]    Frequency of Communication with Friends and Family: More than three times a week    Frequency of Social Gatherings with Friends and Family: More than three times a week    Attends Religious Services: 1 to 4 times per year    Active Member of [employer] or Organizations: No    Attends Engineer, structural: 1 to 4 times per year    Marital Status: Married    Tobacco Counseling Ready to quit: Not Answered Counseling given: Not Answered   Clinical Intake:                 Diabetic?No          Activities of Daily Living     No data to display          Patient Care Team: James Brooks, MD as PCP - General (Family Medicine)  Indicate any recent Medical Services you may have received from other than [facility] providers in the past year (date may be approximate).     Assessment:   This is a routine wellness examination for  James Fletcher.  Hearing/Vision screen No results found.  Dietary issues and exercise activities discussed:     Goals Addressed   None    Depression Screen    10/27/2022    9:50 AM 07/07/2022    8:06 AM 10/11/2021    3:47 PM 07/09/2018   12:08 PM 02/18/2018    3:51 PM  PHQ 2/9 Scores  PHQ - 2 Score 0 0 1 0 0    Fall Risk    10/27/2022    9:50 AM 07/07/2022    8:06 AM 10/11/2021  3:58 PM 07/09/2018   12:08 PM 02/18/2018    3:51 PM  Fall Risk   Falls in the past year? 1 0 1 0 No  Number falls in past yr: 1 0 0    Injury with Fall? 1 0 0    Risk for fall due to : History of fall(s);Impaired balance/gait;Orthopedic patient No Fall Risks Impaired balance/gait;Impaired mobility;History of fall(s)    Follow up Education provided;Falls prevention discussed Falls prevention discussed Falls prevention discussed Falls evaluation completed     FALL RISK PREVENTION PERTAINING TO THE HOME:  Any stairs in or around the home? {YES/NO:21197} If so, are there any without handrails? {YES/NO:21197} Home free of loose throw rugs in walkways, pet beds, electrical cords, etc? {YES/NO:21197} Adequate lighting in your home to reduce risk of falls? {YES/NO:21197}  ASSISTIVE DEVICES UTILIZED TO PREVENT FALLS:  Life alert? {YES/NO:21197} Use of a cane, walker or w/c? {YES/NO:21197} Grab bars in the bathroom? {YES/NO:21197} Shower chair or bench in shower? {YES/NO:21197} Elevated toilet seat or a handicapped toilet? {YES/NO:21197}  TIMED UP AND GO:  Was the test performed? No . Telephonic visit  Cognitive Function:        10/11/2021    4:01 PM  6CIT Screen  What Year? 0 points  What month? 0 points  What time? 0 points  Count back from 20 0 points  Months in reverse 0 points  Repeat phrase 0 points  Total Score 0 points    Immunizations Immunization History  Administered Date(s) Administered   Influenza, High Dose Seasonal PF 06/10/2016   Influenza, Seasonal, Injecte, Preservative Fre  08/22/2014   Influenza-Unspecified 08/17/2006, 10/19/2007, 07/04/2008, 08/03/2017, 06/30/2018   Pneumococcal Conjugate-13 08/22/2014   Pneumococcal Polysaccharide-23 08/17/2006, 01/05/2012, 05/02/2014   Tdap 01/05/2012   Zoster, Live 06/10/2016    TDAP status: Due, Education has been provided regarding the importance of this vaccine. Advised may receive this vaccine at local pharmacy or Health Dept. Aware to provide a copy of the vaccination record if obtained from local pharmacy or Health Dept. Verbalized acceptance and understanding.  Flu Vaccine status: Up to date  Pneumococcal vaccine status: Up to date  Covid-19 vaccine status: Information provided on how to obtain vaccines.   Qualifies for Shingles Vaccine? Yes   Zostavax completed Yes   Shingrix Completed?: No.    Education has been provided regarding the importance of this vaccine. Patient has been advised to call insurance company to determine out of pocket expense if they have not yet received this vaccine. Advised may also receive vaccine at local pharmacy or Health Dept. Verbalized acceptance and understanding.  Screening Tests Health Maintenance  Topic Date Due   COVID-19 Vaccine (1) Never done   Zoster Vaccines- Shingrix (1 of 2) Never done   Medicare Annual Wellness (AWV)  10/11/2022   Lung Cancer Screening  07/10/2023 (Originally 10/31/2006)   INFLUENZA VACCINE  04/09/2023   Pneumonia Vaccine 19+ Years old  Completed   Hepatitis C Screening  Completed   HPV VACCINES  Aged Out   DTaP/Tdap/Td  Discontinued   COLONOSCOPY (Pts 45-68yrs Insurance coverage will need to be confirmed)  Discontinued    Health Maintenance  Health Maintenance Due  Topic Date Due   COVID-19 Vaccine (1) Never done   Zoster Vaccines- Shingrix (1 of 2) Never done   Medicare Annual Wellness (AWV)  10/11/2022    Colorectal cancer screening: No longer required.   Lung Cancer Screening: (Low Dose CT Chest recommended if Age 43-80 years, 27  pack-year currently smoking OR have quit w/in 15years.) does qualify.   Lung Cancer Screening Referral: ***  Additional Screening:  Hepatitis C Screening: does qualify; Completed 02/27/12  Vision Screening: Recommended annual ophthalmology exams for early detection of glaucoma and other disorders of the eye. Is the patient up to date with their annual eye exam?  {YES/NO:21197} Who is the provider or what is the name of the office in which the patient attends annual eye exams? *** If pt is not established with a provider, would they like to be referred to a provider to establish care? {YES/NO:21197}.   Dental Screening: Recommended annual dental exams for proper oral hygiene  Community Resource Referral / Chronic Care Management: CRR required this visit?  {YES/NO:21197}  CCM required this visit?  {YES/NO:21197}     Plan:     I have personally reviewed and noted the following in the patient's chart:   Medical and social history Use of alcohol, tobacco or illicit drugs  Current medications and supplements including opioid prescriptions. {Opioid [phone]} Functional ability and status Nutritional status Physical activity Advanced directives List of other physicians Hospitalizations, surgeries, and ER visits in previous 12 months Vitals Screenings to include cognitive, depression, and falls Referrals and appointments  In addition, I have reviewed and discussed with patient certain preventive protocols, quality metrics, and best practice recommendations. A written personalized care plan for preventive services as well as general preventive health recommendations were provided to patient.     Durwin Nora, [place]   1/61/0960   Due to this being a virtual visit, the after visit summary with patients personalized plan was offered to patient via mail or my-chart. ***Patient declined at this time./ Patient would like to access on my-chart/ per request,  patient was mailed a copy of AVS./ Patient preferred to pick up at office at next visit   Nurse Notes: ***

## 2023-01-05 DIAGNOSIS — M791 Myalgia, unspecified site: Secondary | ICD-10-CM | POA: Diagnosis not present

## 2023-01-05 DIAGNOSIS — M961 Postlaminectomy syndrome, not elsewhere classified: Secondary | ICD-10-CM | POA: Diagnosis not present

## 2023-01-05 DIAGNOSIS — G8929 Other chronic pain: Secondary | ICD-10-CM | POA: Diagnosis not present

## 2023-01-05 DIAGNOSIS — M62830 Muscle spasm of back: Secondary | ICD-10-CM | POA: Diagnosis not present

## 2023-01-05 DIAGNOSIS — G609 Hereditary and idiopathic neuropathy, unspecified: Secondary | ICD-10-CM | POA: Diagnosis not present

## 2023-01-05 DIAGNOSIS — G8921 Chronic pain due to trauma: Secondary | ICD-10-CM | POA: Diagnosis not present

## 2023-01-05 DIAGNOSIS — G622 Polyneuropathy due to other toxic agents: Secondary | ICD-10-CM | POA: Diagnosis not present

## 2023-01-05 DIAGNOSIS — M1990 Unspecified osteoarthritis, unspecified site: Secondary | ICD-10-CM | POA: Diagnosis not present

## 2023-01-05 DIAGNOSIS — M5440 Lumbago with sciatica, unspecified side: Secondary | ICD-10-CM | POA: Diagnosis not present

## 2023-01-05 DIAGNOSIS — F419 Anxiety disorder, unspecified: Secondary | ICD-10-CM | POA: Diagnosis not present

## 2023-02-23 DIAGNOSIS — L570 Actinic keratosis: Secondary | ICD-10-CM | POA: Diagnosis not present

## 2023-02-23 DIAGNOSIS — C44301 Unspecified malignant neoplasm of skin of nose: Secondary | ICD-10-CM | POA: Diagnosis not present

## 2023-02-23 DIAGNOSIS — D485 Neoplasm of uncertain behavior of skin: Secondary | ICD-10-CM | POA: Diagnosis not present

## 2023-02-23 DIAGNOSIS — L810 Postinflammatory hyperpigmentation: Secondary | ICD-10-CM | POA: Diagnosis not present

## 2023-04-07 DIAGNOSIS — C44311 Basal cell carcinoma of skin of nose: Secondary | ICD-10-CM | POA: Diagnosis not present

## 2023-06-01 ENCOUNTER — Observation Stay (HOSPITAL_COMMUNITY)
Admission: EM | Admit: 2023-06-01 | Discharge: 2023-06-01 | Disposition: A | Discharge status: Home / Self Care | Payer: No Typology Code available for payment source | Attending: Internal Medicine | Admitting: Internal Medicine

## 2023-06-01 ENCOUNTER — Encounter (HOSPITAL_COMMUNITY): Payer: Self-pay | Admitting: *Deleted

## 2023-06-01 ENCOUNTER — Emergency Department (HOSPITAL_COMMUNITY): Payer: No Typology Code available for payment source

## 2023-06-01 ENCOUNTER — Observation Stay (HOSPITAL_COMMUNITY): Payer: No Typology Code available for payment source

## 2023-06-01 ENCOUNTER — Other Ambulatory Visit: Payer: Self-pay

## 2023-06-01 DIAGNOSIS — R0609 Other forms of dyspnea: Secondary | ICD-10-CM | POA: Diagnosis present

## 2023-06-01 DIAGNOSIS — I5020 Unspecified systolic (congestive) heart failure: Secondary | ICD-10-CM | POA: Diagnosis present

## 2023-06-01 DIAGNOSIS — M5416 Radiculopathy, lumbar region: Secondary | ICD-10-CM | POA: Diagnosis present

## 2023-06-01 DIAGNOSIS — R001 Bradycardia, unspecified: Secondary | ICD-10-CM | POA: Diagnosis not present

## 2023-06-01 DIAGNOSIS — Z7982 Long term (current) use of aspirin: Secondary | ICD-10-CM | POA: Insufficient documentation

## 2023-06-01 DIAGNOSIS — G6289 Other specified polyneuropathies: Secondary | ICD-10-CM

## 2023-06-01 DIAGNOSIS — G629 Polyneuropathy, unspecified: Secondary | ICD-10-CM

## 2023-06-01 DIAGNOSIS — I10 Essential (primary) hypertension: Secondary | ICD-10-CM | POA: Diagnosis present

## 2023-06-01 DIAGNOSIS — R0989 Other specified symptoms and signs involving the circulatory and respiratory systems: Secondary | ICD-10-CM | POA: Diagnosis not present

## 2023-06-01 DIAGNOSIS — R9389 Abnormal findings on diagnostic imaging of other specified body structures: Secondary | ICD-10-CM | POA: Diagnosis not present

## 2023-06-01 DIAGNOSIS — R079 Chest pain, unspecified: Principal | ICD-10-CM | POA: Diagnosis present

## 2023-06-01 DIAGNOSIS — R0602 Shortness of breath: Secondary | ICD-10-CM | POA: Diagnosis not present

## 2023-06-01 LAB — BASIC METABOLIC PANEL
Anion gap: 11 (ref 5–15)
BUN: 15 mg/dL (ref 8–23)
CO2: 21 mmol/L — ABNORMAL LOW (ref 22–32)
Calcium: 8.5 mg/dL — ABNORMAL LOW (ref 8.9–10.3)
Chloride: 98 mmol/L (ref 98–111)
Creatinine, Ser: 0.72 mg/dL (ref 0.61–1.24)
GFR, Estimated: >60 mL/min (ref >60)
Glucose, Bld: 93 mg/dL (ref 70–99)
Potassium: 4.2 mmol/L (ref 3.5–5.1)
Sodium: 130 mmol/L — ABNORMAL LOW (ref 135–145)

## 2023-06-01 LAB — CBC
HCT: 29.7 % — ABNORMAL LOW (ref 39.0–52.0)
Hemoglobin: 9 g/dL — ABNORMAL LOW (ref 13.0–17.0)
MCH: 25.6 pg — ABNORMAL LOW (ref 26.0–34.0)
MCHC: 30.3 g/dL (ref 30.0–36.0)
MCV: 84.4 fL (ref 80.0–100.0)
Platelets: 219 10*3/uL (ref 150–400)
RBC: 3.52 MIL/uL — ABNORMAL LOW (ref 4.22–5.81)
RDW: 15.5 % (ref 11.5–15.5)
WBC: 6.9 10*3/uL (ref 4.0–10.5)
nRBC: 0 % (ref 0.0–0.2)

## 2023-06-01 LAB — TROPONIN I (HIGH SENSITIVITY)
Troponin I (High Sensitivity): 11 ng/L (ref <18)
Troponin I (High Sensitivity): 9 ng/L (ref <18)

## 2023-06-01 LAB — D-DIMER, QUANTITATIVE: D-Dimer, Quant: <0.27 ug/mL-FEU (ref 0.00–0.50)

## 2023-06-01 LAB — BRAIN NATRIURETIC PEPTIDE: B Natriuretic Peptide: 472.1 pg/mL — ABNORMAL HIGH (ref 0.0–100.0)

## 2023-06-01 MED ORDER — HYDROCODONE-ACETAMINOPHEN 5-325 MG PO TABS
1.0000 | ORAL_TABLET | Freq: Once | ORAL | Status: AC
  Administered 2023-06-01: 1 via ORAL
  Filled 2023-06-01: qty 1

## 2023-06-01 MED ORDER — TRIMETHOBENZAMIDE HCL 100 MG/ML IM SOLN: 200.0000 mg | Freq: Four times a day (QID) | INTRAMUSCULAR | Status: DC | PRN

## 2023-06-01 MED ORDER — ENOXAPARIN SODIUM 40 MG/0.4ML IJ SOSY: 40.0000 mg | PREFILLED_SYRINGE | INTRAMUSCULAR | Status: DC

## 2023-06-01 MED ORDER — ONDANSETRON HCL 4 MG/2ML IJ SOLN
4.0000 mg | Freq: Once | INTRAMUSCULAR | Status: AC
  Administered 2023-06-01: 4 mg via INTRAVENOUS
  Filled 2023-06-01: qty 2

## 2023-06-01 MED ORDER — ACETAMINOPHEN 650 MG RE SUPP: 650.0000 mg | Freq: Four times a day (QID) | RECTAL | Status: DC | PRN

## 2023-06-01 MED ORDER — HYDRALAZINE HCL 20 MG/ML IJ SOLN: 10.0000 mg | INTRAMUSCULAR | Status: DC | PRN

## 2023-06-01 MED ORDER — ACETAMINOPHEN 325 MG PO TABS: 650.0000 mg | ORAL_TABLET | Freq: Four times a day (QID) | ORAL | Status: DC | PRN

## 2023-06-01 MED ORDER — ASPIRIN 81 MG PO CHEW
324.0000 mg | CHEWABLE_TABLET | Freq: Once | ORAL | Status: AC
  Administered 2023-06-01: 324 mg via ORAL
  Filled 2023-06-01: qty 4

## 2023-06-01 MED ORDER — HYDROMORPHONE HCL 1 MG/ML IJ SOLN
0.5000 mg | Freq: Once | INTRAMUSCULAR | Status: AC
  Administered 2023-06-01: 0.5 mg via INTRAVENOUS
  Filled 2023-06-01: qty 1

## 2023-06-01 MED ORDER — ALBUTEROL SULFATE (2.5 MG/3ML) 0.083% IN NEBU: 2.5000 mg | INHALATION_SOLUTION | RESPIRATORY_TRACT | Status: DC | PRN

## 2023-06-01 MED ORDER — SODIUM CHLORIDE 0.9% FLUSH: 3.0000 mL | Freq: Two times a day (BID) | INTRAVENOUS | Status: DC

## 2023-06-01 NOTE — ED Triage Notes (Signed)
Nauseated for several days  sob tonight numbness in his chest also

## 2023-06-01 NOTE — Consult Note (Signed)
Initial Consultation Note   Patient: James Fletcher OAC:[id number] DOB: [birth year] PCP: Donita Brooks, MD DOA: 06/01/2023 DOS: the patient was seen and examined on 06/01/2023   Referring physician: Laurey Arrow Reason for consult: Admission  Ultimately patient left AGAINST MEDICAL ADVICE after he was advised on the reasons admission was recommended as noted below. Assessment and Plan:   Chest pain Patient had reported having numbness substernally this morning.  High-sensitivity troponins were negative x 2.  Chest x-ray showed no acute abnormality.  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  On physical exam patient with at least 2+ pitting bilateral lower extremity edema.  BNP was elevated at 472.1.  Last echocardiogram noted EF to be 45 to 50% with diffuse hypokinesis and mildly dilated left atrium back in 02/2012. -Checking TSH and echocardiogram was recommended -Lasix IV was discussed with the patient in regards to concern for exacerbation of heart failure as the cause of his symptoms   Bradycardia Patient noted to be bradycardic at 52 bpm with stable blood pressures.  Unclear if patient was on any rate controlling medications -Patient recommended for monitoring   Essential hypertension Blood pressures noted to be elevated up to 182/96. -Recommended resume home blood pressure regimen. -Hydralazine IV as needed for elevated blood pressures   Hypochromic anemia Acute.  Hemoglobin 9 g/dL with MCH mildly low at 25.6 and RDW at the upper limit of normal.  No reports of bleeding.  Suspect secondary to iron deficiency previous studies noted low iron levels back in 2020. -Was recommended to check iron studies  Hyperlipidemia Last lipid panel obtained 07/07/2022 noted total cholesterol 146, HDL 52, LDL  71, and triglycerides 152.  Current medications appear to include atorvastatin 80 mg daily -Lipid panel was recommended    Chronic back pain Peripheral neuropathy -Patient to keep outpatient follow-up appointments as previously scheduled.   GERD    TRH will sign off at present, please call us again when needed.  HPI: James Fletcher is a 77 y.o. male with past medical history of HfrEF, LBBB, lumbar spinal stenosis, chronic back pain, neuropathy, and agent orange exposure who presented with complaints of numbness in his chest.  Patient has issues due to having severe pain in his back which he has a doctor's appointment today to discuss his back.  It is not uncommon for him to be unable to sleep at night, but this morning also reported having a numbness feeling substernally.  Over the last few days he had experiencing some shortness of breath with exertion as well as intermittent nausea.  He chronically has lower extremity swelling that is usually worse in his left leg than in his right, but does not note symptoms have changed.  Denies having any recent fever, cough, abdominal pain, or recent sick contacts to his knowledge.  All of his care is received from the [facility] where he would like to continue his care.  He reports that the medication I gave him today did help with his back pain, but did have nausea and vomiting after receiving the medication.  In the emergency department patient was noted to be afebrile with respirations 12-22, pulse 57-69, blood pressures elevated up to 182/96, O2 saturations maintained on room air.  Labs significant for hemoglobin 9, sodium 130, calcium 8.5, D-dimer <0.27, HS- troponin  negative x 2, and BNP 472.1.  Chest x-ray showed no acute abnormality.  Patient had been given full dose aspirin, hydrocodone, Dilaudid 0.5 mg IV, and Zofran.  Orders have been placed to obtain a CT angiogram of the chest, abdomen, and pelvis.  TRH was consulted to evaluate for admission,  but patient reported that he wanted to be discharged.  Review of Systems: As mentioned in the history of present illness. All other systems reviewed and are negative. Past Medical History:  Diagnosis Date   Cholecystitis    Left bundle-branch block 02/26/2012   Neuropathy 02/26/2012   Peripheral neuropathy    Past Surgical History:  Procedure Laterality Date   APPENDECTOMY     CHOLECYSTECTOMY N/A 05/01/2014   Procedure: LAPAROSCOPIC CHOLECYSTECTOMY;  Surgeon: Liz Malady, MD;  Location: [facility] OR;  Service: General;  Laterality: N/A;   HERNIA REPAIR     Social History:  reports that he has been smoking cigarettes. He has a 30 pack-year smoking history. He has never used smokeless tobacco. He reports that he does not drink alcohol and does not use drugs.  No Known Allergies  History reviewed. No pertinent family history.  Prior to Admission medications   Medication Sig Start Date End Date Taking? Authorizing Provider  gabapentin (NEURONTIN) 400 MG capsule Take 1,200 mg by mouth 3 (three) times daily.    Yes [provider]  HYDROcodone-acetaminophen (NORCO/VICODIN) 5-325 MG tablet Take 1 tablet by mouth every 6 (six) hours as needed for moderate pain. 60 must last 30 days 10/27/22  Yes Pickard, Priscille Heidelberg, MD  methocarbamol (ROBAXIN) 500 MG tablet TAKE TWO TABLETS BY MOUTH TWO TIMES A DAY AS NEEDED FOR MUSCLE SPASMS 07/10/21  Yes [provider]  aspirin 81 MG tablet Take 81 mg by mouth daily.    [provider]  atorvastatin (LIPITOR) 80 MG tablet Take 80 mg by mouth daily.    [provider]  carbamazepine (TEGRETOL) 200 MG tablet TAKE TWO TABLETS BY MOUTH TWO TIMES A DAY 07/22/21   [provider]  diazepam (VALIUM) 2 MG tablet Take 2 mg by mouth every 6 (six) hours as needed for anxiety.    [provider]  finasteride (PROSCAR) 5 MG tablet TAKE ONE TABLET BY MOUTH EVERY DAY FOR PROSTATE. TAKE WITH TAMSULOSIN. 05/01/21   [provider]  fluticasone (FLONASE) 50 MCG/ACT nasal spray INSTILL 2 SPRAYS INTO EACH NOSTRIL EVERY DAY ALLERGIES 07/25/21   [provider]  hydrALAZINE (APRESOLINE) 50 MG tablet Take 50 mg by mouth 3 (three) times daily.    [provider]  Multiple Vitamins-Minerals (MULTIVITAMIN WITH MINERALS) tablet Take 1 tablet by mouth daily.    [provider]  pantoprazole (PROTONIX) 40 MG tablet Take 1 tablet (40 mg total) by mouth daily. 04/24/20   Donita Brooks, MD  sodium chloride (OCEAN) 0.65 % SOLN nasal spray Place 1 spray into both nostrils as needed for congestion. 04/01/21   Valentino Nose, NP  tamsulosin (FLOMAX) 0.4 MG CAPS capsule Take 0.4 mg by mouth.    [provider]  valsartan (DIOVAN) 160 MG tablet TAKE 1 TABLET BY MOUTH ONCE DAILY .  THIS  REPLACES  LOSARTAN 07/02/22   Donita Brooks, MD  VITAMIN D, CHOLECALCIFEROL, PO Take 1 tablet by mouth daily.    [provider]  zinc gluconate 50 MG tablet Take 50 mg by mouth daily.    [provider]    Physical Exam: Vitals:  06/01/23 0500 06/01/23 0530 06/01/23 0700 06/01/23 0725  BP: (!) 179/130 (!) 172/111 (!) 162/111 (!) 175/75  Pulse: 69 65 (!) 57 (!) 59  Resp: 20 18 13 14   Temp:      TempSrc:      SpO2: 96% 97% 94% 95%  Weight:      Height:      Constitutional: Elderly male sitting in chair. Eyes: PERRL, lids and conjunctivae normal ENMT: Mucous membranes are moist.   Neck: normal, supple, no JVD appreciated. Respiratory: clear to auscultation bilaterally, no wheezing, no crackles. Normal respiratory effort. No accessory muscle use.  Cardiovascular: Regular rate and rhythm, no murmurs / rubs / gallops.  2+ pitting bilateral lower extremity edema present. Abdomen: no tenderness, no masses palpated. No hepatosplenomegaly. Bowel sounds positive.  Musculoskeletal: no clubbing / cyanosis. No joint deformity upper and lower extremities. Good ROM, no contractures.  Normal muscle tone.  Skin: no rashes, lesions, ulcers. No induration Neurologic: CN 2-12 grossly intact. Sensation intact, DTR normal. Strength 5/5 in all 4.  Psychiatric: Normal judgment and insight. Alert and oriented x 3. Normal mood.   Data Reviewed:   EKG revealed sinus bradycardia at 52 bpm with first-degree AV block, intraventricular conduction block,  LVH, and QTc 498.    Family Communication: This plan with wife present at bedside Primary team communication:  Thank you very much for involving [place] in the care of your patient.  Author: Clydie Braun, MD 06/01/2023 9:16 AM  For on call review [email or web address].

## 2023-06-01 NOTE — ED Provider Notes (Signed)
EMERGENCY DEPARTMENT AT [facility] Provider Note   CSN: [id number] Arrival date & time: 06/01/23  0154     History  Chief Complaint  Patient presents with   Shortness of Breath    James Fletcher is a 77 y.o. male.  Patient with a history of lumbar spinal stenosis, chronic back pain, neuropathy, presents with chest "numbness", shortness of breath, nausea intermittent for the past 2 days.  He describes a sensation in his chest as a "prickly pear feeling".  That comes and goes lasting for several minutes at a time.  No radiation of the pain to his arm, neck or back.  Some associated shortness of breath which is worse with exertion and climbing stairs.  Has had intermittent nausea over the past several days as well but comes and goes does not seem to correlate with the chest feeling or shortness of breath.  He denies any cardiac history.  He does not think he is ever had a stress test.  He gets most of his care at the [place].  Denies any abdominal pain.  Denies any change to his chronic back pain.  No cough or fever.  No pain with urination or blood in the urine.   No change in his chronic back pain.  He does not know what he takes at home for pain.  He does not have any chest pain or shortness of breath currently but states he has noticed an increase shortness of breath for past few days when he climbs the stairs of his home.  The history is provided by the patient and the spouse.  Shortness of Breath Associated symptoms: chest pain   Associated symptoms: no abdominal pain, no fever, no headaches, no rash and no vomiting        Home Medications Prior to Admission medications   Medication Sig Start Date End Date Taking? Authorizing Provider  aspirin 81 MG tablet Take 81 mg by mouth daily.    [provider]  atorvastatin (LIPITOR) 80 MG tablet Take 80 mg by mouth daily.    [provider]  carbamazepine (TEGRETOL) 200 MG tablet TAKE TWO TABLETS BY  MOUTH TWO TIMES A DAY 07/22/21   [provider]  diazepam (VALIUM) 2 MG tablet Take 2 mg by mouth every 6 (six) hours as needed for anxiety.    [provider]  finasteride (PROSCAR) 5 MG tablet TAKE ONE TABLET BY MOUTH EVERY DAY FOR PROSTATE. TAKE WITH TAMSULOSIN. 05/01/21   [provider]  fluticasone (FLONASE) 50 MCG/ACT nasal spray INSTILL 2 SPRAYS INTO EACH NOSTRIL EVERY DAY ALLERGIES 07/25/21   [provider]  gabapentin (NEURONTIN) 400 MG capsule Take 1,200 mg by mouth 3 (three) times daily.     [provider]  hydrALAZINE (APRESOLINE) 50 MG tablet Take 50 mg by mouth 3 (three) times daily.    [provider]  HYDROcodone-acetaminophen (NORCO/VICODIN) 5-325 MG tablet Take 1 tablet by mouth every 6 (six) hours as needed for moderate pain. 60 must last 30 days 10/27/22   Donita Brooks, MD  methocarbamol (ROBAXIN) 500 MG tablet TAKE TWO TABLETS BY MOUTH TWO TIMES A DAY AS NEEDED FOR MUSCLE SPASMS 07/10/21   [provider]  Multiple Vitamins-Minerals (MULTIVITAMIN WITH MINERALS) tablet Take 1 tablet by mouth daily.    [provider]  pantoprazole (PROTONIX) 40 MG tablet Take 1 tablet (40 mg total) by mouth daily. 04/24/20   Donita Brooks, MD  sodium  chloride (OCEAN) 0.65 % SOLN nasal spray Place 1 spray into both nostrils as needed for congestion. 04/01/21   Valentino Nose, NP  tamsulosin (FLOMAX) 0.4 MG CAPS capsule Take 0.4 mg by mouth.    [provider]  valsartan (DIOVAN) 160 MG tablet TAKE 1 TABLET BY MOUTH ONCE DAILY .  THIS  REPLACES  LOSARTAN 07/02/22   Donita Brooks, MD  VITAMIN D, CHOLECALCIFEROL, PO Take 1 tablet by mouth daily.    [provider]  zinc gluconate 50 MG tablet Take 50 mg by mouth daily.    [provider]      Allergies    Patient has no known allergies.    Review of Systems   Review of Systems  Constitutional:  Negative for activity change,  appetite change and fever.  HENT:  Negative for congestion and rhinorrhea.   Respiratory:  Positive for chest tightness and shortness of breath.   Cardiovascular:  Positive for chest pain.  Gastrointestinal:  Positive for nausea. Negative for abdominal pain, rectal pain and vomiting.  Genitourinary:  Negative for dysuria and hematuria.  Musculoskeletal:  Negative for arthralgias and myalgias.  Skin:  Negative for rash.  Neurological:  Negative for dizziness, weakness and headaches.   all other systems are negative except as noted in the HPI and PMH.    Physical Exam Updated Vital Signs BP (!) 175/75 (BP Location: Right Arm)   Pulse 61   Temp 97.6 F (36.4 C) (Oral)   Resp 12   Ht 5\' 10"  (1.778 m)   Wt 96.2 kg   SpO2 99%   BMI 30.43 kg/m  Physical Exam Vitals and nursing note reviewed.  Constitutional:      General: He is not in acute distress.    Appearance: He is well-developed. He is not ill-appearing.  HENT:     Head: Normocephalic and atraumatic.     Mouth/Throat:     Pharynx: No oropharyngeal exudate.  Eyes:     Conjunctiva/sclera: Conjunctivae normal.     Pupils: Pupils are equal, round, and reactive to light.  Neck:     Comments: No meningismus. Cardiovascular:     Rate and Rhythm: Normal rate and regular rhythm.     Heart sounds: Normal heart sounds. No murmur heard. Pulmonary:     Effort: Pulmonary effort is normal. No respiratory distress.     Breath sounds: Normal breath sounds.  Chest:     Chest wall: No tenderness.  Abdominal:     Palpations: Abdomen is soft.     Tenderness: There is no abdominal tenderness. There is no guarding or rebound.  Musculoskeletal:        General: No tenderness. Normal range of motion.     Cervical back: Normal range of motion and neck supple.     Right lower leg: No edema.     Left lower leg: No edema.  Skin:    General: Skin is warm.  Neurological:     Mental Status: He is alert and oriented to person, place, and time.      Cranial Nerves: No cranial nerve deficit.     Motor: No abnormal muscle tone.     Coordination: Coordination normal.     Comments:  5/5 strength throughout. CN 2-12 intact.Equal grip strength.   Psychiatric:        Behavior: Behavior normal.     ED Results / Procedures / Treatments   Labs (all labs ordered are listed, but only abnormal  results are displayed) Labs Reviewed  BASIC METABOLIC PANEL - Abnormal; Notable for the following components:      Result Value   Sodium 130 (*)    CO2 21 (*)    Calcium 8.5 (*)    All other components within normal limits  CBC - Abnormal; Notable for the following components:   RBC 3.52 (*)    Hemoglobin 9.0 (*)    HCT 29.7 (*)    MCH 25.6 (*)    All other components within normal limits  BRAIN NATRIURETIC PEPTIDE - Abnormal; Notable for the following components:   B Natriuretic Peptide 472.1 (*)    All other components within normal limits  D-DIMER, QUANTITATIVE  TSH  IRON AND TIBC  FERRITIN  LIPID PANEL  TROPONIN I (HIGH SENSITIVITY)  TROPONIN I (HIGH SENSITIVITY)    EKG EKG Interpretation Date/Time:  Monday June 01 2023 01:50:21 EDT Ventricular Rate:  52 PR Interval:  260 QRS Duration:  166 QT Interval:  536 QTC Calculation: 498 R Axis:   -42  Text Interpretation: Sinus bradycardia with 1st degree A-V block Left axis deviation Non-specific intra-ventricular conduction block Left ventricular hypertrophy with repolarization abnormality ( R in aVL , Cornell product , Romhilt-Estes ) Inferior infarct , age undetermined Abnormal ECG Compared to previous EKG, rate slower No STEMI, negative Sgarbossa's Confirmed by Ernie Avena ([id number]) on 06/01/2023 2:08:22 AM  Radiology DG Chest 2 View  Result Date: 06/01/2023 CLINICAL DATA:  Shortness of breath. EXAM: CHEST - 2 VIEW COMPARISON:  June 14, 2020 FINDINGS: The heart size and mediastinal contours are within normal limits. There is moderate severity calcification of the aortic  arch. Low lung volumes are noted with mild elevation of the right hemidiaphragm. There is no evidence of an acute infiltrate, pleural effusion or pneumothorax. Multilevel degenerative changes seen throughout the thoracic spine. IMPRESSION: No active cardiopulmonary disease. Electronically Signed   By: Aram Candela M.D.   On: 06/01/2023 03:14    Procedures Procedures    Medications Ordered in ED Medications  aspirin chewable tablet 324 mg (has no administration in time range)  HYDROcodone-acetaminophen (NORCO/VICODIN) 5-325 MG per tablet 1 tablet (has no administration in time range)    ED Course/ Medical Decision Making/ A&P                                 Medical Decision Making Amount and/or Complexity of Data Reviewed Labs: ordered. Decision-making details documented in ED Course. Radiology: ordered and independent interpretation performed. Decision-making details documented in ED Course. ECG/medicine tests: ordered and independent interpretation performed. Decision-making details documented in ED Course.  Risk OTC drugs. Prescription drug management.   Patient with intermittent nausea, prickling feeling to his chest and shortness of breath for the past several days.  EKG is LVH and right bundle branch block without acute ST changes  Patient remains chest pain-free.  No hypoxia.  Chest x-ray is clear.  Lungs are clear to auscultation.  Heart score is 5.  He does have chronic back pain which is unchanged.  Low suspicion for significant vascular pathology such as aortic aneurysm or dissection.  Low suspicion for PE with negative D-dimer.  troponin negative.  With intermittent chest tightness and tingling with shortness of breath and nausea will plan observation admission for cardiac rule out and likely stress test.  Discussed with Dr. Joneen Roach.        Final Clinical Impression(s) / ED Diagnoses  Final diagnoses:  Chest pain, unspecified type  DOE (dyspnea on  exertion)    Rx / DC Orders ED Discharge Orders     None         Morris Markham, Jeannett Senior, MD 06/01/23 [phone]

## 2023-06-01 NOTE — ED Notes (Signed)
Patient arrive to room from lobby at this time. No VS documented prior to patients arrival to room.

## 2023-06-01 NOTE — Discharge Instructions (Addendum)
Return to emergency department immediately if you decide to be admitted for further workup of your chest pain

## 2023-07-10 DIAGNOSIS — I517 Cardiomegaly: Secondary | ICD-10-CM | POA: Diagnosis not present

## 2023-07-10 DIAGNOSIS — S2509XA Other specified injury of thoracic aorta, initial encounter: Secondary | ICD-10-CM | POA: Diagnosis not present

## 2023-07-10 DIAGNOSIS — G4700 Insomnia, unspecified: Secondary | ICD-10-CM | POA: Diagnosis not present

## 2023-07-10 DIAGNOSIS — J449 Chronic obstructive pulmonary disease, unspecified: Secondary | ICD-10-CM | POA: Diagnosis not present

## 2023-07-10 DIAGNOSIS — G8929 Other chronic pain: Secondary | ICD-10-CM | POA: Diagnosis not present

## 2023-07-10 DIAGNOSIS — K219 Gastro-esophageal reflux disease without esophagitis: Secondary | ICD-10-CM | POA: Diagnosis not present

## 2023-07-10 DIAGNOSIS — Z79899 Other long term (current) drug therapy: Secondary | ICD-10-CM | POA: Diagnosis not present

## 2023-07-10 DIAGNOSIS — S2242XA Multiple fractures of ribs, left side, initial encounter for closed fracture: Secondary | ICD-10-CM | POA: Diagnosis not present

## 2023-07-10 DIAGNOSIS — R162 Hepatomegaly with splenomegaly, not elsewhere classified: Secondary | ICD-10-CM | POA: Diagnosis not present

## 2023-07-10 DIAGNOSIS — K5900 Constipation, unspecified: Secondary | ICD-10-CM | POA: Diagnosis not present

## 2023-07-10 DIAGNOSIS — I71019 Dissection of thoracic aorta, unspecified: Secondary | ICD-10-CM | POA: Diagnosis not present

## 2023-07-10 DIAGNOSIS — S2232XA Fracture of one rib, left side, initial encounter for closed fracture: Secondary | ICD-10-CM | POA: Diagnosis not present

## 2023-07-10 DIAGNOSIS — E871 Hypo-osmolality and hyponatremia: Secondary | ICD-10-CM | POA: Diagnosis not present

## 2023-07-10 DIAGNOSIS — S0990XA Unspecified injury of head, initial encounter: Secondary | ICD-10-CM | POA: Diagnosis not present

## 2023-07-10 DIAGNOSIS — I7123 Aneurysm of the descending thoracic aorta, without rupture: Secondary | ICD-10-CM | POA: Diagnosis not present

## 2023-07-10 DIAGNOSIS — G629 Polyneuropathy, unspecified: Secondary | ICD-10-CM | POA: Diagnosis not present

## 2023-07-10 DIAGNOSIS — F909 Attention-deficit hyperactivity disorder, unspecified type: Secondary | ICD-10-CM | POA: Diagnosis not present

## 2023-07-10 DIAGNOSIS — G4710 Hypersomnia, unspecified: Secondary | ICD-10-CM | POA: Diagnosis not present

## 2023-07-10 DIAGNOSIS — F419 Anxiety disorder, unspecified: Secondary | ICD-10-CM | POA: Diagnosis not present

## 2023-07-10 DIAGNOSIS — J90 Pleural effusion, not elsewhere classified: Secondary | ICD-10-CM | POA: Diagnosis not present

## 2023-07-10 DIAGNOSIS — I3139 Other pericardial effusion (noninflammatory): Secondary | ICD-10-CM | POA: Diagnosis not present

## 2023-07-10 DIAGNOSIS — R001 Bradycardia, unspecified: Secondary | ICD-10-CM | POA: Diagnosis not present

## 2023-07-10 DIAGNOSIS — G8918 Other acute postprocedural pain: Secondary | ICD-10-CM | POA: Diagnosis not present

## 2023-07-10 DIAGNOSIS — E785 Hyperlipidemia, unspecified: Secondary | ICD-10-CM | POA: Diagnosis not present

## 2023-07-10 DIAGNOSIS — I447 Left bundle-branch block, unspecified: Secondary | ICD-10-CM | POA: Diagnosis not present

## 2023-07-10 DIAGNOSIS — W1830XA Fall on same level, unspecified, initial encounter: Secondary | ICD-10-CM | POA: Diagnosis not present

## 2023-07-10 DIAGNOSIS — I71011 Dissection of aortic arch: Secondary | ICD-10-CM | POA: Diagnosis not present

## 2023-07-10 DIAGNOSIS — I7100 Dissection of unspecified site of aorta: Secondary | ICD-10-CM | POA: Diagnosis not present

## 2023-07-10 DIAGNOSIS — I499 Cardiac arrhythmia, unspecified: Secondary | ICD-10-CM | POA: Diagnosis not present

## 2023-07-10 DIAGNOSIS — S32018A Other fracture of first lumbar vertebra, initial encounter for closed fracture: Secondary | ICD-10-CM | POA: Diagnosis not present

## 2023-07-10 DIAGNOSIS — R410 Disorientation, unspecified: Secondary | ICD-10-CM | POA: Diagnosis not present

## 2023-07-10 DIAGNOSIS — W19XXXA Unspecified fall, initial encounter: Secondary | ICD-10-CM | POA: Diagnosis not present

## 2023-07-10 DIAGNOSIS — M4802 Spinal stenosis, cervical region: Secondary | ICD-10-CM | POA: Diagnosis not present

## 2023-07-10 DIAGNOSIS — R918 Other nonspecific abnormal finding of lung field: Secondary | ICD-10-CM | POA: Diagnosis not present

## 2023-07-10 DIAGNOSIS — G8911 Acute pain due to trauma: Secondary | ICD-10-CM | POA: Diagnosis not present

## 2023-07-10 DIAGNOSIS — I440 Atrioventricular block, first degree: Secondary | ICD-10-CM | POA: Diagnosis not present

## 2023-07-10 DIAGNOSIS — E559 Vitamin D deficiency, unspecified: Secondary | ICD-10-CM | POA: Diagnosis not present

## 2023-07-10 DIAGNOSIS — D62 Acute posthemorrhagic anemia: Secondary | ICD-10-CM | POA: Diagnosis not present

## 2023-07-10 DIAGNOSIS — I10 Essential (primary) hypertension: Secondary | ICD-10-CM | POA: Diagnosis not present

## 2023-07-10 DIAGNOSIS — Z7409 Other reduced mobility: Secondary | ICD-10-CM | POA: Diagnosis not present

## 2023-07-10 DIAGNOSIS — M47812 Spondylosis without myelopathy or radiculopathy, cervical region: Secondary | ICD-10-CM | POA: Diagnosis not present

## 2023-07-10 DIAGNOSIS — G2581 Restless legs syndrome: Secondary | ICD-10-CM | POA: Diagnosis not present

## 2023-07-10 DIAGNOSIS — D509 Iron deficiency anemia, unspecified: Secondary | ICD-10-CM | POA: Diagnosis not present

## 2023-07-10 DIAGNOSIS — R2681 Unsteadiness on feet: Secondary | ICD-10-CM | POA: Diagnosis not present

## 2023-07-10 DIAGNOSIS — I82612 Acute embolism and thrombosis of superficial veins of left upper extremity: Secondary | ICD-10-CM | POA: Diagnosis not present

## 2023-07-10 DIAGNOSIS — M542 Cervicalgia: Secondary | ICD-10-CM | POA: Diagnosis not present

## 2023-07-10 DIAGNOSIS — S2249XA Multiple fractures of ribs, unspecified side, initial encounter for closed fracture: Secondary | ICD-10-CM | POA: Diagnosis not present

## 2023-07-10 DIAGNOSIS — Y929 Unspecified place or not applicable: Secondary | ICD-10-CM | POA: Diagnosis not present

## 2023-07-10 DIAGNOSIS — I71012 Dissection of descending thoracic aorta: Secondary | ICD-10-CM | POA: Diagnosis not present

## 2023-07-10 DIAGNOSIS — S199XXA Unspecified injury of neck, initial encounter: Secondary | ICD-10-CM | POA: Diagnosis not present

## 2023-07-10 DIAGNOSIS — J9811 Atelectasis: Secondary | ICD-10-CM | POA: Diagnosis not present

## 2023-07-10 DIAGNOSIS — F4310 Post-traumatic stress disorder, unspecified: Secondary | ICD-10-CM | POA: Diagnosis not present

## 2023-07-10 DIAGNOSIS — M545 Low back pain, unspecified: Secondary | ICD-10-CM | POA: Diagnosis not present

## 2023-07-10 DIAGNOSIS — N400 Enlarged prostate without lower urinary tract symptoms: Secondary | ICD-10-CM | POA: Diagnosis not present

## 2023-07-10 DIAGNOSIS — M48061 Spinal stenosis, lumbar region without neurogenic claudication: Secondary | ICD-10-CM | POA: Diagnosis not present

## 2023-07-10 DIAGNOSIS — F32A Depression, unspecified: Secondary | ICD-10-CM | POA: Diagnosis not present

## 2023-07-10 DIAGNOSIS — J439 Emphysema, unspecified: Secondary | ICD-10-CM | POA: Diagnosis not present

## 2023-07-10 DIAGNOSIS — R1312 Dysphagia, oropharyngeal phase: Secondary | ICD-10-CM | POA: Diagnosis not present

## 2023-07-10 DIAGNOSIS — W1839XA Other fall on same level, initial encounter: Secondary | ICD-10-CM | POA: Diagnosis not present

## 2023-07-10 DIAGNOSIS — S32019A Unspecified fracture of first lumbar vertebra, initial encounter for closed fracture: Secondary | ICD-10-CM | POA: Diagnosis not present

## 2023-07-10 DIAGNOSIS — R911 Solitary pulmonary nodule: Secondary | ICD-10-CM | POA: Diagnosis not present

## 2023-07-10 DIAGNOSIS — K769 Liver disease, unspecified: Secondary | ICD-10-CM | POA: Diagnosis not present

## 2023-07-10 DIAGNOSIS — M810 Age-related osteoporosis without current pathological fracture: Secondary | ICD-10-CM | POA: Diagnosis not present

## 2023-07-11 DIAGNOSIS — S2249XA Multiple fractures of ribs, unspecified side, initial encounter for closed fracture: Secondary | ICD-10-CM | POA: Diagnosis not present

## 2023-07-11 DIAGNOSIS — R918 Other nonspecific abnormal finding of lung field: Secondary | ICD-10-CM | POA: Diagnosis not present

## 2023-07-11 DIAGNOSIS — W19XXXA Unspecified fall, initial encounter: Secondary | ICD-10-CM | POA: Diagnosis not present

## 2023-07-11 DIAGNOSIS — S2242XA Multiple fractures of ribs, left side, initial encounter for closed fracture: Secondary | ICD-10-CM | POA: Diagnosis not present

## 2023-07-11 DIAGNOSIS — J9811 Atelectasis: Secondary | ICD-10-CM | POA: Diagnosis not present

## 2023-07-11 DIAGNOSIS — G8911 Acute pain due to trauma: Secondary | ICD-10-CM | POA: Diagnosis not present

## 2023-07-11 DIAGNOSIS — E785 Hyperlipidemia, unspecified: Secondary | ICD-10-CM | POA: Diagnosis not present

## 2023-07-11 DIAGNOSIS — I10 Essential (primary) hypertension: Secondary | ICD-10-CM | POA: Diagnosis not present

## 2023-07-12 DIAGNOSIS — I7100 Dissection of unspecified site of aorta: Secondary | ICD-10-CM | POA: Diagnosis not present

## 2023-07-12 DIAGNOSIS — W19XXXA Unspecified fall, initial encounter: Secondary | ICD-10-CM | POA: Diagnosis not present

## 2023-07-12 DIAGNOSIS — S2242XA Multiple fractures of ribs, left side, initial encounter for closed fracture: Secondary | ICD-10-CM | POA: Diagnosis not present

## 2023-07-12 DIAGNOSIS — J9811 Atelectasis: Secondary | ICD-10-CM | POA: Diagnosis not present

## 2023-07-12 DIAGNOSIS — I3139 Other pericardial effusion (noninflammatory): Secondary | ICD-10-CM | POA: Diagnosis not present

## 2023-07-12 DIAGNOSIS — W1839XA Other fall on same level, initial encounter: Secondary | ICD-10-CM | POA: Diagnosis not present

## 2023-07-12 DIAGNOSIS — S32019A Unspecified fracture of first lumbar vertebra, initial encounter for closed fracture: Secondary | ICD-10-CM | POA: Diagnosis not present

## 2023-07-12 DIAGNOSIS — I10 Essential (primary) hypertension: Secondary | ICD-10-CM | POA: Diagnosis not present

## 2023-07-12 DIAGNOSIS — M542 Cervicalgia: Secondary | ICD-10-CM | POA: Diagnosis not present

## 2023-07-12 DIAGNOSIS — E785 Hyperlipidemia, unspecified: Secondary | ICD-10-CM | POA: Diagnosis not present

## 2023-07-12 DIAGNOSIS — R918 Other nonspecific abnormal finding of lung field: Secondary | ICD-10-CM | POA: Diagnosis not present

## 2023-07-12 DIAGNOSIS — S2249XA Multiple fractures of ribs, unspecified side, initial encounter for closed fracture: Secondary | ICD-10-CM | POA: Diagnosis not present

## 2023-07-13 DIAGNOSIS — J9811 Atelectasis: Secondary | ICD-10-CM | POA: Diagnosis not present

## 2023-07-13 DIAGNOSIS — R918 Other nonspecific abnormal finding of lung field: Secondary | ICD-10-CM | POA: Diagnosis not present

## 2023-07-13 DIAGNOSIS — S2242XA Multiple fractures of ribs, left side, initial encounter for closed fracture: Secondary | ICD-10-CM | POA: Diagnosis not present

## 2023-07-13 DIAGNOSIS — R1312 Dysphagia, oropharyngeal phase: Secondary | ICD-10-CM | POA: Diagnosis not present

## 2023-07-13 DIAGNOSIS — W19XXXA Unspecified fall, initial encounter: Secondary | ICD-10-CM | POA: Diagnosis not present

## 2023-07-13 DIAGNOSIS — E871 Hypo-osmolality and hyponatremia: Secondary | ICD-10-CM | POA: Diagnosis not present

## 2023-07-13 DIAGNOSIS — I517 Cardiomegaly: Secondary | ICD-10-CM | POA: Diagnosis not present

## 2023-07-13 DIAGNOSIS — I3139 Other pericardial effusion (noninflammatory): Secondary | ICD-10-CM | POA: Diagnosis not present

## 2023-07-13 DIAGNOSIS — G8911 Acute pain due to trauma: Secondary | ICD-10-CM | POA: Diagnosis not present

## 2023-07-13 DIAGNOSIS — S2249XA Multiple fractures of ribs, unspecified side, initial encounter for closed fracture: Secondary | ICD-10-CM | POA: Diagnosis not present

## 2023-07-13 DIAGNOSIS — J90 Pleural effusion, not elsewhere classified: Secondary | ICD-10-CM | POA: Diagnosis not present

## 2023-07-13 DIAGNOSIS — I7123 Aneurysm of the descending thoracic aorta, without rupture: Secondary | ICD-10-CM | POA: Diagnosis not present

## 2023-07-13 DIAGNOSIS — J449 Chronic obstructive pulmonary disease, unspecified: Secondary | ICD-10-CM | POA: Diagnosis not present

## 2023-07-14 DIAGNOSIS — Z79899 Other long term (current) drug therapy: Secondary | ICD-10-CM | POA: Diagnosis not present

## 2023-07-14 DIAGNOSIS — S2249XA Multiple fractures of ribs, unspecified side, initial encounter for closed fracture: Secondary | ICD-10-CM | POA: Diagnosis not present

## 2023-07-14 DIAGNOSIS — R1312 Dysphagia, oropharyngeal phase: Secondary | ICD-10-CM | POA: Diagnosis not present

## 2023-07-14 DIAGNOSIS — I3139 Other pericardial effusion (noninflammatory): Secondary | ICD-10-CM | POA: Diagnosis not present

## 2023-07-14 DIAGNOSIS — G8911 Acute pain due to trauma: Secondary | ICD-10-CM | POA: Diagnosis not present

## 2023-07-14 DIAGNOSIS — I447 Left bundle-branch block, unspecified: Secondary | ICD-10-CM | POA: Diagnosis not present

## 2023-07-14 DIAGNOSIS — J449 Chronic obstructive pulmonary disease, unspecified: Secondary | ICD-10-CM | POA: Diagnosis not present

## 2023-07-14 DIAGNOSIS — R918 Other nonspecific abnormal finding of lung field: Secondary | ICD-10-CM | POA: Diagnosis not present

## 2023-07-14 DIAGNOSIS — R001 Bradycardia, unspecified: Secondary | ICD-10-CM | POA: Diagnosis not present

## 2023-07-14 DIAGNOSIS — J9811 Atelectasis: Secondary | ICD-10-CM | POA: Diagnosis not present

## 2023-07-14 DIAGNOSIS — R410 Disorientation, unspecified: Secondary | ICD-10-CM | POA: Diagnosis not present

## 2023-07-14 DIAGNOSIS — I440 Atrioventricular block, first degree: Secondary | ICD-10-CM | POA: Diagnosis not present

## 2023-07-14 DIAGNOSIS — I7123 Aneurysm of the descending thoracic aorta, without rupture: Secondary | ICD-10-CM | POA: Diagnosis not present

## 2023-07-14 DIAGNOSIS — W19XXXA Unspecified fall, initial encounter: Secondary | ICD-10-CM | POA: Diagnosis not present

## 2023-07-14 DIAGNOSIS — S2242XA Multiple fractures of ribs, left side, initial encounter for closed fracture: Secondary | ICD-10-CM | POA: Diagnosis not present

## 2023-07-14 DIAGNOSIS — E871 Hypo-osmolality and hyponatremia: Secondary | ICD-10-CM | POA: Diagnosis not present

## 2023-07-15 DIAGNOSIS — Z79899 Other long term (current) drug therapy: Secondary | ICD-10-CM | POA: Diagnosis not present

## 2023-07-15 DIAGNOSIS — S2242XA Multiple fractures of ribs, left side, initial encounter for closed fracture: Secondary | ICD-10-CM | POA: Diagnosis not present

## 2023-07-15 DIAGNOSIS — R918 Other nonspecific abnormal finding of lung field: Secondary | ICD-10-CM | POA: Diagnosis not present

## 2023-07-15 DIAGNOSIS — E871 Hypo-osmolality and hyponatremia: Secondary | ICD-10-CM | POA: Diagnosis not present

## 2023-07-15 DIAGNOSIS — G8911 Acute pain due to trauma: Secondary | ICD-10-CM | POA: Diagnosis not present

## 2023-07-15 DIAGNOSIS — S2249XA Multiple fractures of ribs, unspecified side, initial encounter for closed fracture: Secondary | ICD-10-CM | POA: Diagnosis not present

## 2023-07-15 DIAGNOSIS — J449 Chronic obstructive pulmonary disease, unspecified: Secondary | ICD-10-CM | POA: Diagnosis not present

## 2023-07-15 DIAGNOSIS — I7123 Aneurysm of the descending thoracic aorta, without rupture: Secondary | ICD-10-CM | POA: Diagnosis not present

## 2023-07-15 DIAGNOSIS — R410 Disorientation, unspecified: Secondary | ICD-10-CM | POA: Diagnosis not present

## 2023-07-15 DIAGNOSIS — I3139 Other pericardial effusion (noninflammatory): Secondary | ICD-10-CM | POA: Diagnosis not present

## 2023-07-15 DIAGNOSIS — I82612 Acute embolism and thrombosis of superficial veins of left upper extremity: Secondary | ICD-10-CM | POA: Diagnosis not present

## 2023-07-15 DIAGNOSIS — W19XXXA Unspecified fall, initial encounter: Secondary | ICD-10-CM | POA: Diagnosis not present

## 2023-07-15 DIAGNOSIS — R1312 Dysphagia, oropharyngeal phase: Secondary | ICD-10-CM | POA: Diagnosis not present

## 2023-07-16 DIAGNOSIS — J9811 Atelectasis: Secondary | ICD-10-CM | POA: Diagnosis not present

## 2023-07-16 DIAGNOSIS — J90 Pleural effusion, not elsewhere classified: Secondary | ICD-10-CM | POA: Diagnosis not present

## 2023-07-16 DIAGNOSIS — R918 Other nonspecific abnormal finding of lung field: Secondary | ICD-10-CM | POA: Diagnosis not present

## 2023-07-16 DIAGNOSIS — J449 Chronic obstructive pulmonary disease, unspecified: Secondary | ICD-10-CM | POA: Diagnosis not present

## 2023-07-16 DIAGNOSIS — R001 Bradycardia, unspecified: Secondary | ICD-10-CM | POA: Diagnosis not present

## 2023-07-16 DIAGNOSIS — R1312 Dysphagia, oropharyngeal phase: Secondary | ICD-10-CM | POA: Diagnosis not present

## 2023-07-16 DIAGNOSIS — G8911 Acute pain due to trauma: Secondary | ICD-10-CM | POA: Diagnosis not present

## 2023-07-16 DIAGNOSIS — I440 Atrioventricular block, first degree: Secondary | ICD-10-CM | POA: Diagnosis not present

## 2023-07-16 DIAGNOSIS — W19XXXA Unspecified fall, initial encounter: Secondary | ICD-10-CM | POA: Diagnosis not present

## 2023-07-16 DIAGNOSIS — E871 Hypo-osmolality and hyponatremia: Secondary | ICD-10-CM | POA: Diagnosis not present

## 2023-07-16 DIAGNOSIS — S2249XA Multiple fractures of ribs, unspecified side, initial encounter for closed fracture: Secondary | ICD-10-CM | POA: Diagnosis not present

## 2023-07-16 DIAGNOSIS — S2242XA Multiple fractures of ribs, left side, initial encounter for closed fracture: Secondary | ICD-10-CM | POA: Diagnosis not present

## 2023-07-16 DIAGNOSIS — I7123 Aneurysm of the descending thoracic aorta, without rupture: Secondary | ICD-10-CM | POA: Diagnosis not present

## 2023-07-16 DIAGNOSIS — I3139 Other pericardial effusion (noninflammatory): Secondary | ICD-10-CM | POA: Diagnosis not present

## 2023-07-17 DIAGNOSIS — S2242XA Multiple fractures of ribs, left side, initial encounter for closed fracture: Secondary | ICD-10-CM | POA: Diagnosis not present

## 2023-07-17 DIAGNOSIS — R410 Disorientation, unspecified: Secondary | ICD-10-CM | POA: Diagnosis not present

## 2023-07-17 DIAGNOSIS — S2249XA Multiple fractures of ribs, unspecified side, initial encounter for closed fracture: Secondary | ICD-10-CM | POA: Diagnosis not present

## 2023-07-17 DIAGNOSIS — E871 Hypo-osmolality and hyponatremia: Secondary | ICD-10-CM | POA: Diagnosis not present

## 2023-07-17 DIAGNOSIS — G8911 Acute pain due to trauma: Secondary | ICD-10-CM | POA: Diagnosis not present

## 2023-07-17 DIAGNOSIS — J449 Chronic obstructive pulmonary disease, unspecified: Secondary | ICD-10-CM | POA: Diagnosis not present

## 2023-07-17 DIAGNOSIS — R001 Bradycardia, unspecified: Secondary | ICD-10-CM | POA: Diagnosis not present

## 2023-07-17 DIAGNOSIS — I3139 Other pericardial effusion (noninflammatory): Secondary | ICD-10-CM | POA: Diagnosis not present

## 2023-07-17 DIAGNOSIS — W19XXXA Unspecified fall, initial encounter: Secondary | ICD-10-CM | POA: Diagnosis not present

## 2023-07-17 DIAGNOSIS — Z79899 Other long term (current) drug therapy: Secondary | ICD-10-CM | POA: Diagnosis not present

## 2023-07-17 DIAGNOSIS — R1312 Dysphagia, oropharyngeal phase: Secondary | ICD-10-CM | POA: Diagnosis not present

## 2023-07-17 DIAGNOSIS — I440 Atrioventricular block, first degree: Secondary | ICD-10-CM | POA: Diagnosis not present

## 2023-07-17 DIAGNOSIS — I447 Left bundle-branch block, unspecified: Secondary | ICD-10-CM | POA: Diagnosis not present

## 2023-07-17 DIAGNOSIS — I7123 Aneurysm of the descending thoracic aorta, without rupture: Secondary | ICD-10-CM | POA: Diagnosis not present

## 2023-07-18 DIAGNOSIS — I499 Cardiac arrhythmia, unspecified: Secondary | ICD-10-CM | POA: Diagnosis not present

## 2023-07-18 DIAGNOSIS — E559 Vitamin D deficiency, unspecified: Secondary | ICD-10-CM | POA: Diagnosis not present

## 2023-07-18 DIAGNOSIS — R1312 Dysphagia, oropharyngeal phase: Secondary | ICD-10-CM | POA: Diagnosis not present

## 2023-07-18 DIAGNOSIS — G4710 Hypersomnia, unspecified: Secondary | ICD-10-CM | POA: Diagnosis not present

## 2023-07-18 DIAGNOSIS — W19XXXA Unspecified fall, initial encounter: Secondary | ICD-10-CM | POA: Diagnosis not present

## 2023-07-18 DIAGNOSIS — G629 Polyneuropathy, unspecified: Secondary | ICD-10-CM | POA: Diagnosis not present

## 2023-07-18 DIAGNOSIS — K5900 Constipation, unspecified: Secondary | ICD-10-CM | POA: Diagnosis not present

## 2023-07-18 DIAGNOSIS — J449 Chronic obstructive pulmonary disease, unspecified: Secondary | ICD-10-CM | POA: Diagnosis not present

## 2023-07-18 DIAGNOSIS — G8911 Acute pain due to trauma: Secondary | ICD-10-CM | POA: Diagnosis not present

## 2023-07-18 DIAGNOSIS — R410 Disorientation, unspecified: Secondary | ICD-10-CM | POA: Diagnosis not present

## 2023-07-18 DIAGNOSIS — I3139 Other pericardial effusion (noninflammatory): Secondary | ICD-10-CM | POA: Diagnosis not present

## 2023-07-18 DIAGNOSIS — I7123 Aneurysm of the descending thoracic aorta, without rupture: Secondary | ICD-10-CM | POA: Diagnosis not present

## 2023-07-18 DIAGNOSIS — G2581 Restless legs syndrome: Secondary | ICD-10-CM | POA: Diagnosis not present

## 2023-07-18 DIAGNOSIS — M48061 Spinal stenosis, lumbar region without neurogenic claudication: Secondary | ICD-10-CM | POA: Diagnosis not present

## 2023-07-18 DIAGNOSIS — F909 Attention-deficit hyperactivity disorder, unspecified type: Secondary | ICD-10-CM | POA: Diagnosis not present

## 2023-07-18 DIAGNOSIS — S2242XA Multiple fractures of ribs, left side, initial encounter for closed fracture: Secondary | ICD-10-CM | POA: Diagnosis not present

## 2023-07-18 DIAGNOSIS — M810 Age-related osteoporosis without current pathological fracture: Secondary | ICD-10-CM | POA: Diagnosis not present

## 2023-07-19 DIAGNOSIS — R001 Bradycardia, unspecified: Secondary | ICD-10-CM | POA: Diagnosis not present

## 2023-07-19 DIAGNOSIS — R1312 Dysphagia, oropharyngeal phase: Secondary | ICD-10-CM | POA: Diagnosis not present

## 2023-07-19 DIAGNOSIS — J449 Chronic obstructive pulmonary disease, unspecified: Secondary | ICD-10-CM | POA: Diagnosis not present

## 2023-07-19 DIAGNOSIS — G8911 Acute pain due to trauma: Secondary | ICD-10-CM | POA: Diagnosis not present

## 2023-07-19 DIAGNOSIS — I447 Left bundle-branch block, unspecified: Secondary | ICD-10-CM | POA: Diagnosis not present

## 2023-07-19 DIAGNOSIS — I440 Atrioventricular block, first degree: Secondary | ICD-10-CM | POA: Diagnosis not present

## 2023-07-19 DIAGNOSIS — S2242XA Multiple fractures of ribs, left side, initial encounter for closed fracture: Secondary | ICD-10-CM | POA: Diagnosis not present

## 2023-07-19 DIAGNOSIS — I7123 Aneurysm of the descending thoracic aorta, without rupture: Secondary | ICD-10-CM | POA: Diagnosis not present

## 2023-07-19 DIAGNOSIS — W19XXXA Unspecified fall, initial encounter: Secondary | ICD-10-CM | POA: Diagnosis not present

## 2023-07-19 DIAGNOSIS — I3139 Other pericardial effusion (noninflammatory): Secondary | ICD-10-CM | POA: Diagnosis not present

## 2023-07-20 DIAGNOSIS — W1830XA Fall on same level, unspecified, initial encounter: Secondary | ICD-10-CM | POA: Diagnosis not present

## 2023-07-20 DIAGNOSIS — S2242XA Multiple fractures of ribs, left side, initial encounter for closed fracture: Secondary | ICD-10-CM | POA: Diagnosis not present

## 2023-07-20 DIAGNOSIS — I7100 Dissection of unspecified site of aorta: Secondary | ICD-10-CM | POA: Diagnosis not present

## 2023-07-20 DIAGNOSIS — F419 Anxiety disorder, unspecified: Secondary | ICD-10-CM | POA: Diagnosis not present

## 2023-07-20 DIAGNOSIS — S32019A Unspecified fracture of first lumbar vertebra, initial encounter for closed fracture: Secondary | ICD-10-CM | POA: Diagnosis not present

## 2023-07-20 DIAGNOSIS — I3139 Other pericardial effusion (noninflammatory): Secondary | ICD-10-CM | POA: Diagnosis not present

## 2023-07-20 DIAGNOSIS — I440 Atrioventricular block, first degree: Secondary | ICD-10-CM | POA: Diagnosis not present

## 2023-07-20 DIAGNOSIS — G8911 Acute pain due to trauma: Secondary | ICD-10-CM | POA: Diagnosis not present

## 2023-07-20 DIAGNOSIS — I447 Left bundle-branch block, unspecified: Secondary | ICD-10-CM | POA: Diagnosis not present

## 2023-07-20 DIAGNOSIS — W19XXXA Unspecified fall, initial encounter: Secondary | ICD-10-CM | POA: Diagnosis not present

## 2023-07-20 DIAGNOSIS — R001 Bradycardia, unspecified: Secondary | ICD-10-CM | POA: Diagnosis not present

## 2023-07-21 DIAGNOSIS — S2242XA Multiple fractures of ribs, left side, initial encounter for closed fracture: Secondary | ICD-10-CM | POA: Diagnosis not present

## 2023-07-21 DIAGNOSIS — G8911 Acute pain due to trauma: Secondary | ICD-10-CM | POA: Diagnosis not present

## 2023-07-21 DIAGNOSIS — W19XXXA Unspecified fall, initial encounter: Secondary | ICD-10-CM | POA: Diagnosis not present

## 2023-07-21 DIAGNOSIS — J90 Pleural effusion, not elsewhere classified: Secondary | ICD-10-CM | POA: Diagnosis not present

## 2023-07-21 DIAGNOSIS — S32018A Other fracture of first lumbar vertebra, initial encounter for closed fracture: Secondary | ICD-10-CM | POA: Diagnosis not present

## 2023-07-21 DIAGNOSIS — G8918 Other acute postprocedural pain: Secondary | ICD-10-CM | POA: Diagnosis not present

## 2023-07-21 DIAGNOSIS — F419 Anxiety disorder, unspecified: Secondary | ICD-10-CM | POA: Diagnosis not present

## 2023-07-21 DIAGNOSIS — I71011 Dissection of aortic arch: Secondary | ICD-10-CM | POA: Diagnosis not present

## 2023-07-22 DIAGNOSIS — W19XXXA Unspecified fall, initial encounter: Secondary | ICD-10-CM | POA: Diagnosis not present

## 2023-07-22 DIAGNOSIS — G8918 Other acute postprocedural pain: Secondary | ICD-10-CM | POA: Diagnosis not present

## 2023-07-22 DIAGNOSIS — S32018A Other fracture of first lumbar vertebra, initial encounter for closed fracture: Secondary | ICD-10-CM | POA: Diagnosis not present

## 2023-07-22 DIAGNOSIS — F419 Anxiety disorder, unspecified: Secondary | ICD-10-CM | POA: Diagnosis not present

## 2023-07-22 DIAGNOSIS — J90 Pleural effusion, not elsewhere classified: Secondary | ICD-10-CM | POA: Diagnosis not present

## 2023-07-22 DIAGNOSIS — I71011 Dissection of aortic arch: Secondary | ICD-10-CM | POA: Diagnosis not present

## 2023-07-22 DIAGNOSIS — S2242XA Multiple fractures of ribs, left side, initial encounter for closed fracture: Secondary | ICD-10-CM | POA: Diagnosis not present

## 2023-07-23 ENCOUNTER — Telehealth: Payer: Self-pay

## 2023-07-23 NOTE — Transitions of Care (Post Inpatient/ED Visit) (Signed)
07/23/2023  Name: James Fletcher MRN: [id number] DOB: Sep 30, 1945  Today's TOC FU Call Status: Today's TOC FU Call Status:: Successful TOC FU Call Completed TOC FU Call Complete Date: 07/23/23 Patient's Name and Date of Birth confirmed.  Transition Care Management Follow-up Telephone Call Date of Discharge: 07/22/23 Discharge Facility: Other [facility]) Name of Other (Non[facility]) Discharge Facility: [facility] Type of Discharge: Inpatient Admission Primary Inpatient Discharge Diagnosis:: fall How have you been since you were released from the hospital?: Better Any questions or concerns?: No  Items Reviewed: Did you receive and understand the discharge instructions provided?: Yes Medications obtained,verified, and reconciled?: Yes (Medications Reviewed) Any new allergies since your discharge?: No Dietary orders reviewed?: Yes Do you have support at home?: Yes People in Home: spouse  Medications Reviewed Today: Medications Reviewed Today     Reviewed by Karena Addison, LPN (Licensed Practical Nurse) on 07/23/23 at 1112  Med List Status: <None>   Medication Order Taking? Sig Documenting Provider Last Dose Status Informant  aspirin 81 MG tablet [id number] Yes Take 81 mg by mouth daily. [provider] Taking Active Spouse/Significant Other  atorvastatin (LIPITOR) 80 MG tablet [id number] Yes Take 80 mg by mouth daily. [provider] Taking Active   bisacodyl (DULCOLAX) 5 MG EC tablet [id number] Yes Take 1 tablet by mouth daily. [provider] Taking Active   Calcium Carb-Cholecalciferol 600-10 MG-MCG TABS [id number] Yes Take 1 tablet by mouth daily. [provider] Taking Active   carbamazepine (TEGRETOL) 200 MG tablet [id number] No TAKE TWO TABLETS BY MOUTH TWO TIMES A DAY  Patient not taking: Reported on 07/23/2023   [provider] Not Taking Active   carvedilol (COREG) 12.5 MG tablet [id number] Yes Take 12.5 mg by mouth 2 (two) times  daily with a meal. [provider] Taking Active   diazepam (VALIUM) 2 MG tablet [id number] Yes Take 2 mg by mouth every 6 (six) hours as needed for anxiety. [provider] Taking Active   diclofenac Sodium (VOLTAREN) 1 % GEL [id number] Yes Apply 4 g topically 4 (four) times daily. [provider] Taking Active   finasteride (PROSCAR) 5 MG tablet [id number] Yes TAKE ONE TABLET BY MOUTH EVERY DAY FOR PROSTATE. TAKE WITH TAMSULOSIN. [provider] Taking Active   fluticasone (FLONASE) 50 MCG/ACT nasal spray [id number] Yes INSTILL 2 SPRAYS INTO EACH NOSTRIL EVERY DAY ALLERGIES [provider] Taking Active   gabapentin (NEURONTIN) 400 MG capsule [id number] Yes Take 1,200 mg by mouth 3 (three) times daily.  [provider] Taking Active Spouse/Significant Other  hydrALAZINE (APRESOLINE) 50 MG tablet [id number] Yes Take 50 mg by mouth 3 (three) times daily. [provider] Taking Active            Med Note Anne Hahn, SANDY B   Thu Jun 02, 2019 11:34 AM) Pt unaware of mg's - gets meds [facility]  HYDROcodone-acetaminophen (NORCO/VICODIN) 5-325 MG tablet [id number] Yes Take 1 tablet by mouth every 6 (six) hours as needed for moderate pain. 60 must last 30 days Donita Brooks, MD Taking Active   lidocaine 4 % [id number] Yes Place 1 patch onto the skin daily. [provider] Taking Active   melatonin 3 MG TABS tablet [id number] Yes Take 3 mg by mouth at bedtime. [provider] Taking Active   methocarbamol (ROBAXIN) 500 MG tablet [id number] Yes TAKE TWO TABLETS BY MOUTH TWO TIMES A DAY AS NEEDED FOR MUSCLE SPASMS [provider] Taking Active   Multiple Vitamins-Minerals (MULTIVITAMIN WITH  MINERALS) tablet [id number] Yes Take 1 tablet by mouth daily. [provider] Taking Active Spouse/Significant Other  OLANZapine zydis (ZYPREXA) 5 MG disintegrating tablet [id number] Yes Take 5 mg by mouth at bedtime. [provider]  Taking Active   pantoprazole (PROTONIX) 40 MG tablet [id number] Yes Take 1 tablet (40 mg total) by mouth daily. Donita Brooks, MD Taking Active   polyethylene glycol (MIRALAX / GLYCOLAX) 17 g packet [id number] Yes Take 17 g by mouth daily. [provider] Taking Active   sodium chloride (OCEAN) 0.65 % SOLN nasal spray [id number] Yes Place 1 spray into both nostrils as needed for congestion. Valentino Nose, NP Taking Active   tamsulosin [facility]) 0.4 MG CAPS capsule [id number] Yes Take 0.4 mg by mouth. [provider] Taking Active            Med Note Anne Hahn, SANDY B   Thu Jun 02, 2019 11:34 AM)    valsartan (DIOVAN) 160 MG tablet [id number] Yes TAKE 1 TABLET BY MOUTH ONCE DAILY .  THIS  REPLACES  Sherilyn Dacosta, MD Taking Active   VITAMIN D, CHOLECALCIFEROL, PO [id number] Yes Take 1 tablet by mouth daily. [provider] Taking Active Spouse/Significant Other  zinc gluconate 50 MG tablet [id number] Yes Take 50 mg by mouth daily. [provider] Taking Active   Med List Note [facility] 05/01/14 2703): [facility]             Home Care and Equipment/Supplies: Were Home Health Services Ordered?: Yes Name of Home Health Agency:: unknown Has Agency set up a time to come to your home?: No Any new equipment or medical supplies ordered?: NA  Functional Questionnaire: Do you need assistance with bathing/showering or dressing?: Yes Do you need assistance with meal preparation?: No Do you need assistance with eating?: No Do you have difficulty maintaining continence: No Do you need assistance with getting out of bed/getting out of a chair/moving?: Yes Do you have difficulty managing or taking your medications?: No  Follow up appointments reviewed: PCP Follow-up appointment confirmed?: No (no avail appt, sent message to staff to schedule) MD Provider Line [phone] Given: No Specialist Hospital Follow-up appointment  confirmed?: NA Do you need transportation to your follow-up appointment?: No Do you understand care options if your condition(s) worsen?: Yes-patient verbalized understanding    SIGNATURE Karena Addison, LPN [facility] Nurse Health Advisor Direct Dial [phone]

## 2023-07-31 ENCOUNTER — Ambulatory Visit: Payer: Self-pay | Admitting: Family Medicine

## 2023-07-31 NOTE — Telephone Encounter (Signed)
Chief Complaint: anxiety, would like a medication Symptoms: feeling "panicky and jittery", difficulty sleeping last night Frequency: since being discharged from hospital on 07-22-23 Pertinent Negatives: Patient denies suicidal or homicidal ideation, chest pain, SOB, lightheaded or dizziness  Disposition: [] ED /[x] Urgent Care (no appt availability in office) / [] Appointment(In office/virtual)/ []  [facility] [place] [facility]/ [] Home Care/ [] Refused Recommended Disposition /[]  Mobile Bus/ []  Follow-up with PCP Additional Notes: No appts available today in office. Pt refusing to be seen at other practices. Recommended patient go to ED or UC for treatment today. Patient states he doesn't wish to go to ED or UC because "he feels better after talking to nurse". Made an OV appt for next available, pt is agreeable and was instructed to go to ED or call 911 if he feels like he is having a panic attack.  Copied from CRM [id number]. Topic: Clinical - Red Word Triage >> Jul 31, 2023  9:17 AM Desma Mcgregor wrote: Red Word that prompted transfer to Nurse Triage: Anxiety. Keeping Pt up all night and stated its "killing him". Been happening since last Wed when he left the ER. Looking to get a Rx for it. CB# [id number] Reason for Disposition  [1] Panic attack symptoms (e.g., sudden onset of intense fear and symptoms such as dizziness, feeling of impending doom or fear of dying, hyperventilation, numbness or tingling, sweating, trembling) AND [2] has not been evaluated for this by doctor (or NP/PA)  Answer Assessment - Initial Assessment Questions 1. CONCERN: "Did anything happen that prompted you to call today?"      Progressively worse since discharge from the hospital last Wednesday.  2. ANXIETY SYMPTOMS: "Can you describe how you (your loved one; patient) have been feeling?" (e.g., tense, restless, panicky, anxious, keyed up, overwhelmed, sense of impending doom).      "Very much so panicky"  3. ONSET: "How  long have you been feeling this way?" (e.g., hours, days, weeks)     Since Nov 13th (last Wednesday); constant "last night was absolutely terrible"  4. SEVERITY: "How would you rate the level of anxiety?" (e.g., 0 - 10; or mild, moderate, severe).     "Real jittery" and "it's getting to a point I can't handle it by myself that's why I called"  5. FUNCTIONAL IMPAIRMENT: "How have these feelings affected your ability to do daily activities?" "Have you had more difficulty than usual doing your normal daily activities?" (e.g., getting better, same, worse; self-care, school, work, interactions)     Daily activities were kept up until last night. "Last night seemed to be the culmination of it, really really bad"  6. HISTORY: "Have you felt this way before?" "Have you ever been diagnosed with an anxiety problem in the past?" (e.g., generalized anxiety disorder, panic attacks, PTSD). If Yes, ask: "How was this problem treated?" (e.g., medicines, counseling, etc.)     PTSD; no treatments or medications.  7. RISK OF HARM - SUICIDAL IDEATION: "Do you ever have thoughts of hurting or killing yourself?" If Yes, ask:  "Do you have these feelings now?" "Do you have a plan on how you would do this?"     Denies any suicidal or homicidal ideation at this time or recently.   8. TREATMENT:  "What has been done so far to treat this anxiety?" (e.g., medicines, relaxation strategies). "What has helped?"     Pt states this has never been this bad before. Never recv'd treatment.  9. TREATMENT - THERAPIST: "Do you have a counselor  or therapist? Name?"     No.  10. POTENTIAL TRIGGERS: "Do you drink caffeinated beverages (e.g., coffee, colas, teas), and how much daily?" "Do you drink alcohol or use any drugs?" "Have you started any new medicines recently?"       Drinks a cup of coffee every morning.  11. PATIENT SUPPORT: "Who is with you now?" "Who do you live with?" "Do you have family or friends who you can talk to?"         Wife is at home, sleeping in the bedroom.  12. OTHER SYMPTOMS: "Do you have any other symptoms?" (e.g., feeling depressed, trouble concentrating, trouble sleeping, trouble breathing, palpitations or fast heartbeat, chest pain, sweating, nausea, or diarrhea)       Trouble sleeping, especially last night felt "like I was gonna have a heart attack it got that bad". Last night states he felt palpitations. Denies any now.  Protocols used: Anxiety and Panic Attack-A-AH

## 2023-08-10 ENCOUNTER — Ambulatory Visit: Payer: No Typology Code available for payment source | Admitting: Family Medicine

## 2023-08-11 ENCOUNTER — Ambulatory Visit: Payer: No Typology Code available for payment source | Admitting: Family Medicine

## 2023-08-12 ENCOUNTER — Encounter: Payer: Self-pay | Admitting: Family Medicine

## 2023-08-12 ENCOUNTER — Ambulatory Visit (INDEPENDENT_AMBULATORY_CARE_PROVIDER_SITE_OTHER): Payer: No Typology Code available for payment source | Admitting: Family Medicine

## 2023-08-12 VITALS — BP 126/72 | HR 50 | Temp 97.9°F | Ht 70.0 in | Wt 210.5 lb

## 2023-08-12 DIAGNOSIS — W19XXXD Unspecified fall, subsequent encounter: Secondary | ICD-10-CM | POA: Diagnosis not present

## 2023-08-12 DIAGNOSIS — Y92009 Unspecified place in unspecified non-institutional (private) residence as the place of occurrence of the external cause: Secondary | ICD-10-CM | POA: Diagnosis not present

## 2023-08-12 DIAGNOSIS — R300 Dysuria: Secondary | ICD-10-CM

## 2023-08-12 DIAGNOSIS — Z79899 Other long term (current) drug therapy: Secondary | ICD-10-CM

## 2023-08-12 DIAGNOSIS — I10 Essential (primary) hypertension: Secondary | ICD-10-CM

## 2023-08-12 DIAGNOSIS — M48061 Spinal stenosis, lumbar region without neurogenic claudication: Secondary | ICD-10-CM | POA: Diagnosis not present

## 2023-08-12 LAB — URINALYSIS, ROUTINE W REFLEX MICROSCOPIC
Bilirubin Urine: NEGATIVE
Glucose, UA: NEGATIVE
Hgb urine dipstick: NEGATIVE
Ketones, ur: NEGATIVE
Leukocytes,Ua: NEGATIVE
Nitrite: NEGATIVE
Protein, ur: NEGATIVE
Specific Gravity, Urine: 1.015 (ref 1.001–1.035)
pH: 6 (ref 5.0–8.0)

## 2023-08-12 NOTE — Assessment & Plan Note (Signed)
UA negative. Urinary frequency improved from baseline with changes to antihypertensives. Continue flomax and proscar. Follow up with PCP as needed.

## 2023-08-12 NOTE — Assessment & Plan Note (Signed)
Hospital follow up medication reconciliation completed.

## 2023-08-12 NOTE — Assessment & Plan Note (Signed)
Chronic well controlled, not monitoring at home. Continue Carvedilol 37.5mg  every 12 hours, Hydralazine 100mg  every 8 hours, Spironolactone 25mg  daily, and Valsartan 320mg  daily as instructed by hospitalist. Encouraged to monitor at home and report to PCP if sustains less than 100/60 or greater than 140/90. Take medications as prescribed and bring medications and blood pressure log with cuff to each office visit. Seek medical care for chest pain, palpitations, shortness of breath with exertion, dizziness/lightheadedness, vision changes, recurrent headaches, or swelling of extremities.

## 2023-08-12 NOTE — Assessment & Plan Note (Signed)
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  He states that he had surgical repair in past and is not candidate for further surgical intervention and has spinal stenosis on MRI. PCP treating with 60 hydrocodone per month to help with his chronic back pain as well as with his peripheral neuropathy. I strongly recommended he follow through with PT/OT. Follow up as needed with PCP.

## 2023-08-12 NOTE — Assessment & Plan Note (Signed)
Encouraged to follow up with PT/OT as ordered. He is back to baseline and has had no subsequent falls. BP well controlled with medication adjustments. CBC and CMP done today. [facility]. Sleep is improved on Melatonin. Pain at baseline and managed by PCP.

## 2023-08-12 NOTE — Progress Notes (Signed)
Subjective:  HPI: James Fletcher is a 77 y.o. male presenting on 08/12/2023 for Transitions Of Care [facility] follow up from [facility]) and Urinary Frequency (Pt went to the [place] clinic for "loss of bladder control" pt states that ever since he has his catheter removed from when he was hospitalized, he is having trouble controlling his bladder. )   Urinary Frequency  Associated symptoms include frequency.   Patient is in today for hospital follow-up for fall 1 month ago due to falling asleep while toileting. Patient reports this is due to lack of sleep related to back pain. Was DC with home PT/OT, has not started this. He reports ongoing dysuria and nocturia with incontinence after having catheter removed however this is improved from his baseline since changes in his antihypertensives. Denies saddle numbness or loss of stool. Pain is back to his baseline. He is not monitoring his blood pressure at home. He is taking Carvedilol 37.5mg  every 12 hours, Hydralazine 100mg  every 8 hours, Spironolactone 25mg  daily, and Valsartan 320mg  daily. In addition he is not sleeping any better and has not been able to obtain Olanzapine he was prescribed in the hospital due to [facility] requiring psychiatry evaluation, this is in the works.  HPI 07/10/2023 for reference only:  James Fletcher is a 77 y.o. male with PTSD, lumbar spinal stenosis, chronic back pain, neuropathy, BPH, emphysema, GERD, HLD, HTN, liver lesion, hepatosplenomegaly, and tobacco use presents as level 3 trauma transfer due to fall from standing on 11/1. [place] Seat backwards and landed on back.   Injuries include: - Left 8-13 rib fx - Acute minimally displaced fx of L1 - Descending thoracic aortic intramural hematoma with high attenuation small volume pericardial effusion   11/1: Admit to SICU for rib fx protocol, BP control   Review of Systems  Genitourinary:  Positive for frequency.  All other systems reviewed and are negative.   Relevant past medical  history reviewed and updated as indicated.   Past Medical History:  Diagnosis Date   Cholecystitis    Left bundle-branch block 02/26/2012   Neuropathy 02/26/2012   Peripheral neuropathy      Past Surgical History:  Procedure Laterality Date   APPENDECTOMY     CHOLECYSTECTOMY N/A 05/01/2014   Procedure: LAPAROSCOPIC CHOLECYSTECTOMY;  Surgeon: Liz Malady, MD;  Location: [facility] OR;  Service: General;  Laterality: N/A;   HERNIA REPAIR      Allergies and medications reviewed and updated.   Current Outpatient Medications:    aspirin 81 MG tablet, Take 81 mg by mouth daily., Disp: , Rfl:    atorvastatin (LIPITOR) 80 MG tablet, Take 80 mg by mouth daily., Disp: , Rfl:    bisacodyl (DULCOLAX) 5 MG EC tablet, Take 1 tablet by mouth daily., Disp: , Rfl:    Calcium Carb-Cholecalciferol 600-10 MG-MCG TABS, Take 1 tablet by mouth daily., Disp: , Rfl:    carvedilol (COREG) 12.5 MG tablet, Take 37.5 mg by mouth 2 (two) times daily with a meal., Disp: , Rfl:    diclofenac Sodium (VOLTAREN) 1 % GEL, Apply 4 g topically 4 (four) times daily., Disp: , Rfl:    DULoxetine (CYMBALTA) 60 MG capsule, Take 60 mg by mouth daily., Disp: , Rfl:    finasteride (PROSCAR) 5 MG tablet, TAKE ONE TABLET BY MOUTH EVERY DAY FOR PROSTATE. TAKE WITH TAMSULOSIN., Disp: , Rfl:    fluticasone (FLONASE) 50 MCG/ACT nasal spray, INSTILL 2 SPRAYS INTO EACH NOSTRIL EVERY DAY ALLERGIES, Disp: , Rfl:  gabapentin (NEURONTIN) 400 MG capsule, Take 800 mg by mouth 3 (three) times daily., Disp: , Rfl:    hydrALAZINE (APRESOLINE) 100 MG tablet, Take 100 mg by mouth every 8 (eight) hours., Disp: , Rfl:    HYDROcodone-acetaminophen (NORCO/VICODIN) 5-325 MG tablet, Take 1 tablet by mouth every 6 (six) hours as needed for moderate pain. 60 must last 30 days, Disp: 60 tablet, Rfl: 0   lidocaine 4 %, Place 1 patch onto the skin daily., Disp: , Rfl:    melatonin 3 MG TABS tablet, Take 9 mg by mouth at bedtime., Disp: , Rfl:    meloxicam  (MOBIC) 15 MG tablet, Take 15 mg by mouth daily as needed for pain., Disp: , Rfl:    methocarbamol (ROBAXIN) 500 MG tablet, TAKE TWO TABLETS BY MOUTH TWO TIMES A DAY AS NEEDED FOR MUSCLE SPASMS, Disp: , Rfl:    Multiple Vitamins-Minerals (MULTIVITAMIN WITH MINERALS) tablet, Take 1 tablet by mouth daily., Disp: , Rfl:    pantoprazole (PROTONIX) 40 MG tablet, Take 1 tablet (40 mg total) by mouth daily., Disp: 30 tablet, Rfl: 3   polyethylene glycol (MIRALAX / GLYCOLAX) 17 g packet, Take 17 g by mouth daily., Disp: , Rfl:    sodium chloride (OCEAN) 0.65 % SOLN nasal spray, Place 1 spray into both nostrils as needed for congestion., Disp: 88 mL, Rfl: 0   spironolactone (ALDACTONE) 25 MG tablet, Take 25 mg by mouth daily., Disp: , Rfl:    tamsulosin (FLOMAX) 0.4 MG CAPS capsule, Take 0.8 mg by mouth., Disp: , Rfl:    valsartan (DIOVAN) 160 MG tablet, TAKE 1 TABLET BY MOUTH ONCE DAILY .  THIS  REPLACES  LOSARTAN (Patient taking differently: Take 320 mg by mouth daily.), Disp: 30 tablet, Rfl: 0   VITAMIN D, CHOLECALCIFEROL, PO, Take 1 tablet by mouth daily., Disp: , Rfl:    zinc gluconate 50 MG tablet, Take 50 mg by mouth daily., Disp: , Rfl:    diazepam (VALIUM) 2 MG tablet, Take 2 mg by mouth every 6 (six) hours as needed for anxiety. (Patient not taking: Reported on 08/12/2023), Disp: , Rfl:    OLANZapine zydis (ZYPREXA) 5 MG disintegrating tablet, Take 5 mg by mouth at bedtime. (Patient not taking: Reported on 08/12/2023), Disp: , Rfl:   No Known Allergies  Objective:   BP 126/72 (BP Location: Left Arm)   Pulse (!) 50   Temp 97.9 F (36.6 C)   Ht 5\' 10"  (1.778 m)   Wt 210 lb 8 oz (95.5 kg)   SpO2 96%   BMI 30.20 kg/m      08/12/2023    8:54 AM 06/01/2023    7:25 AM 06/01/2023    7:00 AM  Vitals with BMI  Height 5\' 10"     Weight 210 lbs 8 oz    BMI 30.2    Systolic 126 175 696  Diastolic 72 75 111  Pulse 50 59 57     Physical Exam Vitals and nursing note reviewed.  Constitutional:       Appearance: Normal appearance. He is normal weight.  HENT:     Head: Normocephalic and atraumatic.  Cardiovascular:     Rate and Rhythm: Normal rate and regular rhythm.     Pulses: Normal pulses.     Heart sounds: Normal heart sounds.  Pulmonary:     Effort: Pulmonary effort is normal.     Breath sounds: Normal breath sounds.  Skin:    General: Skin is warm and  dry.     Capillary Refill: Capillary refill takes less than 2 seconds.  Neurological:     General: No focal deficit present.     Mental Status: He is alert and oriented to person, place, and time. Mental status is at baseline.  Psychiatric:        Mood and Affect: Mood normal.        Behavior: Behavior normal.        Thought Content: Thought content normal.        Judgment: Judgment normal.     Assessment & Plan:  Fall at home, subsequent encounter Assessment & Plan: Encouraged to follow up with PT/OT as ordered. He is back to baseline and has had no subsequent falls. BP well controlled with medication adjustments. CBC and CMP done today. [facility]. Sleep is improved on Melatonin. Pain at baseline and managed by PCP.   Essential (primary) hypertension Assessment & Plan: Chronic well controlled, not monitoring at home. Continue Carvedilol 37.5mg  every 12 hours, Hydralazine 100mg  every 8 hours, Spironolactone 25mg  daily, and Valsartan 320mg  daily as instructed by hospitalist. Encouraged to monitor at home and report to PCP if sustains less than 100/60 or greater than 140/90. Take medications as prescribed and bring medications and blood pressure log with cuff to each office visit. Seek medical care for chest pain, palpitations, shortness of breath with exertion, dizziness/lightheadedness, vision changes, recurrent headaches, or swelling of extremities.   Orders: -     CBC with Differential/Platelet -     COMPLETE METABOLIC PANEL WITH  GFR  Dysuria Assessment & Plan: UA negative. Urinary frequency improved from baseline with changes to antihypertensives. Continue flomax and proscar. Follow up with PCP as needed.  Orders: -     Urinalysis, Routine w reflex microscopic  Encounter for medication review Assessment & Plan: Hospital follow up medication reconciliation completed.   Spinal stenosis of lumbar region without neurogenic claudication Assessment & Plan: Chronic at baseline due to lumbar degenerative disc disease.  He states that he had surgical repair in past and is not candidate for further surgical intervention and has spinal stenosis on MRI. PCP treating with 60 hydrocodone per month to help with his chronic back pain as well as with his peripheral neuropathy. I strongly recommended he follow through with PT/OT. Follow up as needed with PCP.      Follow up plan: Return as needed with PCP.  Park Meo, FNP

## 2023-08-13 LAB — COMPLETE METABOLIC PANEL WITH GFR
AG Ratio: 2 (calc) (ref 1.0–2.5)
ALT: 16 U/L (ref 9–46)
AST: 16 U/L (ref 10–35)
Albumin: 3.9 g/dL (ref 3.6–5.1)
Alkaline phosphatase (APISO): 85 U/L (ref 35–144)
BUN/Creatinine Ratio: 19 (calc) (ref 6–22)
BUN: 12 mg/dL (ref 7–25)
CO2: 26 mmol/L (ref 20–32)
Calcium: 8.7 mg/dL (ref 8.6–10.3)
Chloride: 100 mmol/L (ref 98–110)
Creat: 0.64 mg/dL — ABNORMAL LOW (ref 0.70–1.28)
Globulin: 2 g/dL (ref 1.9–3.7)
Glucose, Bld: 91 mg/dL (ref 65–99)
Potassium: 4.3 mmol/L (ref 3.5–5.3)
Sodium: 134 mmol/L — ABNORMAL LOW (ref 135–146)
Total Bilirubin: 0.3 mg/dL (ref 0.2–1.2)
Total Protein: 5.9 g/dL — ABNORMAL LOW (ref 6.1–8.1)
eGFR: 98 mL/min/{1.73_m2} (ref >=60)

## 2023-08-13 LAB — CBC WITH DIFFERENTIAL/PLATELET
Absolute Lymphocytes: 1122 {cells}/uL (ref 850–3900)
Absolute Monocytes: 577 {cells}/uL (ref 200–950)
Basophils Absolute: 68 {cells}/uL (ref 0–200)
Basophils Relative: 1.1 %
Eosinophils Absolute: 186 {cells}/uL (ref 15–500)
Eosinophils Relative: 3 %
HCT: 27.3 % — ABNORMAL LOW (ref 38.5–50.0)
Hemoglobin: 8.4 g/dL — ABNORMAL LOW (ref 13.2–17.1)
MCH: 24.6 pg — ABNORMAL LOW (ref 27.0–33.0)
MCHC: 30.8 g/dL — ABNORMAL LOW (ref 32.0–36.0)
MCV: 80.1 fL (ref 80.0–100.0)
MPV: 9.9 fL (ref 7.5–12.5)
Monocytes Relative: 9.3 %
Neutro Abs: 4247 {cells}/uL (ref 1500–7800)
Neutrophils Relative %: 68.5 %
Platelets: 218 10*3/uL (ref 140–400)
RBC: 3.41 10*6/uL — ABNORMAL LOW (ref 4.20–5.80)
RDW: 15.7 % — ABNORMAL HIGH (ref 11.0–15.0)
Total Lymphocyte: 18.1 %
WBC: 6.2 10*3/uL (ref 3.8–10.8)

## 2023-08-19 ENCOUNTER — Ambulatory Visit: Payer: No Typology Code available for payment source | Admitting: Family Medicine

## 2023-08-20 ENCOUNTER — Ambulatory Visit: Payer: Medicare Other | Admitting: Family Medicine

## 2023-08-20 ENCOUNTER — Encounter: Payer: Self-pay | Admitting: Family Medicine

## 2023-08-20 VITALS — BP 140/78 | HR 78 | Temp 97.6°F | Ht 70.0 in | Wt 209.1 lb

## 2023-08-20 DIAGNOSIS — Y92009 Unspecified place in unspecified non-institutional (private) residence as the place of occurrence of the external cause: Secondary | ICD-10-CM | POA: Diagnosis not present

## 2023-08-20 DIAGNOSIS — G4700 Insomnia, unspecified: Secondary | ICD-10-CM | POA: Diagnosis not present

## 2023-08-20 DIAGNOSIS — M48061 Spinal stenosis, lumbar region without neurogenic claudication: Secondary | ICD-10-CM | POA: Diagnosis not present

## 2023-08-20 DIAGNOSIS — W19XXXD Unspecified fall, subsequent encounter: Secondary | ICD-10-CM

## 2023-08-20 MED ORDER — OXYCODONE-ACETAMINOPHEN 5-325 MG PO TABS: 1.0000 | ORAL_TABLET | ORAL | 0 refills | Status: AC | PRN

## 2023-08-20 NOTE — Progress Notes (Signed)
Subjective:    Patient ID: James James, male    DOB: 1945-12-12, 77 y.o.   MRN: [id number]  Back Pain    Patient has a history of lumbar degenerative disc disease.  He states that he had surgery to repair a ruptured disc at L4-L5 in the past.  This was performed through the [place].  He states that his MRI of his lumbar spine showed spinal stenosis.  He was referred to a pain clinic through the [place]. recently I agreed to give the patient 60 hydrocodone per month to help with his chronic back pain as well as with his peripheral neuropathy.  Today he presents with his wife requesting a letter stating that he is unable to work.  He is trying to get disability through the [place].  He states that he has chronic back pain stemming from degenerative disc disease and from spinal stenosis.  He had previous surgery as discussed above.  However, despite the surgery he continues to suffer from low back pain and peripheral neuropathy.  [facility] and also referred him to the pain clinic however they were unable to treat the patient's pain with narcotics which is ultimately how they want him to my clinic requesting medication for pain.  I explained to the patient that I will be happy to write a letter on his behalf.  However, I have no documentation to provide his attorney it would help this case.  Specifically I do not have his MRI reports from the [place].  I do not have his operative notes.  I am not the doctor who referred him to the surgeon nor to the pain clinic.  I explained to the family that this would help prove his case as far as chronicity and severity of his pain.  I did not leave the bladder with the left leg with convincing having a lack of documentation and the fact that I just assumed his pain control 2 months ago.  08/20/23 Patient is here today for hospital discharge follow-up.  The patient was urinating in November.  He states that he fell asleep or passed out while urinating.   This occurred in the middle of the night.  He fell and fractured several ribs.  He was hospitalized for more than 10 days at the [place].  At the [facility], he was started on Zyprexa due to agitation and insomnia.  He also was having urinary retention and required a Foley catheter to be placed for many days.  Patient states that the reason he is unable to sleep is due to the pain in his back.  He states that the pain in his back is severe.  His wife is here with him today and states that at night, he is in constant pain and therefore he cannot sleep.  When he is not sleeping he becomes agitated and anxious.  He has had several panic attacks all occurring at night he was given Valium through the [place] but he is not taking this.  Therefore he is not on the Zyprexa.  He is not on the Valium.  He is yet to see the psychiatrist recommended by the [place].  He is requesting for assistance at night to help with sleep.  His wife denies any sundowning at night.  She states that he is not delirious or confused.  However he does become overcome with pain in his lower back and agitated due to the pain and his lack  of sleep. Past Medical History:  Diagnosis Date   Cholecystitis    Left bundle-branch block 02/26/2012   Neuropathy 02/26/2012   Peripheral neuropathy    Past Surgical History:  Procedure Laterality Date   APPENDECTOMY     CHOLECYSTECTOMY N/A 05/01/2014   Procedure: LAPAROSCOPIC CHOLECYSTECTOMY;  Surgeon: James Malady, MD;  Location: [facility] OR;  Service: General;  Laterality: N/A;   HERNIA REPAIR     Current Outpatient Medications on File Prior to Visit  Medication Sig Dispense Refill   aspirin 81 MG tablet Take 81 mg by mouth daily.     atorvastatin (LIPITOR) 80 MG tablet Take 80 mg by mouth daily.     bisacodyl (DULCOLAX) 5 MG EC tablet Take 1 tablet by mouth daily.     Calcium Carb-Cholecalciferol 600-10 MG-MCG TABS Take 1 tablet by mouth daily.     carvedilol (COREG) 12.5 MG tablet Take 37.5 mg by mouth 2 (two)  times daily with a meal.     diazepam (VALIUM) 2 MG tablet Take 2 mg by mouth every 6 (six) hours as needed for anxiety.     diclofenac Sodium (VOLTAREN) 1 % GEL Apply 4 g topically 4 (four) times daily.     DULoxetine (CYMBALTA) 60 MG capsule Take 60 mg by mouth daily.     finasteride (PROSCAR) 5 MG tablet TAKE ONE TABLET BY MOUTH EVERY DAY FOR PROSTATE. TAKE WITH TAMSULOSIN.     fluticasone (FLONASE) 50 MCG/ACT nasal spray INSTILL 2 SPRAYS INTO EACH NOSTRIL EVERY DAY ALLERGIES     gabapentin (NEURONTIN) 400 MG capsule Take 800 mg by mouth 3 (three) times daily.     hydrALAZINE (APRESOLINE) 100 MG tablet Take 100 mg by mouth every 8 (eight) hours.     lidocaine 4 % Place 1 patch onto the skin daily.     melatonin 3 MG TABS tablet Take 9 mg by mouth at bedtime.     meloxicam (MOBIC) 15 MG tablet Take 15 mg by mouth daily as needed for pain.     methocarbamol (ROBAXIN) 500 MG tablet TAKE TWO TABLETS BY MOUTH TWO TIMES A DAY AS NEEDED FOR MUSCLE SPASMS     Multiple Vitamins-Minerals (MULTIVITAMIN WITH MINERALS) tablet Take 1 tablet by mouth daily.     OLANZapine zydis (ZYPREXA) 5 MG disintegrating tablet Take 5 mg by mouth at bedtime.     pantoprazole (PROTONIX) 40 MG tablet Take 1 tablet (40 mg total) by mouth daily. 30 tablet 3   polyethylene glycol (MIRALAX / GLYCOLAX) 17 g packet Take 17 g by mouth daily.     sodium chloride (OCEAN) 0.65 % SOLN nasal spray Place 1 spray into both nostrils as needed for congestion. 88 mL 0   spironolactone (ALDACTONE) 25 MG tablet Take 25 mg by mouth daily.     tamsulosin (FLOMAX) 0.4 MG CAPS capsule Take 0.8 mg by mouth.     valsartan (DIOVAN) 160 MG tablet TAKE 1 TABLET BY MOUTH ONCE DAILY .  THIS  REPLACES  LOSARTAN (Patient taking differently: Take 320 mg by mouth daily.) 30 tablet 0   VITAMIN D, CHOLECALCIFEROL, PO Take 1 tablet by mouth daily.     zinc gluconate 50 MG tablet Take 50 mg by mouth daily.     No current facility-administered medications on  file prior to visit.   No Known Allergies Social History   Socioeconomic History   Marital status: Married    Spouse name: James James   Number of children: 2  Years of education: Not on file   Highest education level: Not on file  Occupational History   Not on file  Tobacco Use   Smoking status: Every Day    Current packs/day: 1.00    Average packs/day: 1 pack/day for 30.0 years (30.0 ttl pk-yrs)    Types: Cigarettes   Smokeless tobacco: Never  Substance and Sexual Activity   Alcohol use: No   Drug use: No   Sexual activity: Not on file  Other Topics Concern   Not on file  Social History Narrative   1 son and 1 daughter.   Social Drivers of Corporate investment banker Strain: Low Risk  (10/11/2021)   Overall Financial Resource Strain (CARDIA)    Difficulty of Paying Living Expenses: Not hard at all  Food Insecurity: No Food Insecurity (10/11/2021)   Hunger Vital Sign    Worried About Running Out of Food in the Last Year: Never true    Ran Out of Food in the Last Year: Never true  Transportation Needs: No Transportation Needs (07/11/2023)   Received from [facility] - Transportation    In the past 12 months, has lack of transportation kept you from medical appointments or from getting medications?: No    Lack of Transportation (Non-Medical): No  Physical Activity: Inactive (10/11/2021)   Exercise Vital Sign    Days of Exercise per Week: 0 days    Minutes of Exercise per Session: 0 min  Stress: Stress Concern Present (10/11/2021)   [employer] of Occupational Health - Occupational Stress Questionnaire    Feeling of Stress : To some extent  Social Connections: Socially Integrated (10/11/2021)   Social Connection and Isolation Panel [NHANES]    Frequency of Communication with Friends and Family: More than three times a week    Frequency of Social Gatherings with Friends and Family: More than three times a week    Attends Religious Services: 1 to 4  times per year    Active Member of [employer] or Organizations: No    Attends Banker Meetings: 1 to 4 times per year    Marital Status: Married  Catering manager Violence: Not At Risk (10/11/2021)   Humiliation, Afraid, Rape, and Kick questionnaire    Fear of Current or Ex-Partner: No    Emotionally Abused: No    Physically Abused: No    Sexually Abused: No      Review of Systems  Musculoskeletal:  Positive for back pain.  All other systems reviewed and are negative.      Objective:   Physical Exam Vitals reviewed.  Constitutional:      General: He is not in acute distress.    Appearance: He is not ill-appearing or toxic-appearing.  Cardiovascular:     Rate and Rhythm: Normal rate and regular rhythm.     Pulses: Normal pulses.     Heart sounds: Normal heart sounds.  Pulmonary:     Effort: Pulmonary effort is normal. No respiratory distress.     Breath sounds: No stridor or decreased air movement. No decreased breath sounds, wheezing, rhonchi or rales.  Chest:     Chest wall: No tenderness.  Abdominal:     General: Bowel sounds are normal. There is no distension.     Palpations: Abdomen is soft.     Tenderness: There is no abdominal tenderness. There is no guarding.  Musculoskeletal:     Lumbar back: Tenderness and bony tenderness present. Decreased  range of motion.     Right lower leg: No edema.     Left lower leg: No edema.  Neurological:     General: No focal deficit present.     Mental Status: He is alert and oriented to person, place, and time.     Cranial Nerves: No cranial nerve deficit.     Sensory: No sensory deficit.     Motor: No weakness.     Coordination: Coordination normal.     Gait: Gait normal.           Assessment & Plan:  Fall at home, subsequent encounter  Spinal stenosis of lumbar region without neurogenic claudication  Insomnia, unspecified type Both the patient and his wife are adamant that he is not experiencing sundowning  or delirium at night.  They have not been using the Zyprexa at home.  Patient today is alert and oriented and at his baseline.  Wife states that he is having occasional panic attacks at home.  He feels like his heart is going to beat out of his chest.  He feels short of breath.  He feels like he is going to die.  He feels extremely anxious and overwhelmed.  We discussed using Valium as needed however he states that the panic attacks have improved over the last 2 weeks.  He is to issue at the present time is his trouble sleeping.  He states that the pain in his back keeps him awake at night.  When he is lying in bed, he tends to perseverate.  This seems to trigger the panic attacks.  He thinks that if he could just have something for the pain at night he would be able to sleep and not require additional medication.  Therefore, we have decided to start oxycodone/acetaminophen 5/325 1 p.o. nightly number 30 tablets/month.  We will see how this works and reassess in 2 weeks.  HE is not taking Valium.  He is not taking Zyprexa.

## 2023-08-26 ENCOUNTER — Other Ambulatory Visit: Payer: Medicare Other

## 2023-08-26 DIAGNOSIS — D72819 Decreased white blood cell count, unspecified: Secondary | ICD-10-CM

## 2023-08-26 DIAGNOSIS — D649 Anemia, unspecified: Secondary | ICD-10-CM

## 2023-08-27 LAB — IRON,TIBC AND FERRITIN PANEL
%SAT: 3 % — ABNORMAL LOW (ref 20–48)
Ferritin: 7 ng/mL — ABNORMAL LOW (ref 24–380)
Iron: 11 ug/dL — ABNORMAL LOW (ref 50–180)
TIBC: 395 ug/dL (ref 250–425)

## 2023-09-04 LAB — FECAL GLOBIN BY IMMUNOCHEMISTRY
FECAL GLOBIN RESULT:: NOT DETECTED
MICRO NUMBER:: 15890434
SPECIMEN QUALITY:: ADEQUATE

## 2023-09-10 ENCOUNTER — Ambulatory Visit: Payer: Self-pay | Admitting: Family Medicine

## 2023-09-10 ENCOUNTER — Ambulatory Visit (INDEPENDENT_AMBULATORY_CARE_PROVIDER_SITE_OTHER): Payer: Medicare Other | Admitting: Family Medicine

## 2023-09-10 ENCOUNTER — Encounter: Payer: Self-pay | Admitting: Family Medicine

## 2023-09-10 VITALS — BP 180/80 | HR 68 | Ht 70.0 in | Wt 215.0 lb

## 2023-09-10 DIAGNOSIS — I5020 Unspecified systolic (congestive) heart failure: Secondary | ICD-10-CM | POA: Diagnosis not present

## 2023-09-10 DIAGNOSIS — J209 Acute bronchitis, unspecified: Secondary | ICD-10-CM

## 2023-09-10 DIAGNOSIS — I10 Essential (primary) hypertension: Secondary | ICD-10-CM

## 2023-09-10 MED ORDER — AMOXICILLIN-POT CLAVULANATE 875-125 MG PO TABS: 1.0000 | ORAL_TABLET | Freq: Two times a day (BID) | ORAL | 0 refills | Status: DC

## 2023-09-10 MED ORDER — BENZONATATE 100 MG PO CAPS: 100.0000 mg | ORAL_CAPSULE | Freq: Two times a day (BID) | ORAL | 0 refills | Status: DC | PRN

## 2023-09-10 NOTE — Patient Instructions (Addendum)
 F/u appointment with PCP in 1 to 2 weeks, re evaluate blood pressure and acute bronchitis  Nurse BP check in primary care office in 1 week  cXR today  Medications, Augmentin , tessalon  Perles are prescribed Pls get respiratory panel today, RSV, flu and covid  Medication for Blood pressure is hydralazine , spironolactone, carvedilol and valsartan , pls ensure  you are taking them as prescribed  Pls stop all oTC cough and cold medication except

## 2023-09-10 NOTE — Telephone Encounter (Signed)
  Chief Complaint: think I have bronchitis Symptoms: cough, chest congestion, runny nose, SOB when lying flat at night Frequency: x 7 days Pertinent Negatives: Patient denies fever, chest pain, sore throat  Disposition: [] ED /[] Urgent Care (no appt availability in office) / [x] Appointment(In office/virtual)/ []  [facility]/ [] Home Care/ [] Refused Recommended Disposition /[] [place] [facility]/ []  Follow-up with PCP Additional Notes: Patient agreeable to office visit today; none available with PCP office. Pt agreeable to [facility] appointment this afternoon. Pt verbalizes understanding of home care advice.  Copied from CRM [id number]. Topic: Appointments - Appointment Scheduling >> Sep 10, 2023 11:03 AM Tonda B wrote: Patient/patient representative is calling to schedule an appointment. Pt is having difficult breathing and coughing Refer to attachments for appointment information. Reason for Disposition  [1] MILD difficulty breathing (e.g., minimal/no SOB at rest, SOB with walking, pulse <100) AND [2] still present when not coughing  Answer Assessment - Initial Assessment Questions 1. ONSET: When did the cough begin?      X 7 days.  2. SEVERITY: How bad is the cough today?      It's the same every day.  3. SPUTUM: Describe the color of your sputum (none, dry cough; clear, white, yellow, green)     Yellow to clear tint. Pt states it is difficult to cough up the sputum, feels stuck in his chest.  4. HEMOPTYSIS: Are you coughing up any blood? If so ask: How much? (flecks, streaks, tablespoons, etc.)     Denies blood.  5. DIFFICULTY BREATHING: Are you having difficulty breathing? If Yes, ask: How bad is it? (e.g., mild, moderate, severe)    - MILD: No SOB at rest, mild SOB with walking, speaks normally in sentences, can lie down, no retractions, pulse < 100.    - MODERATE: SOB at rest, SOB with minimal exertion and prefers to sit, cannot lie down  flat, speaks in phrases, mild retractions, audible wheezing, pulse 100-120.    - SEVERE: Very SOB at rest, speaks in single words, struggling to breathe, sitting hunched forward, retractions, pulse > 120      Pt states he does not feel SOB talking on the phone or sitting upright, only at night when lying flat.  6. FEVER: Do you have a fever? If Yes, ask: What is your temperature, how was it measured, and when did it start?     Denies.  7. CARDIAC HISTORY: Do you have any history of heart disease? (e.g., heart attack, congestive heart failure)      High blood pressure.  8. LUNG HISTORY: Do you have any history of lung disease?  (e.g., pulmonary embolus, asthma, emphysema)     Denies.  9. PE RISK FACTORS: Do you have a history of blood clots? (or: recent major surgery, recent prolonged travel, bedridden)     Denies. Pt states he was recently hospitalized in Nov for broken ribs.  10. OTHER SYMPTOMS: Do you have any other symptoms? (e.g., runny nose, wheezing, chest pain)       Runny nose, wheezing at night.  11. TRAVEL: Have you traveled out of the country in the last month? (e.g., travel history, exposures)       Pt unsure of any known exposures, pt states he stayed home and didn't go to may of the holiday parties to stay away from people.  Protocols used: Cough - Acute Productive-A-AH

## 2023-09-11 ENCOUNTER — Ambulatory Visit (HOSPITAL_COMMUNITY)
Admission: RE | Admit: 2023-09-11 | Discharge: 2023-09-11 | Disposition: A | Discharge status: Home / Self Care | Payer: Medicare Other | Source: Ambulatory Visit | Attending: Family Medicine | Admitting: Family Medicine

## 2023-09-11 DIAGNOSIS — J209 Acute bronchitis, unspecified: Secondary | ICD-10-CM | POA: Diagnosis not present

## 2023-09-11 DIAGNOSIS — R059 Cough, unspecified: Secondary | ICD-10-CM | POA: Diagnosis not present

## 2023-09-11 DIAGNOSIS — J449 Chronic obstructive pulmonary disease, unspecified: Secondary | ICD-10-CM | POA: Diagnosis not present

## 2023-09-11 DIAGNOSIS — J440 Chronic obstructive pulmonary disease with acute lower respiratory infection: Secondary | ICD-10-CM | POA: Insufficient documentation

## 2023-09-11 DIAGNOSIS — R918 Other nonspecific abnormal finding of lung field: Secondary | ICD-10-CM | POA: Diagnosis not present

## 2023-09-11 DIAGNOSIS — F17200 Nicotine dependence, unspecified, uncomplicated: Secondary | ICD-10-CM | POA: Diagnosis not present

## 2023-09-13 LAB — COVID-19, FLU A+B AND RSV
Influenza A, NAA: NOT DETECTED
Influenza B, NAA: NOT DETECTED
RSV, NAA: NOT DETECTED
SARS-CoV-2, NAA: NOT DETECTED

## 2023-09-14 ENCOUNTER — Encounter: Payer: Self-pay | Admitting: Family Medicine

## 2023-09-14 ENCOUNTER — Telehealth: Payer: Self-pay

## 2023-09-14 DIAGNOSIS — J209 Acute bronchitis, unspecified: Secondary | ICD-10-CM | POA: Insufficient documentation

## 2023-09-14 NOTE — Assessment & Plan Note (Addendum)
 Augmentin  and tessalon  perles prescribed. CXR on day of visit reports possible pneumonia , will directly send report to his PCP , as he will need close f/u Pt  has been made aware of the dx and the importance of compliance with treatment plan, ED if worsens Test for covid , flu and RSV

## 2023-09-14 NOTE — Telephone Encounter (Signed)
 Copied from CRM [id number]. Topic: Clinical - Lab/Test Results >> Sep 14, 2023 10:52 AM Ivette P wrote: Reason for CRM: pt wants a call to go over lab results and has some questions about tests. Requesting call back at [phone]

## 2023-09-14 NOTE — Addendum Note (Signed)
 Addended by: Kerri Perches on: 09/14/2023 03:57 PM   Modules accepted: Level of Service

## 2023-09-14 NOTE — Assessment & Plan Note (Signed)
 Markedly elevated at visit, will need close f/u with PCP with his meds. He is already on high doses of 3 different anti hypertensives

## 2023-09-14 NOTE — Assessment & Plan Note (Signed)
No s/s of decompensation

## 2023-09-14 NOTE — Progress Notes (Signed)
   James SCHWEGLER     MRN: [id number]      DOB: Jan 06, 1946  Chief Complaint  Patient presents with   Cough    Cough congestion runny nose x 1 week  worse at night can't sleep     HPI [name] is here with 1 week h/o progressive head and chest congestion, with increased SOB  Recently hospitalized at [facility]  Concerned that he may have or may get pneumonia. Still a smoker  ROS See HPI  Denies chest pains, palpitations , PND , orthopnea and leg swelling Sttaes BP has been recently well controlled while in hospital, surprised thatit is as high as it is, does not have meds with him and unable to accurately provide meds and doses.  Denies abdominal pain, nausea, vomiting,diarrhea or constipation.    PE  BP (!) 180/80   Pulse 68   Ht 5' 10 (1.778 m)   Wt 215 lb (97.5 kg)   SpO2 90%   BMI 30.85 kg/m   Patient alert and oriented and in mild  cardiopulmonary distress.  HEENT: No facial asymmetry, EOMI,     Neck supple .  Chest: decreased air entry , no wheezes ,  bronchial breath sounds in right base CVS: S1, S2 no murmurs, no S3.Regular rate.  ABD: Soft non tender.   Ext: No edema  MS: Adequate ROM spine, shoulders, hips and knees.  Skin: Intact, no ulcerations or rash noted.  Psych: Good eye contact, normal affect. Memory intact mildly  anxious not  depressed appearing.  CNS: CN 2-12 intact, no focal deficits noted.   Assessment & Plan  Acute bronchitis Augmentin  and tessalon  perles prescribed. CXR on day of visit reports possible pneumonia , will directly send report to his PCP , as he will need close f/u Pt  has been made aware of the dx and the importance of compliance with treatment plan, ED if worsens Test for covid , flu and RSV  Essential (primary) hypertension Markedly elevated at visit, will need close f/u with PCP with his meds. He is already on high doses of 3 different anti hypertensives  Heart failure with reduced ejection fraction (HCC) No s/s of  decompensation

## 2023-09-15 ENCOUNTER — Encounter: Payer: Self-pay | Admitting: Family Medicine

## 2023-09-15 ENCOUNTER — Ambulatory Visit: Payer: No Typology Code available for payment source | Admitting: Family Medicine

## 2023-09-15 VITALS — BP 180/92 | HR 69 | Temp 97.8°F | Ht 70.0 in | Wt 204.0 lb

## 2023-09-15 DIAGNOSIS — R2681 Unsteadiness on feet: Secondary | ICD-10-CM

## 2023-09-15 DIAGNOSIS — D509 Iron deficiency anemia, unspecified: Secondary | ICD-10-CM

## 2023-09-15 DIAGNOSIS — J189 Pneumonia, unspecified organism: Secondary | ICD-10-CM | POA: Diagnosis not present

## 2023-09-15 DIAGNOSIS — I10 Essential (primary) hypertension: Secondary | ICD-10-CM

## 2023-09-15 MED ORDER — HYDROCHLOROTHIAZIDE 25 MG PO TABS: 25.0000 mg | ORAL_TABLET | Freq: Every day | ORAL | 3 refills | Status: AC

## 2023-09-15 NOTE — Progress Notes (Signed)
 Subjective:    Patient ID: James Fletcher, male    DOB: [birth year], 78 y.o.   MRN: [id number]  Several issues.  First, the patient recently saw Dr. Antonetta in [place] and was diagnosed with right lower lobe pneumonia.  He was treated with Augmentin .  Was roughly on January 2.  Breathing is improved.  He denies any explained that he continues to report feeling weak and tired but he denies any fevers or chills.  His lungs today are clear on examination.  I do not appreciate any crackles in his right lower lobe.  Therefore the pneumonia seems to be improving.  However at her office, his blood pressure was extremely high.  I have verified this today myself.  I got 180/92 in both arms.  Patient is currently on hydralazine  100 mg 3 times daily, carvedilol 2.5 mg twice daily, spironolactone, and valsartan  320 mg daily.  He is on this due to congestive heart failure.  He is not currently taking hydrochlorothiazide .  Third, the patient was recently found to be profoundly anemic with a hemoglobin around 8.  He was fecal occult negative for blood but his iron level was extremely low at 11 has been taking ferrous sulfate approximately 1 month.  He is due to recheck that.  Patient continues to have a difficult time walking.  He has a slow shuffling gait and he has to hold on to surfaces to help maintain his balance. Past Medical History:  Diagnosis Date   Cholecystitis    Left bundle-branch block 02/26/2012   Neuropathy 02/26/2012   Peripheral neuropathy    Past Surgical History:  Procedure Laterality Date   APPENDECTOMY     CHOLECYSTECTOMY N/A 05/01/2014   Procedure: LAPAROSCOPIC CHOLECYSTECTOMY;  Surgeon: Dann FORBES Hummer, MD;  Location: [facility] OR;  Service: General;  Laterality: N/A;   HERNIA REPAIR     Current Outpatient Medications on File Prior to Visit  Medication Sig Dispense Refill   amoxicillin -clavulanate (AUGMENTIN ) 875-125 MG tablet Take 1 tablet by mouth 2 (two) times daily. 20 tablet 0    aspirin  81 MG tablet Take 81 mg by mouth daily.     atorvastatin (LIPITOR) 80 MG tablet Take 80 mg by mouth daily.     benzonatate  (TESSALON ) 100 MG capsule Take 1 capsule (100 mg total) by mouth 2 (two) times daily as needed for cough. 20 capsule 0   bisacodyl (DULCOLAX) 5 MG EC tablet Take 1 tablet by mouth daily.     Calcium Carb-Cholecalciferol 600-10 MG-MCG TABS Take 1 tablet by mouth daily.     carvedilol (COREG) 12.5 MG tablet Take 37.5 mg by mouth 2 (two) times daily with a meal.     diazepam  (VALIUM ) 2 MG tablet Take 2 mg by mouth every 6 (six) hours as needed for anxiety.     diclofenac Sodium (VOLTAREN) 1 % GEL Apply 4 g topically 4 (four) times daily.     DULoxetine (CYMBALTA) 60 MG capsule Take 60 mg by mouth daily.     finasteride (PROSCAR) 5 MG tablet TAKE ONE TABLET BY MOUTH EVERY DAY FOR PROSTATE. TAKE WITH TAMSULOSIN .     fluticasone  (FLONASE ) 50 MCG/ACT nasal spray INSTILL 2 SPRAYS INTO EACH NOSTRIL EVERY DAY ALLERGIES     gabapentin  (NEURONTIN ) 400 MG capsule Take 800 mg by mouth 3 (three) times daily.     hydrALAZINE  (APRESOLINE ) 100 MG tablet Take 100 mg by mouth every 8 (eight) hours.     melatonin 3 MG TABS tablet  Take 9 mg by mouth at bedtime.     meloxicam (MOBIC) 15 MG tablet Take 15 mg by mouth daily as needed for pain.     methocarbamol (ROBAXIN) 500 MG tablet TAKE TWO TABLETS BY MOUTH TWO TIMES A DAY AS NEEDED FOR MUSCLE SPASMS     Multiple Vitamins-Minerals (MULTIVITAMIN WITH MINERALS) tablet Take 1 tablet by mouth daily.     oxyCODONE -acetaminophen  (PERCOCET/ROXICET) 5-325 MG tablet Take 1 tablet by mouth every 4 (four) hours as needed for severe pain (pain score 7-10). 30 tablet 0   pantoprazole  (PROTONIX ) 40 MG tablet Take 1 tablet (40 mg total) by mouth daily. 30 tablet 3   polyethylene glycol (MIRALAX / GLYCOLAX) 17 g packet Take 17 g by mouth daily.     sodium chloride  (OCEAN) 0.65 % SOLN nasal spray Place 1 spray into both nostrils as needed for congestion.  88 mL 0   spironolactone (ALDACTONE) 25 MG tablet Take 25 mg by mouth daily.     tamsulosin  (FLOMAX ) 0.4 MG CAPS capsule Take 0.8 mg by mouth.     valsartan  (DIOVAN ) 160 MG tablet TAKE 1 TABLET BY MOUTH ONCE DAILY .  THIS  REPLACES  LOSARTAN  (Patient taking differently: Take 320 mg by mouth daily.) 30 tablet 0   VITAMIN D, CHOLECALCIFEROL, PO Take 1 tablet by mouth daily.     zinc gluconate 50 MG tablet Take 50 mg by mouth daily.     No current facility-administered medications on file prior to visit.   No Known Allergies Social History   Socioeconomic History   Marital status: Married    Spouse name: Candis   Number of children: 2   Years of education: Not on file   Highest education level: Not on file  Occupational History   Not on file  Tobacco Use   Smoking status: Every Day    Current packs/day: 1.00    Average packs/day: 1 pack/day for 30.0 years (30.0 ttl pk-yrs)    Types: Cigarettes   Smokeless tobacco: Never  Substance and Sexual Activity   Alcohol use: No   Drug use: No   Sexual activity: Not on file  Other Topics Concern   Not on file  Social History Narrative   1 son and 1 daughter.   Social Drivers of Corporate Investment Banker Strain: Low Risk  (10/11/2021)   Overall Financial Resource Strain (CARDIA)    Difficulty of Paying Living Expenses: Not hard at all  Food Insecurity: No Food Insecurity (10/11/2021)   Hunger Vital Sign    Worried About Running Out of Food in the Last Year: Never true    Ran Out of Food in the Last Year: Never true  Transportation Needs: No Transportation Needs (07/11/2023)   Received from [facility] - [facility]    In the past 12 months, has lack of transportation kept you from medical appointments or from getting medications?: No    Lack of Transportation (Non-Medical): No  Physical Activity: Inactive (10/11/2021)   Exercise Vital Sign    Days of Exercise per Week: 0 days    Minutes of Exercise per  Session: 0 min  Stress: Stress Concern Present (10/11/2021)   [employer] of Occupational Health - Occupational Stress Questionnaire    Feeling of Stress : To some extent  Social Connections: Socially Integrated (10/11/2021)   Social Connection and Isolation Panel [NHANES]    Frequency of Communication with Friends and Family: More than three times  a week    Frequency of Social Gatherings with Friends and Family: More than three times a week    Attends Religious Services: 1 to 4 times per year    Active Member of [employer] or Organizations: No    Attends Banker Meetings: 1 to 4 times per year    Marital Status: Married  Catering Manager Violence: Not At Risk (10/11/2021)   Humiliation, Afraid, Rape, and Kick questionnaire    Fear of Current or Ex-Partner: No    Emotionally Abused: No    Physically Abused: No    Sexually Abused: No      Review of Systems  Musculoskeletal:  Positive for back pain.  All other systems reviewed and are negative.      Objective:   Physical Exam Vitals reviewed.  Constitutional:      General: He is not in acute distress.    Appearance: He is not ill-appearing or toxic-appearing.  Cardiovascular:     Rate and Rhythm: Normal rate and regular rhythm.     Pulses: Normal pulses.     Heart sounds: Normal heart sounds.  Pulmonary:     Effort: Pulmonary effort is normal. No respiratory distress.     Breath sounds: No stridor or decreased air movement. No decreased breath sounds, wheezing, rhonchi or rales.  Chest:     Chest wall: No tenderness.  Abdominal:     General: Bowel sounds are normal. There is no distension.     Palpations: Abdomen is soft.     Tenderness: There is no abdominal tenderness. There is no guarding.  Musculoskeletal:     Lumbar back: Tenderness and bony tenderness present. Decreased range of motion.     Right lower leg: No edema.     Left lower leg: No edema.  Neurological:     General: No focal deficit present.      Mental Status: He is alert and oriented to person, place, and time.     Cranial Nerves: No cranial nerve deficit.     Sensory: No sensory deficit.     Motor: No weakness.     Coordination: Coordination normal.     Gait: Gait normal.           Assessment & Plan:  Iron deficiency anemia, unspecified iron deficiency anemia type - Plan: CBC with Differential/Platelet, Iron, TIBC and Ferritin Panel  Pneumonia of right lower lobe due to infectious organism - Plan: DG Chest 2 View  Benign essential HTN  Unsteady gait when walking - Plan: Ambulatory referral to Physical Therapy First, clinically the pneumonia appears to resolving.  Complete Augmentin .  Repeat chest x-ray in 1 month to ensure resolution.  Second, I will recheck a CBC and an iron level today.  If iron remains low and CBC is not improving I would recommend GI consult to evaluate for an occult bleed as well as hematology consult for possible iron infusion.  Third, his blood pressure is extremely high today.  I will verify this in both arms.  Begin hydrochlorothiazide  25 mg daily and recheck blood pressure in the office in 1 to 2 weeks.  Fourth, he has an unsteady gait with walking, recommend a referral to physical therapy to help reduce his risk of falls.

## 2023-09-16 LAB — CBC WITH DIFFERENTIAL/PLATELET
Absolute Lymphocytes: 1029 {cells}/uL (ref 850–3900)
Absolute Monocytes: 628 {cells}/uL (ref 200–950)
Basophils Absolute: 73 {cells}/uL (ref 0–200)
Basophils Relative: 1 %
Eosinophils Absolute: 131 {cells}/uL (ref 15–500)
Eosinophils Relative: 1.8 %
HCT: 31.1 % — ABNORMAL LOW (ref 38.5–50.0)
Hemoglobin: 9.8 g/dL — ABNORMAL LOW (ref 13.2–17.1)
MCH: 25.7 pg — ABNORMAL LOW (ref 27.0–33.0)
MCHC: 31.5 g/dL — ABNORMAL LOW (ref 32.0–36.0)
MCV: 81.4 fL (ref 80.0–100.0)
MPV: 10.1 fL (ref 7.5–12.5)
Monocytes Relative: 8.6 %
Neutro Abs: 5439 {cells}/uL (ref 1500–7800)
Neutrophils Relative %: 74.5 %
Platelets: 249 10*3/uL (ref 140–400)
RBC: 3.82 10*6/uL — ABNORMAL LOW (ref 4.20–5.80)
RDW: 18.5 % — ABNORMAL HIGH (ref 11.0–15.0)
Total Lymphocyte: 14.1 %
WBC: 7.3 10*3/uL (ref 3.8–10.8)

## 2023-09-16 LAB — IRON,TIBC AND FERRITIN PANEL
%SAT: 9 % — ABNORMAL LOW (ref 20–48)
Ferritin: 31 ng/mL (ref 24–380)
Iron: 30 ug/dL — ABNORMAL LOW (ref 50–180)
TIBC: 350 ug/dL (ref 250–425)

## 2023-09-18 ENCOUNTER — Ambulatory Visit: Payer: No Typology Code available for payment source | Admitting: Family Medicine

## 2023-09-24 ENCOUNTER — Other Ambulatory Visit: Payer: Self-pay | Admitting: Family Medicine

## 2023-09-24 MED ORDER — BENZONATATE 100 MG PO CAPS: 100.0000 mg | ORAL_CAPSULE | Freq: Two times a day (BID) | ORAL | 0 refills | Status: DC | PRN

## 2023-09-24 NOTE — Telephone Encounter (Signed)
Copied from CRM [id number]. Topic: Clinical - Medication Refill >> Sep 24, 2023  3:22 PM Eunice Blase wrote: Most Recent Primary Care Visit:  Provider: Lynnea Ferrier T  Department: [facility]  Visit Type: OFFICE VISIT  Date: 09/15/2023  Medication: ***  Has the patient contacted their pharmacy?  (Agent: If no, request that the patient contact the pharmacy for the refill. If patient does not wish to contact the pharmacy document the reason why and proceed with request.) (Agent: If yes, when and what did the pharmacy advise?)  Is this the correct pharmacy for this prescription?  If no, delete pharmacy and type the correct one.  This is the patient's preferred pharmacy:  [facility] [address], [place] - [address] [place] [address] Phone: [phone] Fax: [phone]   Has the prescription been filled recently?   Is the patient out of the medication?   Has the patient been seen for an appointment in the last year OR does the patient have an upcoming appointment?   Can we respond through MyChart?   Agent: Please be advised that Rx refills may take up to 3 business days. We ask that you follow-up with your pharmacy.

## 2023-09-24 NOTE — Telephone Encounter (Signed)
Copied from CRM [id number]. Topic: Clinical - Medication Refill >> Sep 24, 2023  3:25 PM Eunice Blase wrote: Most Recent Primary Care Visit:  Provider: Lynnea Ferrier T  Department: [facility]  Visit Type: OFFICE VISIT  Date: 09/15/2023  Medication: ***  Has the patient contacted their pharmacy?  (Agent: If no, request that the patient contact the pharmacy for the refill. If patient does not wish to contact the pharmacy document the reason why and proceed with request.) (Agent: If yes, when and what did the pharmacy advise?)  Is this the correct pharmacy for this prescription?  If no, delete pharmacy and type the correct one.  This is the patient's preferred pharmacy:  [facility] [address] [place] - [address] [place] [address] Phone: [phone] Fax: [phone]   Has the prescription been filled recently?   Is the patient out of the medication?   Has the patient been seen for an appointment in the last year OR does the patient have an upcoming appointment?   Can we respond through MyChart?   Agent: Please be advised that Rx refills may take up to 3 business days. We ask that you follow-up with your pharmacy.

## 2023-09-29 ENCOUNTER — Ambulatory Visit (INDEPENDENT_AMBULATORY_CARE_PROVIDER_SITE_OTHER): Payer: Medicare Other | Admitting: Family Medicine

## 2023-09-29 ENCOUNTER — Encounter: Payer: Self-pay | Admitting: Family Medicine

## 2023-09-29 VITALS — BP 136/82 | HR 68 | Temp 97.7°F | Ht 70.0 in | Wt 200.0 lb

## 2023-09-29 DIAGNOSIS — D509 Iron deficiency anemia, unspecified: Secondary | ICD-10-CM | POA: Diagnosis not present

## 2023-09-29 DIAGNOSIS — R300 Dysuria: Secondary | ICD-10-CM

## 2023-09-29 MED ORDER — CIPROFLOXACIN HCL 500 MG PO TABS: 500.0000 mg | ORAL_TABLET | Freq: Two times a day (BID) | ORAL | 0 refills | Status: AC

## 2023-09-29 NOTE — Progress Notes (Signed)
Subjective:    Patient ID: James Fletcher, male    DOB: 1946-05-22, 78 y.o.   MRN: [id number] 09/15/23 Several issues.  First, the patient recently saw Dr. Lodema Hong in [place] and was diagnosed with right lower lobe pneumonia.  He was treated with Augmentin.  Was roughly on January 2.  Breathing is improved.  He denies any explained that he continues to report feeling weak and tired but he denies any fevers or chills.  His lungs today are clear on examination.  I do not appreciate any crackles in his right lower lobe.  Therefore the pneumonia seems to be improving.  However at her office, his blood pressure was extremely high.  I have verified this today myself.  I got 180/92 in both arms.  Patient is currently on hydralazine 100 mg 3 times daily, carvedilol 2.5 mg twice daily, spironolactone, and valsartan 320 mg daily.  He is on this due to congestive heart failure.  He is not currently taking hydrochlorothiazide.  Third, the patient was recently found to be profoundly anemic with a hemoglobin around 8.  He was fecal occult negative for blood but his iron level was extremely low at 11 has been taking ferrous sulfate approximately 1 month.  He is due to recheck that.  Patient continues to have a difficult time walking.  He has a slow shuffling gait and he has to hold on to surfaces to help maintain his balance.  At that time, my plan was: First, clinically the pneumonia appears to resolving.  Complete Augmentin.  Repeat chest x-ray in 1 month to ensure resolution. Second, I will recheck a CBC and an iron level today.  If iron remains low and CBC is not improving I would recommend GI consult to evaluate for an occult bleed as well as hematology consult for possible iron infusion. Third, his blood pressure is extremely high today.  I will verify this in both arms.  Begin hydrochlorothiazide 25 mg daily and recheck blood pressure in the office in 1 to 2 weeks. Fourth, he has an unsteady gait with walking,  recommend a referral to physical therapy to help reduce his risk of falls.  09/29/23 Patient's blood pressure has improved dramatically since the addition of hydrochlorothiazide.  He denies any chest pain or shortness of breath.  His hemoglobin had improved from 8.4-9.8.  He is still taking his iron pill.  He would like to recheck that today.  However he presents complaining of dysuria he states that for the last 2 weeks he been dealing with dysuria urgency and frequency.  He states that it burns when he starts to pee.  He states that he feels like he has to urinate and then he experiences a weak stream.  Symptoms suggest a urinary tract infection. Past Medical History:  Diagnosis Date   Cholecystitis    Left bundle-branch block 02/26/2012   Neuropathy 02/26/2012   Peripheral neuropathy    Past Surgical History:  Procedure Laterality Date   APPENDECTOMY     CHOLECYSTECTOMY N/A 05/01/2014   Procedure: LAPAROSCOPIC CHOLECYSTECTOMY;  Surgeon: Liz Malady, MD;  Location: [facility] OR;  Service: General;  Laterality: N/A;   HERNIA REPAIR     Current Outpatient Medications on File Prior to Visit  Medication Sig Dispense Refill   amoxicillin-clavulanate (AUGMENTIN) 875-125 MG tablet Take 1 tablet by mouth 2 (two) times daily. 20 tablet 0   aspirin 81 MG tablet Take 81 mg by mouth daily.     atorvastatin (  LIPITOR) 80 MG tablet Take 80 mg by mouth daily.     benzonatate (TESSALON) 100 MG capsule Take 1 capsule (100 mg total) by mouth 2 (two) times daily as needed for cough. 20 capsule 0   bisacodyl (DULCOLAX) 5 MG EC tablet Take 1 tablet by mouth daily.     Calcium Carb-Cholecalciferol 600-10 MG-MCG TABS Take 1 tablet by mouth daily.     carvedilol (COREG) 12.5 MG tablet Take 37.5 mg by mouth 2 (two) times daily with a meal.     diazepam (VALIUM) 2 MG tablet Take 2 mg by mouth every 6 (six) hours as needed for anxiety.     diclofenac Sodium (VOLTAREN) 1 % GEL Apply 4 g topically 4 (four) times daily.      DULoxetine (CYMBALTA) 60 MG capsule Take 60 mg by mouth daily.     finasteride (PROSCAR) 5 MG tablet TAKE ONE TABLET BY MOUTH EVERY DAY FOR PROSTATE. TAKE WITH TAMSULOSIN.     fluticasone (FLONASE) 50 MCG/ACT nasal spray INSTILL 2 SPRAYS INTO EACH NOSTRIL EVERY DAY ALLERGIES     gabapentin (NEURONTIN) 400 MG capsule Take 800 mg by mouth 3 (three) times daily.     hydrALAZINE (APRESOLINE) 100 MG tablet Take 100 mg by mouth every 8 (eight) hours.     hydrochlorothiazide (HYDRODIURIL) 25 MG tablet Take 1 tablet (25 mg total) by mouth daily. 90 tablet 3   melatonin 3 MG TABS tablet Take 9 mg by mouth at bedtime.     meloxicam (MOBIC) 15 MG tablet Take 15 mg by mouth daily as needed for pain.     methocarbamol (ROBAXIN) 500 MG tablet TAKE TWO TABLETS BY MOUTH TWO TIMES A DAY AS NEEDED FOR MUSCLE SPASMS     Multiple Vitamins-Minerals (MULTIVITAMIN WITH MINERALS) tablet Take 1 tablet by mouth daily.     pantoprazole (PROTONIX) 40 MG tablet Take 1 tablet (40 mg total) by mouth daily. 30 tablet 3   polyethylene glycol (MIRALAX / GLYCOLAX) 17 g packet Take 17 g by mouth daily.     sodium chloride (OCEAN) 0.65 % SOLN nasal spray Place 1 spray into both nostrils as needed for congestion. 88 mL 0   spironolactone (ALDACTONE) 25 MG tablet Take 25 mg by mouth daily.     tamsulosin (FLOMAX) 0.4 MG CAPS capsule Take 0.8 mg by mouth.     valsartan (DIOVAN) 160 MG tablet TAKE 1 TABLET BY MOUTH ONCE DAILY .  THIS  REPLACES  LOSARTAN (Patient taking differently: Take 320 mg by mouth daily.) 30 tablet 0   VITAMIN D, CHOLECALCIFEROL, PO Take 1 tablet by mouth daily.     zinc gluconate 50 MG tablet Take 50 mg by mouth daily.     No current facility-administered medications on file prior to visit.   No Known Allergies Social History   Socioeconomic History   Marital status: Married    Spouse name: Aurea Graff   Number of children: 2   Years of education: Not on file   Highest education level: Not on file   Occupational History   Not on file  Tobacco Use   Smoking status: Every Day    Current packs/day: 1.00    Average packs/day: 1 pack/day for 30.0 years (30.0 ttl pk-yrs)    Types: Cigarettes   Smokeless tobacco: Never  Substance and Sexual Activity   Alcohol use: No   Drug use: No   Sexual activity: Not on file  Other Topics Concern   Not on file  Social History Narrative   1 son and 1 daughter.   Social Drivers of Corporate investment banker Strain: Low Risk  (10/11/2021)   Overall Financial Resource Strain (CARDIA)    Difficulty of Paying Living Expenses: Not hard at all  Food Insecurity: No Food Insecurity (10/11/2021)   Hunger Vital Sign    Worried About Running Out of Food in the Last Year: Never true    Ran Out of Food in the Last Year: Never true  Transportation Needs: No Transportation Needs (07/11/2023)   Received from [facility] - Transportation    In the past 12 months, has lack of transportation kept you from medical appointments or from getting medications?: No    Lack of Transportation (Non-Medical): No  Physical Activity: Inactive (10/11/2021)   Exercise Vital Sign    Days of Exercise per Week: 0 days    Minutes of Exercise per Session: 0 min  Stress: Stress Concern Present (10/11/2021)   [employer] of Occupational Health - Occupational Stress Questionnaire    Feeling of Stress : To some extent  Social Connections: Socially Integrated (10/11/2021)   Social Connection and Isolation Panel [NHANES]    Frequency of Communication with Friends and Family: More than three times a week    Frequency of Social Gatherings with Friends and Family: More than three times a week    Attends Religious Services: 1 to 4 times per year    Active Member of [employer] or Organizations: No    Attends Banker Meetings: 1 to 4 times per year    Marital Status: Married  Catering manager Violence: Not At Risk (10/11/2021)   Humiliation, Afraid, Rape,  and Kick questionnaire    Fear of Current or Ex-Partner: No    Emotionally Abused: No    Physically Abused: No    Sexually Abused: No      Review of Systems  Musculoskeletal:  Positive for back pain.  All other systems reviewed and are negative.      Objective:   Physical Exam Vitals reviewed.  Constitutional:      General: He is not in acute distress.    Appearance: He is not ill-appearing or toxic-appearing.  Cardiovascular:     Rate and Rhythm: Normal rate and regular rhythm.     Pulses: Normal pulses.     Heart sounds: Normal heart sounds.  Pulmonary:     Effort: Pulmonary effort is normal. No respiratory distress.     Breath sounds: No stridor or decreased air movement. No decreased breath sounds, wheezing, rhonchi or rales.  Chest:     Chest wall: No tenderness.  Abdominal:     General: Bowel sounds are normal. There is no distension.     Palpations: Abdomen is soft.     Tenderness: There is no abdominal tenderness. There is no guarding.  Musculoskeletal:     Lumbar back: Tenderness and bony tenderness present. Decreased range of motion.     Right lower leg: No edema.     Left lower leg: No edema.  Neurological:     General: No focal deficit present.     Mental Status: He is alert and oriented to person, place, and time.     Cranial Nerves: No cranial nerve deficit.     Sensory: No sensory deficit.     Motor: No weakness.     Coordination: Coordination normal.     Gait: Gait normal.  Assessment & Plan:  Dysuria - Plan: Urinalysis, Routine w reflex microscopic, Urine Culture  Iron deficiency anemia, unspecified iron deficiency anemia type - Plan: CBC with Differential/Platelet Check urinalysis and urine culture.  Treat UTI with Cipro 500 mg twice daily for 10 days as a suspect the patient likely has prostatitis.  While the patient is here today I will repeat a CBC and assuming that hemoglobin is improving I will check it again in 2 months to  ensure he is back to his baseline

## 2023-09-30 LAB — CBC WITH DIFFERENTIAL/PLATELET
Absolute Lymphocytes: 1380 {cells}/uL (ref 850–3900)
Absolute Monocytes: 752 {cells}/uL (ref 200–950)
Basophils Absolute: 83 {cells}/uL (ref 0–200)
Basophils Relative: 1.2 %
Eosinophils Absolute: 242 {cells}/uL (ref 15–500)
Eosinophils Relative: 3.5 %
HCT: 36.3 % — ABNORMAL LOW (ref 38.5–50.0)
Hemoglobin: 11.3 g/dL — ABNORMAL LOW (ref 13.2–17.1)
MCH: 26.2 pg — ABNORMAL LOW (ref 27.0–33.0)
MCHC: 31.1 g/dL — ABNORMAL LOW (ref 32.0–36.0)
MCV: 84.2 fL (ref 80.0–100.0)
MPV: 10.1 fL (ref 7.5–12.5)
Monocytes Relative: 10.9 %
Neutro Abs: 4444 {cells}/uL (ref 1500–7800)
Neutrophils Relative %: 64.4 %
Platelets: 264 10*3/uL (ref 140–400)
RBC: 4.31 10*6/uL (ref 4.20–5.80)
RDW: 19.9 % — ABNORMAL HIGH (ref 11.0–15.0)
Total Lymphocyte: 20 %
WBC: 6.9 10*3/uL (ref 3.8–10.8)

## 2023-10-01 LAB — URINE CULTURE
MICRO NUMBER:: 15984178
Result:: NO GROWTH
SPECIMEN QUALITY:: ADEQUATE

## 2023-10-01 LAB — URINALYSIS, ROUTINE W REFLEX MICROSCOPIC
Bilirubin Urine: NEGATIVE
Glucose, UA: NEGATIVE
Hgb urine dipstick: NEGATIVE
Leukocytes,Ua: NEGATIVE
Nitrite: NEGATIVE
Protein, ur: NEGATIVE
Specific Gravity, Urine: 1.02 (ref 1.001–1.035)
pH: 6 (ref 5.0–8.0)

## 2023-12-15 ENCOUNTER — Ambulatory Visit: Payer: Self-pay

## 2023-12-15 ENCOUNTER — Other Ambulatory Visit: Payer: Self-pay

## 2023-12-15 DIAGNOSIS — R6510 Systemic inflammatory response syndrome (SIRS) of non-infectious origin without acute organ dysfunction: Secondary | ICD-10-CM

## 2023-12-15 DIAGNOSIS — G6289 Other specified polyneuropathies: Secondary | ICD-10-CM

## 2023-12-15 MED ORDER — GABAPENTIN 400 MG PO CAPS: 800.0000 mg | ORAL_CAPSULE | Freq: Three times a day (TID) | ORAL | 0 refills | Status: DC

## 2023-12-15 NOTE — Telephone Encounter (Signed)
  Chief Complaint: 2 day supply of medication Symptoms: out of medication, missed doses Frequency: onset 12/14/23 Pertinent Negatives: Patient denies NA Disposition: [] ED /[] Urgent Care (no appt availability in office) / [] Appointment(In office/virtual)/ []  [place] [facility]/ [] Home Care/ [] Refused Recommended Disposition /[] [place] [facility]/ [x]  Follow-up with PCP Additional Notes:  Spoke with wife Aurea Graff, she is requesting 1-2 day supply of Gabapentin. Typically receives medication refills by [facility] via [facility]. He ran out of Gabapentin 12/14/23, [facility] which [facility] and expected to arrive this evening. Wife Aurea Graff is concerned because she called the post office and they cannot guarantee it will arrive before closing today.  Last dose of Gabapentin was yesterday morning, he missed two doses on 12/14/23, and so far missed one dose today. Requesting one-two day supply of Gabapentin 800mg  TID called in to [facility]. Please follow up with wife Aurea Graff today at [phone].    Copied from CRM [id number]. Topic: Clinical - Red Word Triage >> Dec 15, 2023 11:16 AM Ivette P wrote: [facility] that prompted transfer to Nurse Triage: cold [place] abapentin (NEURONTIN) 400 MG capsule, pt not doing well. Reason for Disposition  Prescription request for new medicine (not a refill)  Protocols used: Medication Refill and Renewal Call-A-AH

## 2024-02-15 ENCOUNTER — Ambulatory Visit: Admitting: Family Medicine

## 2024-02-15 ENCOUNTER — Encounter: Payer: Self-pay | Admitting: Family Medicine

## 2024-02-15 VITALS — BP 140/76 | HR 68 | Ht 70.0 in | Wt 208.1 lb

## 2024-02-15 DIAGNOSIS — Z79899 Other long term (current) drug therapy: Secondary | ICD-10-CM

## 2024-02-15 NOTE — Progress Notes (Signed)
 Subjective:    Patient ID: James Fletcher, male    DOB: 02-23-1946, 78 y.o.   MRN: [id number] Patient feels drunk every morning after he takes his medications.  He denies feeling like he is going to pass out.  He denies feeling cardiac arrhythmias or palpitations.  He denies feeling chest pain or shortness of breath.  He states that 1 hour after he takes medication, his wife states that him walk to the bathroom to get undressed because he feels so drunk dizzy.  Recently was started on carbamazepine  at another physician's office for nerve pain in addition to the muscle relaxers and gabapentin  Past Medical History:  Diagnosis Date   Cholecystitis    Left bundle-branch block 02/26/2012   Neuropathy 02/26/2012   Peripheral neuropathy    Past Surgical History:  Procedure Laterality Date   APPENDECTOMY     CHOLECYSTECTOMY N/A 05/01/2014   Procedure: LAPAROSCOPIC CHOLECYSTECTOMY;  Surgeon: Cloyce Darby, MD;  Location: [facility] OR;  Service: General;  Laterality: N/A;   HERNIA REPAIR     Current Outpatient Medications on File Prior to Visit  Medication Sig Dispense Refill   aspirin  81 MG tablet Take 81 mg by mouth daily.     atorvastatin (LIPITOR) 80 MG tablet Take 80 mg by mouth daily.     Calcium Carb-Cholecalciferol 600-10 MG-MCG TABS Take 1 tablet by mouth daily.     carvedilol (COREG) 12.5 MG tablet Take 37.5 mg by mouth 2 (two) times daily with a meal.     diazepam (VALIUM) 2 MG tablet Take 2 mg by mouth every 6 (six) hours as needed for anxiety.     diclofenac Sodium (VOLTAREN) 1 % GEL Apply 4 g topically 4 (four) times daily.     DULoxetine (CYMBALTA) 60 MG capsule Take 60 mg by mouth daily.     finasteride (PROSCAR) 5 MG tablet TAKE ONE TABLET BY MOUTH EVERY DAY FOR PROSTATE. TAKE WITH TAMSULOSIN .     fluticasone  (FLONASE ) 50 MCG/ACT nasal spray INSTILL 2 SPRAYS INTO EACH NOSTRIL EVERY DAY ALLERGIES     gabapentin  (NEURONTIN ) 400 MG capsule Take 2 capsules (800 mg total) by mouth 3  (three) times daily. 9 capsule 0   hydrALAZINE  (APRESOLINE ) 100 MG tablet Take 100 mg by mouth every 8 (eight) hours.     hydrochlorothiazide  (HYDRODIURIL ) 25 MG tablet Take 1 tablet (25 mg total) by mouth daily. 90 tablet 3   melatonin 3 MG TABS tablet Take 9 mg by mouth at bedtime.     meloxicam (MOBIC) 15 MG tablet Take 15 mg by mouth daily as needed for pain.     methocarbamol (ROBAXIN) 500 MG tablet TAKE TWO TABLETS BY MOUTH TWO TIMES A DAY AS NEEDED FOR MUSCLE SPASMS     Multiple Vitamins-Minerals (MULTIVITAMIN WITH MINERALS) tablet Take 1 tablet by mouth daily.     pantoprazole  (PROTONIX ) 40 MG tablet Take 1 tablet (40 mg total) by mouth daily. 30 tablet 3   polyethylene glycol (MIRALAX / GLYCOLAX) 17 g packet Take 17 g by mouth daily.     sodium chloride  (OCEAN) 0.65 % SOLN nasal spray Place 1 spray into both nostrils as needed for congestion. 88 mL 0   spironolactone (ALDACTONE) 25 MG tablet Take 25 mg by mouth daily.     tamsulosin  (FLOMAX ) 0.4 MG CAPS capsule Take 0.8 mg by mouth.     valsartan  (DIOVAN ) 160 MG tablet TAKE 1 TABLET BY MOUTH ONCE DAILY .  THIS  REPLACES  LOSARTAN  30 tablet 0   VITAMIN D, CHOLECALCIFEROL, PO Take 1 tablet by mouth daily.     zinc gluconate 50 MG tablet Take 50 mg by mouth daily.     No current facility-administered medications on file prior to visit.   No Known Allergies Social History   Socioeconomic History   Marital status: Married    Spouse name: Libby Ree   Number of children: 2   Years of education: Not on file   Highest education level: Not on file  Occupational History   Not on file  Tobacco Use   Smoking status: Every Day    Current packs/day: 1.00    Average packs/day: 1 pack/day for 30.0 years (30.0 ttl pk-yrs)    Types: Cigarettes   Smokeless tobacco: Never  Substance and Sexual Activity   Alcohol use: No   Drug use: No   Sexual activity: Not on file  Other Topics Concern   Not on file  Social History Narrative   1 son and 1  daughter.   Social Drivers of Corporate investment banker Strain: Low Risk  (10/11/2021)   Overall Financial Resource Strain (CARDIA)    Difficulty of Paying Living Expenses: Not hard at all  Food Insecurity: No Food Insecurity (10/11/2021)   Hunger Vital Sign    Worried About Running Out of Food in the Last Year: Never true    Ran Out of Food in the Last Year: Never true  Transportation Needs: No Transportation Needs (07/11/2023)   Received from [facility] - Transportation    In the past 12 months, has lack of transportation kept you from medical appointments or from getting medications?: No    Lack of Transportation (Non-Medical): No  Physical Activity: Inactive (10/11/2021)   Exercise Vital Sign    Days of Exercise per Week: 0 days    Minutes of Exercise per Session: 0 min  Stress: Stress Concern Present (10/11/2021)   [employer] of Occupational Health - Occupational Stress Questionnaire    Feeling of Stress : To some extent  Social Connections: Socially Integrated (10/11/2021)   Social Connection and Isolation Panel [NHANES]    Frequency of Communication with Friends and Family: More than three times a week    Frequency of Social Gatherings with Friends and Family: More than three times a week    Attends Religious Services: 1 to 4 times per year    Active Member of [employer] or Organizations: No    Attends Banker Meetings: 1 to 4 times per year    Marital Status: Married  Catering manager Violence: Not At Risk (10/11/2021)   Humiliation, Afraid, Rape, and Kick questionnaire    Fear of Current or Ex-Partner: No    Emotionally Abused: No    Physically Abused: No    Sexually Abused: No      Review of Systems  Musculoskeletal:  Positive for back pain.  All other systems reviewed and are negative.      Objective:   Physical Exam Vitals reviewed.  Constitutional:      General: He is not in acute distress.    Appearance: He is not  ill-appearing or toxic-appearing.  Cardiovascular:     Rate and Rhythm: Normal rate and regular rhythm.     Pulses: Normal pulses.     Heart sounds: Normal heart sounds.  Pulmonary:     Effort: Pulmonary effort is normal. No respiratory distress.     Breath sounds:  No stridor or decreased air movement. No decreased breath sounds, wheezing, rhonchi or rales.  Chest:     Chest wall: No tenderness.  Abdominal:     General: Bowel sounds are normal. There is no distension.     Palpations: Abdomen is soft.     Tenderness: There is no abdominal tenderness. There is no guarding.  Musculoskeletal:     Lumbar back: Tenderness and bony tenderness present. Decreased range of motion.     Right lower leg: No edema.     Left lower leg: No edema.  Neurological:     General: No focal deficit present.     Mental Status: He is alert and oriented to person, place, and time.     Cranial Nerves: No cranial nerve deficit.     Sensory: No sensory deficit.     Motor: No weakness.     Coordination: Coordination normal.     Gait: Gait normal.           Assessment & Plan:  Polypharmacy I believe the patient is dealing with polypharmacy.  This is either orthostatic hypotension due to blood pressure medication or disequilibrium due to pain medication.  I suspect the latter.  Discontinue gabapentin , he is in pain, and his muscle relaxer for 48 to 72 hours and see if symptoms improve.  If they do, we will need to reduce the dose of gabapentin  and carbamazepine  and muscle relaxer.  If not consider orthostatic hypotension although his blood pressure here today is slightly elevated.  Also consider cardiac arrhythmias however this seems to be tied to his medication.

## 2024-02-16 ENCOUNTER — Ambulatory Visit: Payer: Self-pay

## 2024-02-16 NOTE — Telephone Encounter (Signed)
 Copied From CRM [id number]. Reason for Triage: patient is calling in because the rx that he has been taking is not working and patient has not been aBLE TO SLEEP PLEASE CALL PATIENT BACK HE HAS QUESTIONS AND HE SAYS THAT IT HAS NOTHING  TO DO WITH HIS APPT 02/15/2024  [phone]

## 2024-02-16 NOTE — Telephone Encounter (Signed)
 Reason for Disposition . Requesting medication for sleep ("sleeping pill")  Answer Assessment - Initial Assessment Questions 1. DESCRIPTION: "Tell me about your sleeping problem."      Unable to sleep 2. ONSET: "How long have you been having trouble sleeping?" (e.g., days, weeks, months)     48 hours  4. STRESS: "Is there anything in your life that is making you feel stressed or tense?"     Just different medications that was affecting dizziness  5. PAIN: "Do you have any pain that is keeping you awake?" (e.g., back pain, headache, abdomen pain)     Head congestion  8. OTHER SYMPTOMS: "Do you have any other symptoms?"  (e.g., difficulty breathing)     Sinus drainage that has been running down throat and irritating.  Pt states no medications he is taking is helping with sleep or sinus drainage. Didn't see anything specific for sleep or nasal drainage on med list besides Flonase .  Protocols used: Insomnia-A-AH

## 2024-02-16 NOTE — Telephone Encounter (Signed)
 Nurse returned pt call: no answer and no voicemail came on therefore no message left.  Will route this information to PCP as fyi.

## 2024-02-16 NOTE — Telephone Encounter (Signed)
 Nurse returned pt call: phone rang x1 and then call disconnected

## 2024-02-16 NOTE — Telephone Encounter (Signed)
 Nurse returned pt call: phone rang and a male answered the phone: nurse introduced self to asked for patient then phone disconnected.  Will attempt to call pt again.

## 2024-02-16 NOTE — Telephone Encounter (Signed)
 FYI Only or Action Required?: Action required by provider  Patient was last seen in primary care on 02/15/2024 by Austine Lefort, MD. Called Nurse Triage reporting Insomnia. Symptoms began several days ago. Interventions attempted: Prescription medications: any prescription meds and Rest, hydration, or home remedies. Symptoms are: gradually worsening.  Triage Disposition: Call PCP Now (overriding See PCP When Office is Open (Within 3 Days))  Patient/caregiver understands and will follow disposition?: Yes No appts with PCP, suggested pt could reach out to [place] to be seen but pt preferred to let PCP address this. States he has had this issue before and PCP has addressed and prescribed.    1. DESCRIPTION: "Tell me about your sleeping problem."      Unable to sleep 2. ONSET: "How long have you been having trouble sleeping?" (e.g., days, weeks, months)     48 hours  4. STRESS: "Is there anything in your life that is making you feel stressed or tense?"     Just different medications that was affecting dizziness  5. PAIN: "Do you have any pain that is keeping you awake?" (e.g., back pain, headache, abdomen pain)     Head congestion  8. OTHER SYMPTOMS: "Do you have any other symptoms?"  (e.g., difficulty breathing)     Sinus drainage that has been running down throat and irritating.  Pt states no medications he is taking is helping with sleep or sinus drainage. Didn't see anything specific for sleep or nasal drainage on med list besides Flonase .

## 2024-02-26 ENCOUNTER — Ambulatory Visit: Payer: Self-pay

## 2024-02-26 NOTE — Telephone Encounter (Signed)
 FYI Only or Action Required?: FYI only for provider.  Patient was last seen in primary care on 02/15/2024 by Austine Lefort, MD. Called Nurse Triage reporting Shortness of Breath, Anxiety, and Insomnia. Symptoms began about a month ago. Interventions attempted: Rest, hydration, or home remedies and Dietary changes. Symptoms are: insomnia, anxiety, waking up in a panic like he cannot catch his breath, lower back pain gradually worsening.  Triage Disposition: See PCP When Office is Open (Within 3 Days)  Patient/caregiver understands and will follow disposition?: Yes                               Copied from CRM [id number]. Topic: Clinical - Red Word Triage >> Feb 26, 2024  3:09 PM Stanly Early wrote: Red Word that prompted transfer to Nurse Triage: having slept in two days. Has trouble breathing, he think its his anxiety. Reason for Disposition  [1] Insomnia persists > 1 week AND [2] no improvement after using Care Advice  Answer Assessment - Initial Assessment Questions 1. DESCRIPTION: Tell me about your sleeping problem.      Patient states he has not slept in 2 days. He states he is having anxiety attacks that will make him feel like he can't catch his breath because he wakes up in a panic. He states he is not sure if it is sleep apnea or anxiety that is causing him to feel that way.  2. ONSET: How long have you been having trouble sleeping? (e.g., days, weeks, months)     About a month.  3. RECURRENT: Have you had sleeping problems before?  If Yes, ask: What happened that time? What helped your sleeping problem go away in the past?      Yes, he states over the past month he went about 60 hours without sleep due to his PCP had told him to hold a medication.   4. STRESS: Is there anything in your life that is making you feel stressed or tense?     No. He states he thinks he is trying to go to sleep so hard that it is messing with his anxiety.  5. PAIN:  Do you have any pain that is keeping you awake? (e.g., back pain, headache, abdomen pain)     Lower back stenosis and he states that keeps him awake a lot because most nights he can not get comfortable.  6. CAFFEINE ABUSE: Do you drink caffeinated beverages, and how much each day? (e.g., coffee, tea, colas)     He states he cuts himself off by 1pm, he drinks coffee.  7. ALCOHOL USE OR SUBSTANCE USE (DRUG USE): Do you drink alcohol or use any illegal drugs?     No.  8. OTHER SYMPTOMS: Do you have any other symptoms?  (e.g., difficulty breathing)     Patient denies any difficulty breathing at rest and states he is able to take a deep breath, and denies chest pain.  Protocols used: Insomnia-A-AH

## 2024-02-29 NOTE — Telephone Encounter (Signed)
 No contact made lvm w/ physician request to visit local UC or ED.

## 2024-03-01 ENCOUNTER — Ambulatory Visit (INDEPENDENT_AMBULATORY_CARE_PROVIDER_SITE_OTHER): Admitting: Family Medicine

## 2024-03-01 ENCOUNTER — Encounter: Payer: Self-pay | Admitting: Family Medicine

## 2024-03-01 VITALS — BP 138/62 | HR 65 | Temp 97.2°F | Ht 70.0 in | Wt 208.4 lb

## 2024-03-01 DIAGNOSIS — Z79899 Other long term (current) drug therapy: Secondary | ICD-10-CM | POA: Diagnosis not present

## 2024-03-01 NOTE — Progress Notes (Signed)
 Subjective:    Patient ID: James Fletcher, male    DOB: [birth year], 78 y.o.   MRN: [id number] 02/15/24 Patient feels drunk every morning after he takes his medications.  He denies feeling like he is going to pass out.  He denies feeling cardiac arrhythmias or palpitations.  He denies feeling chest pain or shortness of breath.  He states that 1 hour after he takes medication, his wife states that him walk to the bathroom to get undressed because he feels so drunk/dizzy.  Recently was started on carbamazepine  at another physician's office for nerve pain in addition to the muscle relaxers and gabapentin .  At that time, my plan was: I believe the patient is dealing with polypharmacy.  This is either orthostatic hypotension due to blood pressure from medication or disequilibrium due to pain medication.  I suspect the latter.  Discontinue gabapentin , tegretol , and his muscle relaxer for 48 to 72 hours and see if symptoms improve.  If they do, we will need to reduce the dose of gabapentin  and carbamazepine  and muscle relaxer.  If not consider orthostatic hypotension although his blood pressure here today is slightly elevated.  Also consider cardiac arrhythmias, however this seems to be tied to his medication.  03/01/24 Patient states that he was unable to wean down on the gabapentin .  This exacerbated his anxiety related to sleep.  Therefore he is taking 800 mg of gabapentin  3 times a day.  He is taking 2 methocarbamol in the morning to methocarbamol in the evening.  He is also taking 2 mg of diazepam in the morning on a scheduled basis regardless of whether he feels anxious.  At night he is having a difficult time falling asleep because he feels like he is smothering and he has panic attacks and is extremely anxious.  He is requesting a sleeping pill.  He continues to have dizziness and sedation in the mornings after his medication.  However his blood pressure is normal.  Past Medical History:  Diagnosis Date    Cholecystitis    Left bundle-branch block 02/26/2012   Neuropathy 02/26/2012   Peripheral neuropathy    Past Surgical History:  Procedure Laterality Date   APPENDECTOMY     CHOLECYSTECTOMY N/A 05/01/2014   Procedure: LAPAROSCOPIC CHOLECYSTECTOMY;  Surgeon: Dann FORBES Hummer, MD;  Location: [facility] OR;  Service: General;  Laterality: N/A;   HERNIA REPAIR     Current Outpatient Medications on File Prior to Visit  Medication Sig Dispense Refill   aspirin  81 MG tablet Take 81 mg by mouth daily.     atorvastatin (LIPITOR) 80 MG tablet Take 80 mg by mouth daily.     Calcium Carb-Cholecalciferol 600-10 MG-MCG TABS Take 1 tablet by mouth daily.     carvedilol (COREG) 12.5 MG tablet Take 37.5 mg by mouth 2 (two) times daily with a meal.     diazepam (VALIUM) 2 MG tablet Take 2 mg by mouth every 6 (six) hours as needed for anxiety.     diclofenac Sodium (VOLTAREN) 1 % GEL Apply 4 g topically 4 (four) times daily.     DULoxetine (CYMBALTA) 60 MG capsule Take 60 mg by mouth daily.     finasteride (PROSCAR) 5 MG tablet TAKE ONE TABLET BY MOUTH EVERY DAY FOR PROSTATE. TAKE WITH TAMSULOSIN .     fluticasone  (FLONASE ) 50 MCG/ACT nasal spray INSTILL 2 SPRAYS INTO EACH NOSTRIL EVERY DAY ALLERGIES     gabapentin  (NEURONTIN ) 400 MG capsule Take 2 capsules (800 mg total)  by mouth 3 (three) times daily. 9 capsule 0   hydrALAZINE  (APRESOLINE ) 100 MG tablet Take 100 mg by mouth every 8 (eight) hours.     hydrochlorothiazide  (HYDRODIURIL ) 25 MG tablet Take 1 tablet (25 mg total) by mouth daily. 90 tablet 3   melatonin 3 MG TABS tablet Take 9 mg by mouth at bedtime.     meloxicam (MOBIC) 15 MG tablet Take 15 mg by mouth daily as needed for pain.     methocarbamol (ROBAXIN) 500 MG tablet TAKE TWO TABLETS BY MOUTH TWO TIMES A DAY AS NEEDED FOR MUSCLE SPASMS     Multiple Vitamins-Minerals (MULTIVITAMIN WITH MINERALS) tablet Take 1 tablet by mouth daily.     pantoprazole  (PROTONIX ) 40 MG tablet Take 1 tablet (40 mg total)  by mouth daily. 30 tablet 3   polyethylene glycol (MIRALAX / GLYCOLAX) 17 g packet Take 17 g by mouth daily.     sodium chloride  (OCEAN) 0.65 % SOLN nasal spray Place 1 spray into both nostrils as needed for congestion. 88 mL 0   spironolactone (ALDACTONE) 25 MG tablet Take 25 mg by mouth daily.     tamsulosin  (FLOMAX ) 0.4 MG CAPS capsule Take 0.8 mg by mouth.     valsartan  (DIOVAN ) 160 MG tablet TAKE 1 TABLET BY MOUTH ONCE DAILY .  THIS  REPLACES  LOSARTAN  30 tablet 0   VITAMIN D, CHOLECALCIFEROL, PO Take 1 tablet by mouth daily.     zinc gluconate 50 MG tablet Take 50 mg by mouth daily.     No current facility-administered medications on file prior to visit.   No Known Allergies Social History   Socioeconomic History   Marital status: Married    Spouse name: Candis   Number of children: 2   Years of education: Not on file   Highest education level: Not on file  Occupational History   Not on file  Tobacco Use   Smoking status: Every Day    Current packs/day: 1.00    Average packs/day: 1 pack/day for 30.0 years (30.0 ttl pk-yrs)    Types: Cigarettes   Smokeless tobacco: Never  Substance and Sexual Activity   Alcohol use: No   Drug use: No   Sexual activity: Not on file  Other Topics Concern   Not on file  Social History Narrative   1 son and 1 daughter.   Social Drivers of Corporate investment banker Strain: Low Risk  (10/11/2021)   Overall Financial Resource Strain (CARDIA)    Difficulty of Paying Living Expenses: Not hard at all  Food Insecurity: No Food Insecurity (10/11/2021)   Hunger Vital Sign    Worried About Running Out of Food in the Last Year: Never true    Ran Out of Food in the Last Year: Never true  Transportation Needs: No Transportation Needs (07/11/2023)   Received from [facility] - Transportation    In the past 12 months, has lack of transportation kept you from medical appointments or from getting medications?: No    Lack of  Transportation (Non-Medical): No  Physical Activity: Inactive (10/11/2021)   Exercise Vital Sign    Days of Exercise per Week: 0 days    Minutes of Exercise per Session: 0 min  Stress: Stress Concern Present (10/11/2021)   [employer] of Occupational Health - Occupational Stress Questionnaire    Feeling of Stress : To some extent  Social Connections: Socially Integrated (10/11/2021)   Social Connection and  Isolation Panel    Frequency of Communication with Friends and Family: More than three times a week    Frequency of Social Gatherings with Friends and Family: More than three times a week    Attends Religious Services: 1 to 4 times per year    Active Member of [employer] or Organizations: No    Attends Banker Meetings: 1 to 4 times per year    Marital Status: Married  Catering manager Violence: Not At Risk (10/11/2021)   Humiliation, Afraid, Rape, and Kick questionnaire    Fear of Current or Ex-Partner: No    Emotionally Abused: No    Physically Abused: No    Sexually Abused: No      Review of Systems  Musculoskeletal:  Positive for back pain.  All other systems reviewed and are negative.      Objective:   Physical Exam Vitals reviewed.  Constitutional:      General: He is not in acute distress.    Appearance: He is not ill-appearing or toxic-appearing.   Cardiovascular:     Rate and Rhythm: Normal rate and regular rhythm.     Pulses: Normal pulses.     Heart sounds: Normal heart sounds.  Pulmonary:     Effort: Pulmonary effort is normal. No respiratory distress.     Breath sounds: No stridor or decreased air movement. No decreased breath sounds, wheezing, rhonchi or rales.  Chest:     Chest wall: No tenderness.  Abdominal:     General: Bowel sounds are normal. There is no distension.     Palpations: Abdomen is soft.     Tenderness: There is no abdominal tenderness. There is no guarding.   Musculoskeletal:     Lumbar back: Tenderness and bony tenderness  present. Decreased range of motion.     Right lower leg: No edema.     Left lower leg: No edema.   Neurological:     General: No focal deficit present.     Mental Status: He is alert and oriented to person, place, and time.     Cranial Nerves: No cranial nerve deficit.     Sensory: No sensory deficit.     Motor: No weakness.     Coordination: Coordination normal.     Gait: Gait normal.           Assessment & Plan:  Polypharmacy I spent 30 minutes today with the patient and his wife explaining polypharmacy.  I believe the dizziness and sedation in the morning is coming from the combination of medications he is taking.  If he cannot stop gabapentin  recommend is continuing methocarbamol.  I see reason for take this twice a day every day preventative.  I have also recommended that he stop Valium in the morning.  He is not anxious in the morning.  Instead I recommended that he use between 2 to 4 mg of Valium at night to try to help him sleep and prevent the panic attacks.  I believe that by reducing the medications in the morning he will actually feel better.  I do not want to add additional medication that can cause sedation or dizziness.

## 2024-04-04 ENCOUNTER — Encounter: Payer: Self-pay | Admitting: Family Medicine

## 2024-04-04 ENCOUNTER — Ambulatory Visit: Admitting: Family Medicine

## 2024-04-04 ENCOUNTER — Ambulatory Visit: Payer: Self-pay | Admitting: *Deleted

## 2024-04-04 VITALS — BP 106/50 | HR 56 | Temp 97.8°F | Ht 70.0 in | Wt 208.0 lb

## 2024-04-04 DIAGNOSIS — I952 Hypotension due to drugs: Secondary | ICD-10-CM

## 2024-04-04 MED ORDER — DIAZEPAM 2 MG PO TABS: 2.0000 mg | ORAL_TABLET | Freq: Every evening | ORAL | 1 refills | Status: AC | PRN

## 2024-04-04 NOTE — Telephone Encounter (Signed)
 FYI Only or Action Required?: Action required by provider: request for appointment.  Patient was last seen in primary care on 03/01/2024 by Duanne Butler DASEN, MD.  Called Nurse Triage reporting Insomnia.  Symptoms began today.  Interventions attempted: Nothing.  Symptoms are: gradually worsening.  Triage Disposition: See HCP Within 4 Hours (Or PCP Triage)  Patient/caregiver understands and will follow disposition?: Yes               Copied from CRM #[id number]. Topic: Clinical - Red Word Triage >> Apr 04, 2024 12:42 PM Elle L wrote: Red Word that prompted transfer to Nurse Triage: The patient's wife states the patient is not sleeping at all at night due to pain and anxiety. If he does sleep it is during the day. He is getting extremely fatigued and weak from not being able to sleep. Reason for Disposition  [1] MODERATE weakness or fatigue AND [2] from poor fluid intake AND [3] no improvement after 2 hours of rest and fluids  Answer Assessment - Initial Assessment Questions Appt scheduled today. Recommended to patient's wife if sx worsen go to ED or call 911.     1. DESCRIPTION: Describe how you are feeling.     Weakness, fell to knees today due to weakness per patient wife on DPR  2. SEVERITY: How bad is it?  Can you stand and walk?     Fell knees when walking today due to weakness no dizziness denies injury  3. ONSET: When did these symptoms begin? (e.g., hours, days, weeks, months)     Fell today  4. CAUSE: What do you think is causing the weakness or fatigue? (e.g., not drinking enough fluids, medical problem, trouble sleeping)     Pain and can not sleep. Lays down during the day worsening anxiety.  5. NEW MEDICINES:  Have you started on any new medicines recently? (e.g., opioid pain medicines, benzodiazepines, muscle relaxants, antidepressants, antihistamines, neuroleptics, beta blockers)     Multiple pain meds. 6. OTHER SYMPTOMS: Do you have any other  symptoms? (e.g., chest pain, fever, cough, SOB, vomiting, diarrhea, bleeding, other areas of pain)     Trouble sleeping , anxiety due to pain,  fell to knees  today ,, legs gave out  today after walking poor po intake . 7. PREGNANCY: Is there any chance you are pregnant? When was your last menstrual period?     na  Protocols used: Weakness (Generalized) and Fatigue-A-AH

## 2024-04-04 NOTE — Progress Notes (Signed)
 Subjective:    Patient ID: James Fletcher, male    DOB: 01-16-1946, 78 y.o.   MRN: [id number] 02/15/24 Patient feels drunk every morning after he takes his medications.  He denies feeling like he is going to pass out.  He denies feeling cardiac arrhythmias or palpitations.  He denies feeling chest pain or shortness of breath.  He states that 1 hour after he takes medication, his wife states that him walk to the bathroom to get undressed because he feels so drunk/dizzy.  Recently was started on carbamazepine  at another physician's office for nerve pain in addition to the muscle relaxers and gabapentin .  At that time, my plan was: I believe the patient is dealing with polypharmacy.  This is either orthostatic hypotension due to blood pressure from medication or disequilibrium due to pain medication.  I suspect the latter.  Discontinue gabapentin , tegretol , and his muscle relaxer for 48 to 72 hours and see if symptoms improve.  If they do, we will need to reduce the dose of gabapentin  and carbamazepine  and muscle relaxer.  If not consider orthostatic hypotension although his blood pressure here today is slightly elevated.  Also consider cardiac arrhythmias, however this seems to be tied to his medication.  03/01/24 Patient states that he was unable to wean down on the gabapentin .  This exacerbated his anxiety related to sleep.  Therefore he is taking 800 mg of gabapentin  3 times a day.  He is taking 2 methocarbamol in the morning to methocarbamol in the evening.  He is also taking 2 mg of diazepam  in the morning on a scheduled basis regardless of whether he feels anxious.  At night he is having a difficult time falling asleep because he feels like he is smothering and he has panic attacks and is extremely anxious.  He is requesting a sleeping pill.  He continues to have dizziness and sedation in the mornings after his medication.  However his blood pressure is normal.  At that time, my plan was: I spent 30  minutes today with the patient and his wife explaining polypharmacy.  I believe the dizziness and sedation in the morning is coming from the combination of medications he is taking.  If he cannot stop gabapentin  I recommend discontinuing methocarbamol.  I see no reason for him to take this twice a day every day as a preventative.  I have also recommended that he stop Valium  in the morning.  He is not anxious in the morning.  Instead I recommended that he use between 2 to 4 mg of Valium  at night to try to help him sleep and prevent the panic attacks.  I believe that by reducing the medications in the morning he will actually feel better.  I do not want to add additional medication that can cause sedation or dizziness.  04/04/24 Patient did stop methocarbamol.  However he never started taking Valium  at night.  He is still taking Valium  in the morning out of habit.  He states that he cannot sleep at night.  He is extremely tired during the day but he is able to fall asleep at night.  This morning, the patient felt extremely weak and dizzy and fell and lost his balance.  He did not injure himself but he states that he has no energy.  His blood pressure is extremely low.  He denies any symptoms of infection.  Specifically he denies any dysuria urgency frequency.  He denies any coughing or shortness of breath  or chest pain.  He denies any nausea or vomiting or diarrhea melena or hematochezia.  Past Medical History:  Diagnosis Date   Cholecystitis    Left bundle-branch block 02/26/2012   Neuropathy 02/26/2012   Peripheral neuropathy    Past Surgical History:  Procedure Laterality Date   APPENDECTOMY     CHOLECYSTECTOMY N/A 05/01/2014   Procedure: LAPAROSCOPIC CHOLECYSTECTOMY;  Surgeon: Dann FORBES Hummer, MD;  Location: [facility] OR;  Service: General;  Laterality: N/A;   HERNIA REPAIR     Current Outpatient Medications on File Prior to Visit  Medication Sig Dispense Refill   aspirin  81 MG tablet Take 81 mg by  mouth daily.     atorvastatin (LIPITOR) 80 MG tablet Take 80 mg by mouth daily.     Calcium Carb-Cholecalciferol 600-10 MG-MCG TABS Take 1 tablet by mouth daily.     carvedilol (COREG) 12.5 MG tablet Take 37.5 mg by mouth 2 (two) times daily with a meal.     diazepam  (VALIUM ) 2 MG tablet Take 2 mg by mouth every 6 (six) hours as needed for anxiety.     diclofenac Sodium (VOLTAREN) 1 % GEL Apply 4 g topically 4 (four) times daily.     DULoxetine (CYMBALTA) 60 MG capsule Take 60 mg by mouth daily.     finasteride (PROSCAR) 5 MG tablet TAKE ONE TABLET BY MOUTH EVERY DAY FOR PROSTATE. TAKE WITH TAMSULOSIN .     fluticasone  (FLONASE ) 50 MCG/ACT nasal spray INSTILL 2 SPRAYS INTO EACH NOSTRIL EVERY DAY ALLERGIES     gabapentin  (NEURONTIN ) 400 MG capsule Take 2 capsules (800 mg total) by mouth 3 (three) times daily. 9 capsule 0   hydrALAZINE  (APRESOLINE ) 100 MG tablet Take 100 mg by mouth every 8 (eight) hours.     hydrochlorothiazide  (HYDRODIURIL ) 25 MG tablet Take 1 tablet (25 mg total) by mouth daily. 90 tablet 3   melatonin 3 MG TABS tablet Take 9 mg by mouth at bedtime.     meloxicam (MOBIC) 15 MG tablet Take 15 mg by mouth daily as needed for pain.     methocarbamol (ROBAXIN) 500 MG tablet TAKE TWO TABLETS BY MOUTH TWO TIMES A DAY AS NEEDED FOR MUSCLE SPASMS     Multiple Vitamins-Minerals (MULTIVITAMIN WITH MINERALS) tablet Take 1 tablet by mouth daily.     pantoprazole  (PROTONIX ) 40 MG tablet Take 1 tablet (40 mg total) by mouth daily. 30 tablet 3   polyethylene glycol (MIRALAX / GLYCOLAX) 17 g packet Take 17 g by mouth daily.     sodium chloride  (OCEAN) 0.65 % SOLN nasal spray Place 1 spray into both nostrils as needed for congestion. 88 mL 0   spironolactone (ALDACTONE) 25 MG tablet Take 25 mg by mouth daily.     tamsulosin  (FLOMAX ) 0.4 MG CAPS capsule Take 0.8 mg by mouth.     valsartan  (DIOVAN ) 160 MG tablet TAKE 1 TABLET BY MOUTH ONCE DAILY .  THIS  REPLACES  LOSARTAN  30 tablet 0   VITAMIN  D, CHOLECALCIFEROL, PO Take 1 tablet by mouth daily.     zinc gluconate 50 MG tablet Take 50 mg by mouth daily.     No current facility-administered medications on file prior to visit.   No Known Allergies Social History   Socioeconomic History   Marital status: Married    Spouse name: Candis   Number of children: 2   Years of education: Not on file   Highest education level: Not on file  Occupational History  Not on file  Tobacco Use   Smoking status: Every Day    Current packs/day: 1.00    Average packs/day: 1 pack/day for 30.0 years (30.0 ttl pk-yrs)    Types: Cigarettes   Smokeless tobacco: Never  Vaping Use   Vaping status: Never Used  Substance and Sexual Activity   Alcohol use: No   Drug use: No   Sexual activity: Not on file  Other Topics Concern   Not on file  Social History Narrative   1 son and 1 daughter.   Social Drivers of Corporate investment banker Strain: Low Risk  (10/11/2021)   Overall Financial Resource Strain (CARDIA)    Difficulty of Paying Living Expenses: Not hard at all  Food Insecurity: No Food Insecurity (10/11/2021)   Hunger Vital Sign    Worried About Running Out of Food in the Last Year: Never true    Ran Out of Food in the Last Year: Never true  Transportation Needs: No Transportation Needs (07/11/2023)   Received from [facility] - Transportation    In the past 12 months, has lack of transportation kept you from medical appointments or from getting medications?: No    Lack of Transportation (Non-Medical): No  Physical Activity: Inactive (10/11/2021)   Exercise Vital Sign    Days of Exercise per Week: 0 days    Minutes of Exercise per Session: 0 min  Stress: Stress Concern Present (10/11/2021)   [employer] of Occupational Health - Occupational Stress Questionnaire    Feeling of Stress : To some extent  Social Connections: Socially Integrated (10/11/2021)   Social Connection and Isolation Panel    Frequency  of Communication with Friends and Family: More than three times a week    Frequency of Social Gatherings with Friends and Family: More than three times a week    Attends Religious Services: 1 to 4 times per year    Active Member of [employer] or Organizations: No    Attends Banker Meetings: 1 to 4 times per year    Marital Status: Married  Catering manager Violence: Not At Risk (10/11/2021)   Humiliation, Afraid, Rape, and Kick questionnaire    Fear of Current or Ex-Partner: No    Emotionally Abused: No    Physically Abused: No    Sexually Abused: No      Review of Systems  Musculoskeletal:  Positive for back pain.  All other systems reviewed and are negative.      Objective:   Physical Exam Vitals reviewed.  Constitutional:      General: He is not in acute distress.    Appearance: He is not ill-appearing or toxic-appearing.  Cardiovascular:     Rate and Rhythm: Normal rate and regular rhythm.     Pulses: Normal pulses.     Heart sounds: Normal heart sounds.  Pulmonary:     Effort: Pulmonary effort is normal. No respiratory distress.     Breath sounds: No stridor or decreased air movement. No decreased breath sounds, wheezing, rhonchi or rales.  Chest:     Chest wall: No tenderness.  Abdominal:     General: Bowel sounds are normal. There is no distension.     Palpations: Abdomen is soft.     Tenderness: There is no abdominal tenderness. There is no guarding.  Musculoskeletal:     Lumbar back: Tenderness and bony tenderness present. Decreased range of motion.     Right lower leg: No  edema.     Left lower leg: No edema.  Neurological:     General: No focal deficit present.     Mental Status: He is alert and oriented to person, place, and time.     Cranial Nerves: No cranial nerve deficit.     Sensory: No sensory deficit.     Motor: No weakness.     Coordination: Coordination normal.     Gait: Gait normal.           Assessment & Plan:  Hypotension due  to drugs I believe the patient feels weak due to hypotension.  I recommended that he hold carvedilol and hydralazine  tonight.  I recommended that he discontinue hydralazine  altogether.  I recommended that his wife go through all of his medications with him and put them in a pillbox to ensure that they are being properly taken.  I am concerned that he may be getting confused and taking the medicine accidentally wrong.  We will monitor his blood pressure over the next week and see if his blood pressure improves.  Recommended discontinuation of Valium  in the morning.  Instead, take Valium  at night 2 mg to 4 mg as needed for insomnia.  Recheck in 1 week.

## 2024-04-29 ENCOUNTER — Ambulatory Visit (INDEPENDENT_AMBULATORY_CARE_PROVIDER_SITE_OTHER): Admitting: Family Medicine

## 2024-04-29 ENCOUNTER — Encounter: Payer: Self-pay | Admitting: Family Medicine

## 2024-04-29 VITALS — BP 160/70 | HR 56 | Ht 70.0 in | Wt 213.4 lb

## 2024-04-29 DIAGNOSIS — Z79899 Other long term (current) drug therapy: Secondary | ICD-10-CM | POA: Diagnosis not present

## 2024-04-29 DIAGNOSIS — Z7729 Contact with and (suspected ) exposure to other hazardous substances: Secondary | ICD-10-CM | POA: Insufficient documentation

## 2024-04-29 DIAGNOSIS — F411 Generalized anxiety disorder: Secondary | ICD-10-CM | POA: Insufficient documentation

## 2024-04-29 DIAGNOSIS — Z85828 Personal history of other malignant neoplasm of skin: Secondary | ICD-10-CM | POA: Insufficient documentation

## 2024-04-29 DIAGNOSIS — F5101 Primary insomnia: Secondary | ICD-10-CM | POA: Diagnosis not present

## 2024-04-29 DIAGNOSIS — R269 Unspecified abnormalities of gait and mobility: Secondary | ICD-10-CM | POA: Insufficient documentation

## 2024-04-29 DIAGNOSIS — M542 Cervicalgia: Secondary | ICD-10-CM | POA: Insufficient documentation

## 2024-04-29 DIAGNOSIS — I10 Essential (primary) hypertension: Secondary | ICD-10-CM

## 2024-04-29 DIAGNOSIS — K5903 Drug induced constipation: Secondary | ICD-10-CM | POA: Insufficient documentation

## 2024-04-29 DIAGNOSIS — W01198A Fall on same level from slipping, tripping and stumbling with subsequent striking against other object, initial encounter: Secondary | ICD-10-CM | POA: Insufficient documentation

## 2024-04-29 MED ORDER — DOXEPIN HCL 6 MG PO TABS: 6.0000 mg | ORAL_TABLET | Freq: Every evening | ORAL | 2 refills | Status: DC | PRN

## 2024-04-29 MED ORDER — VALSARTAN 320 MG PO TABS
320.0000 mg | ORAL_TABLET | Freq: Every day | ORAL | 3 refills | Status: AC
Start: 2024-04-29 — End: ?

## 2024-04-29 NOTE — Progress Notes (Signed)
 Subjective:    Patient ID: James Fletcher, male    DOB: 1945/12/28, 78 y.o.   MRN: [id number] 02/15/24 Patient feels drunk every morning after he takes his medications.  He denies feeling like he is going to pass out.  He denies feeling cardiac arrhythmias or palpitations.  He denies feeling chest pain or shortness of breath.  He states that 1 hour after he takes medication, his wife states that him walk to the bathroom to get undressed because he feels so drunk/dizzy.  Recently was started on carbamazepine  at another physician's office for nerve pain in addition to the muscle relaxers and gabapentin .  At that time, my plan was: I believe the patient is dealing with polypharmacy.  This is either orthostatic hypotension due to blood pressure from medication or disequilibrium due to pain medication.  I suspect the latter.  Discontinue gabapentin , tegretol , and his muscle relaxer for 48 to 72 hours and see if symptoms improve.  If they do, we will need to reduce the dose of gabapentin  and carbamazepine  and muscle relaxer.  If not consider orthostatic hypotension although his blood pressure here today is slightly elevated.  Also consider cardiac arrhythmias, however this seems to be tied to his medication.  03/01/24 Patient states that he was unable to wean down on the gabapentin .  This exacerbated his anxiety related to sleep.  Therefore he is taking 800 mg of gabapentin  3 times a day.  He is taking 2 methocarbamol in the morning to methocarbamol in the evening.  He is also taking 2 mg of diazepam  in the morning on a scheduled basis regardless of whether he feels anxious.  At night he is having a difficult time falling asleep because he feels like he is smothering and he has panic attacks and is extremely anxious.  He is requesting a sleeping pill.  He continues to have dizziness and sedation in the mornings after his medication.  However his blood pressure is normal.  At that time, my plan was: I spent 30  minutes today with the patient and his wife explaining polypharmacy.  I believe the dizziness and sedation in the morning is coming from the combination of medications he is taking.  If he cannot stop gabapentin  I recommend discontinuing methocarbamol.  I see no reason for him to take this twice a day every day as a preventative.  I have also recommended that he stop Valium  in the morning.  He is not anxious in the morning.  Instead I recommended that he use between 2 to 4 mg of Valium  at night to try to help him sleep and prevent the panic attacks.  I believe that by reducing the medications in the morning he will actually feel better.  I do not want to add additional medication that can cause sedation or dizziness.  04/04/24 Patient did stop methocarbamol.  However he never started taking Valium  at night.  He is still taking Valium  in the morning out of habit.  He states that he cannot sleep at night.  He is extremely tired during the day but he is able to fall asleep at night.  This morning, the patient felt extremely weak and dizzy and fell and lost his balance.  He did not injure himself but he states that he has no energy.  His blood pressure is extremely low.  He denies any symptoms of infection.  Specifically he denies any dysuria urgency frequency.  He denies any coughing or shortness of breath  or chest pain.  He denies any nausea or vomiting or diarrhea melena or hematochezia.  At that time, my plan was I believe the patient feels weak due to hypotension.  I recommended that he hold carvedilol and hydralazine  tonight.  I recommended that he discontinue hydralazine  altogether.  I recommended that his wife go through all of his medications with him and put them in a pillbox to ensure that they are being properly taken.  I am concerned that he may be getting confused and taking the medicine accidentally wrong.  We will monitor his blood pressure over the next week and see if his blood pressure improves.   Recommended discontinuation of Valium  in the morning.  Instead, take Valium  at night 2 mg to 4 mg as needed for insomnia.  Recheck in 1 week.  04/29/24 I have not heard back from the patient until now.  He is here today with his wife.  He states that his blood pressure is running extremely high.  His systolic blood pressure is 180 at home.  However he states that he is not missing any of his medication.  This will be in contradiction to our plan from last time where I told him to stop hydralazine .  Patient states that he also cannot sleep despite taking Valium  4 mg each night.  Therefore I asked his wife to go home and get the pillbox and bring it back to the office so we can manually go through his medications and see what he is actually taking.  My staff contacted his pharmacy who states that he has not gotten any prescriptions other than Valium  recently.  However he does get prescriptions through the [facility].  Once the wife brought the medications back, we went through all of his pills.  He was only taking 50 mg of hydralazine  3 times a day.  He was not taking valsartan  as prescribed.  He had ran out of that medication.  He was also on carbamazepine  which I assume nerve pain through the [facility].  Wife states that when he takes all of his medications in the morning, he becomes extremely dizzy and can barely walk.  She has to help him to the bed.  He continues to have trouble sleeping at night despite taking 4 mg of Valium   Past Medical History:  Diagnosis Date   Cholecystitis    Left bundle-branch block 02/26/2012   Neuropathy 02/26/2012   Peripheral neuropathy    Past Surgical History:  Procedure Laterality Date   APPENDECTOMY     CHOLECYSTECTOMY N/A 05/01/2014   Procedure: LAPAROSCOPIC CHOLECYSTECTOMY;  Surgeon: Dann FORBES Hummer, MD;  Location: [facility] OR;  Service: General;  Laterality: N/A;   HERNIA REPAIR     Current Outpatient Medications on File Prior to Visit  Medication Sig Dispense Refill   aspirin   81 MG tablet Take 81 mg by mouth daily.     atorvastatin (LIPITOR) 80 MG tablet Take 80 mg by mouth daily.     Calcium Carb-Cholecalciferol 600-10 MG-MCG TABS Take 1 tablet by mouth daily.     carvedilol (COREG) 12.5 MG tablet Take 37.5 mg by mouth 2 (two) times daily with a meal.     diazepam  (VALIUM ) 2 MG tablet Take 1-2 tablets (2-4 mg total) by mouth at bedtime as needed for anxiety (sleep). 60 tablet 1   diclofenac Sodium (VOLTAREN) 1 % GEL Apply 4 g topically 4 (four) times daily.     DULoxetine (CYMBALTA) 60 MG capsule Take 60 mg  by mouth daily.     finasteride (PROSCAR) 5 MG tablet TAKE ONE TABLET BY MOUTH EVERY DAY FOR PROSTATE. TAKE WITH TAMSULOSIN .     fluticasone  (FLONASE ) 50 MCG/ACT nasal spray INSTILL 2 SPRAYS INTO EACH NOSTRIL EVERY DAY ALLERGIES     gabapentin  (NEURONTIN ) 400 MG capsule Take 2 capsules (800 mg total) by mouth 3 (three) times daily. 9 capsule 0   hydrALAZINE  (APRESOLINE ) 100 MG tablet Take 100 mg by mouth every 8 (eight) hours.     hydrochlorothiazide  (HYDRODIURIL ) 25 MG tablet Take 1 tablet (25 mg total) by mouth daily. 90 tablet 3   melatonin 3 MG TABS tablet Take 9 mg by mouth at bedtime.     meloxicam (MOBIC) 15 MG tablet Take 15 mg by mouth daily as needed for pain.     Multiple Vitamins-Minerals (MULTIVITAMIN WITH MINERALS) tablet Take 1 tablet by mouth daily.     pantoprazole  (PROTONIX ) 40 MG tablet Take 1 tablet (40 mg total) by mouth daily. 30 tablet 3   polyethylene glycol (MIRALAX / GLYCOLAX) 17 g packet Take 17 g by mouth daily.     sodium chloride  (OCEAN) 0.65 % SOLN nasal spray Place 1 spray into both nostrils as needed for congestion. 88 mL 0   spironolactone (ALDACTONE) 25 MG tablet Take 25 mg by mouth daily.     tamsulosin  (FLOMAX ) 0.4 MG CAPS capsule Take 0.8 mg by mouth.     valsartan  (DIOVAN ) 160 MG tablet TAKE 1 TABLET BY MOUTH ONCE DAILY .  THIS  REPLACES  LOSARTAN  30 tablet 0   VITAMIN D, CHOLECALCIFEROL, PO Take 1 tablet by mouth daily.      zinc gluconate 50 MG tablet Take 50 mg by mouth daily.     No current facility-administered medications on file prior to visit.   No Known Allergies Social History   Socioeconomic History   Marital status: Married    Spouse name: Candis   Number of children: 2   Years of education: Not on file   Highest education level: Not on file  Occupational History   Not on file  Tobacco Use   Smoking status: Every Day    Current packs/day: 1.00    Average packs/day: 1 pack/day for 30.0 years (30.0 ttl pk-yrs)    Types: Cigarettes   Smokeless tobacco: Never  Vaping Use   Vaping status: Never Used  Substance and Sexual Activity   Alcohol use: No   Drug use: No   Sexual activity: Not on file  Other Topics Concern   Not on file  Social History Narrative   1 son and 1 daughter.   Social Drivers of Corporate investment banker Strain: Low Risk  (10/11/2021)   Overall Financial Resource Strain (CARDIA)    Difficulty of Paying Living Expenses: Not hard at all  Food Insecurity: No Food Insecurity (10/11/2021)   Hunger Vital Sign    Worried About Running Out of Food in the Last Year: Never true    Ran Out of Food in the Last Year: Never true  Transportation Needs: No Transportation Needs (07/11/2023)   Received from [facility] - Transportation    In the past 12 months, has lack of transportation kept you from medical appointments or from getting medications?: No    Lack of Transportation (Non-Medical): No  Physical Activity: Inactive (10/11/2021)   Exercise Vital Sign    Days of Exercise per Week: 0 days    Minutes of  Exercise per Session: 0 min  Stress: Stress Concern Present (10/11/2021)   [employer] of Occupational Health - Occupational Stress Questionnaire    Feeling of Stress : To some extent  Social Connections: Socially Integrated (10/11/2021)   Social Connection and Isolation Panel    Frequency of Communication with Friends and Family: More than  three times a week    Frequency of Social Gatherings with Friends and Family: More than three times a week    Attends Religious Services: 1 to 4 times per year    Active Member of [employer] or Organizations: No    Attends Banker Meetings: 1 to 4 times per year    Marital Status: Married  Catering manager Violence: Not At Risk (10/11/2021)   Humiliation, Afraid, Rape, and Kick questionnaire    Fear of Current or Ex-Partner: No    Emotionally Abused: No    Physically Abused: No    Sexually Abused: No      Review of Systems  Musculoskeletal:  Positive for back pain.  All other systems reviewed and are negative.      Objective:   Physical Exam Vitals reviewed.  Constitutional:      General: He is not in acute distress.    Appearance: He is not ill-appearing or toxic-appearing.  Cardiovascular:     Rate and Rhythm: Normal rate and regular rhythm.     Pulses: Normal pulses.     Heart sounds: Normal heart sounds.  Pulmonary:     Effort: Pulmonary effort is normal. No respiratory distress.     Breath sounds: No stridor or decreased air movement. No decreased breath sounds, wheezing, rhonchi or rales.  Chest:     Chest wall: No tenderness.  Abdominal:     General: Bowel sounds are normal. There is no distension.     Palpations: Abdomen is soft.     Tenderness: There is no abdominal tenderness. There is no guarding.  Musculoskeletal:     Lumbar back: Tenderness and bony tenderness present. Decreased range of motion.     Right lower leg: No edema.     Left lower leg: No edema.  Neurological:     General: No focal deficit present.     Mental Status: He is alert and oriented to person, place, and time.     Cranial Nerves: No cranial nerve deficit.     Sensory: No sensory deficit.     Motor: No weakness.     Coordination: Coordination normal.     Gait: Gait normal.           Assessment & Plan:  Polypharmacy - Plan: CBC with Differential/Platelet, Comprehensive  metabolic panel with GFR  Benign essential HTN  Primary insomnia First, regarding his blood pressure, I recommended starting valsartan  320 mg a day as he is supposed to be taking.  Recheck blood pressure in 1 week.  He can use hydralazine  50 mg 3 times a day as needed for blood pressure greater than 140/90.  However I only want him taking hydralazine  if his blood pressure is elevated.  I am doing this to avoid hypotension.  Second regarding polypharmacy, I feel that his symptoms in the morning are due to interactions of his drugs.  Given sedation and dizziness, I have recommended discontinuation of carbamazepine  as I do not feel that this is giving him great benefit.  I also recommended that he split up some of his medicines in the morning and take half in the morning and  half in the afternoon.  Lastly, I have recommended trying doxepin  6 mg at night for sleep

## 2024-04-30 LAB — CBC WITH DIFFERENTIAL/PLATELET
Absolute Lymphocytes: 1235 {cells}/uL (ref 850–3900)
Absolute Monocytes: 602 {cells}/uL (ref 200–950)
Basophils Absolute: 51 {cells}/uL (ref 0–200)
Basophils Relative: 0.8 %
Eosinophils Absolute: 173 {cells}/uL (ref 15–500)
Eosinophils Relative: 2.7 %
HCT: 34.4 % — ABNORMAL LOW (ref 38.5–50.0)
Hemoglobin: 12 g/dL — ABNORMAL LOW (ref 13.2–17.1)
MCH: 33 pg (ref 27.0–33.0)
MCHC: 34.9 g/dL (ref 32.0–36.0)
MCV: 94.5 fL (ref 80.0–100.0)
MPV: 10.5 fL (ref 7.5–12.5)
Monocytes Relative: 9.4 %
Neutro Abs: 4339 {cells}/uL (ref 1500–7800)
Neutrophils Relative %: 67.8 %
Platelets: 184 Thousand/uL (ref 140–400)
RBC: 3.64 Million/uL — ABNORMAL LOW (ref 4.20–5.80)
RDW: 12.4 % (ref 11.0–15.0)
Total Lymphocyte: 19.3 %
WBC: 6.4 Thousand/uL (ref 3.8–10.8)

## 2024-04-30 LAB — COMPREHENSIVE METABOLIC PANEL WITH GFR
AG Ratio: 2.1 (calc) (ref 1.0–2.5)
ALT: 24 U/L (ref 9–46)
AST: 23 U/L (ref 10–35)
Albumin: 4 g/dL (ref 3.6–5.1)
Alkaline phosphatase (APISO): 64 U/L (ref 35–144)
BUN: 14 mg/dL (ref 7–25)
CO2: 25 mmol/L (ref 20–32)
Calcium: 8.8 mg/dL (ref 8.6–10.3)
Chloride: 102 mmol/L (ref 98–110)
Creat: 0.7 mg/dL (ref 0.70–1.28)
Globulin: 1.9 g/dL (ref 1.9–3.7)
Glucose, Bld: 105 mg/dL — ABNORMAL HIGH (ref 65–99)
Potassium: 3.8 mmol/L (ref 3.5–5.3)
Sodium: 136 mmol/L (ref 135–146)
Total Bilirubin: 0.3 mg/dL (ref 0.2–1.2)
Total Protein: 5.9 g/dL — ABNORMAL LOW (ref 6.1–8.1)
eGFR: 94 mL/min/1.73m2 (ref >=60)

## 2024-05-02 ENCOUNTER — Ambulatory Visit: Payer: Self-pay | Admitting: Family Medicine

## 2024-05-03 ENCOUNTER — Other Ambulatory Visit: Payer: Self-pay | Admitting: Family Medicine

## 2024-05-03 NOTE — Telephone Encounter (Unsigned)
 Copied from CRM #[id number]. Topic: Clinical - Medication Refill >> May 03, 2024  9:27 AM Amy B wrote: Medication:  gabapentin  (NEURONTIN ) 400 MG capsule   Has the patient contacted their pharmacy? Yes (Agent: If no, request that the patient contact the pharmacy for the refill. If patient does not wish to contact the pharmacy document the reason why and proceed with request.) (Agent: If yes, when and what did the pharmacy advise?)  This is the patient's preferred pharmacy:  [facility] [address] [place] - [address] [place] [address] Phone: [phone] Fax: [phone]  Is this the correct pharmacy for this prescription? Yes If no, delete pharmacy and type the correct one.   Has the prescription been filled recently? No  Is the patient out of the medication? Yes  Has the patient been seen for an appointment in the last year OR does the patient have an upcoming appointment? Yes  Can we respond through MyChart? No  Agent: Please be advised that Rx refills may take up to 3 business days. We ask that you follow-up with your pharmacy.

## 2024-05-04 ENCOUNTER — Telehealth: Payer: Self-pay

## 2024-05-04 NOTE — Telephone Encounter (Signed)
 Copied from CRM #[id number]. Topic: Clinical - Medication Refill >> May 03, 2024  9:27 AM Amy B wrote: Medication:  gabapentin  (NEURONTIN ) 400 MG capsule   Has the patient contacted their pharmacy? Yes (Agent: If no, request that the patient contact the pharmacy for the refill. If patient does not wish to contact the pharmacy document the reason why and proceed with request.) (Agent: If yes, when and what did the pharmacy advise?)  This is the patient's preferred pharmacy:  [facility] [address] [place] - [address] [place] [address] Phone: [phone] Fax: [phone]  Is this the correct pharmacy for this prescription? Yes If no, delete pharmacy and type the correct one.   Has the prescription been filled recently? No  Is the patient out of the medication? Yes  Has the patient been seen for an appointment in the last year OR does the patient have an upcoming appointment? Yes  Can we respond through MyChart? No  Agent: Please be advised that Rx refills may take up to 3 business days. We ask that you follow-up with your pharmacy. >> May 04, 2024  2:24 PM Delon T wrote: Checking on status of request for a couple pills to hold patient over until his mail delivery comes in. [phone]

## 2024-05-05 ENCOUNTER — Other Ambulatory Visit: Payer: Self-pay

## 2024-05-05 ENCOUNTER — Other Ambulatory Visit: Payer: Self-pay | Admitting: Family Medicine

## 2024-05-05 ENCOUNTER — Telehealth: Payer: Self-pay

## 2024-05-05 ENCOUNTER — Ambulatory Visit: Admitting: Family Medicine

## 2024-05-05 DIAGNOSIS — R6510 Systemic inflammatory response syndrome (SIRS) of non-infectious origin without acute organ dysfunction: Secondary | ICD-10-CM

## 2024-05-05 DIAGNOSIS — G6289 Other specified polyneuropathies: Secondary | ICD-10-CM

## 2024-05-05 MED ORDER — ZOLPIDEM TARTRATE 10 MG PO TABS: 10.0000 mg | ORAL_TABLET | Freq: Every evening | ORAL | 0 refills | Status: DC | PRN

## 2024-05-05 MED ORDER — GABAPENTIN 400 MG PO CAPS: 800.0000 mg | ORAL_CAPSULE | Freq: Three times a day (TID) | ORAL | 0 refills | Status: AC

## 2024-05-05 NOTE — Telephone Encounter (Signed)
 Sent in today, 05/05/24.

## 2024-05-05 NOTE — Progress Notes (Signed)
 Ambien

## 2024-05-05 NOTE — Telephone Encounter (Signed)
 Pt wife states the medication prescribed to help with the patient's sleep, Doxepin , is not working. Candis asks if there is anything else you can recommend? Thank you.

## 2024-05-10 ENCOUNTER — Ambulatory Visit: Admitting: Family Medicine

## 2024-05-10 ENCOUNTER — Encounter: Payer: Self-pay | Admitting: Family Medicine

## 2024-05-10 VITALS — BP 136/62 | HR 55 | Ht 70.0 in | Wt 204.6 lb

## 2024-05-10 DIAGNOSIS — H532 Diplopia: Secondary | ICD-10-CM

## 2024-05-10 DIAGNOSIS — M6281 Muscle weakness (generalized): Secondary | ICD-10-CM

## 2024-05-10 DIAGNOSIS — R262 Difficulty in walking, not elsewhere classified: Secondary | ICD-10-CM | POA: Diagnosis not present

## 2024-05-10 NOTE — Progress Notes (Signed)
 Subjective:    Patient ID: James Fletcher, male    DOB: 08-Dec-1945, 78 y.o.   MRN: [id number] 02/15/24 Patient feels drunk every morning after he takes his medications.  He denies feeling like he is going to pass out.  He denies feeling cardiac arrhythmias or palpitations.  He denies feeling chest pain or shortness of breath.  He states that 1 hour after he takes medication, his wife states that him walk to the bathroom to get undressed because he feels so drunk/dizzy.  Recently was started on carbamazepine  at another physician's office for nerve pain in addition to the muscle relaxers and gabapentin .  At that time, my plan was: I believe the patient is dealing with polypharmacy.  This is either orthostatic hypotension due to blood pressure from medication or disequilibrium due to pain medication.  I suspect the latter.  Discontinue gabapentin , tegretol , and his muscle relaxer for 48 to 72 hours and see if symptoms improve.  If they do, we will need to reduce the dose of gabapentin  and carbamazepine  and muscle relaxer.  If not consider orthostatic hypotension although his blood pressure here today is slightly elevated.  Also consider cardiac arrhythmias, however this seems to be tied to his medication.  03/01/24 Patient states that he was unable to wean down on the gabapentin .  This exacerbated his anxiety related to sleep.  Therefore he is taking 800 mg of gabapentin  3 times a day.  He is taking 2 methocarbamol in the morning to methocarbamol in the evening.  He is also taking 2 mg of diazepam  in the morning on a scheduled basis regardless of whether he feels anxious.  At night he is having a difficult time falling asleep because he feels like he is smothering and he has panic attacks and is extremely anxious.  He is requesting a sleeping pill.  He continues to have dizziness and sedation in the mornings after his medication.  However his blood pressure is normal.  At that time, my plan was: I spent 30  minutes today with the patient and his wife explaining polypharmacy.  I believe the dizziness and sedation in the morning is coming from the combination of medications he is taking.  If he cannot stop gabapentin  I recommend discontinuing methocarbamol.  I see no reason for him to take this twice a day every day as a preventative.  I have also recommended that he stop Valium  in the morning.  He is not anxious in the morning.  Instead I recommended that he use between 2 to 4 mg of Valium  at night to try to help him sleep and prevent the panic attacks.  I believe that by reducing the medications in the morning he will actually feel better.  I do not want to add additional medication that can cause sedation or dizziness.  04/04/24 Patient did stop methocarbamol.  However he never started taking Valium  at night.  He is still taking Valium  in the morning out of habit.  He states that he cannot sleep at night.  He is extremely tired during the day but he is able to fall asleep at night.  This morning, the patient felt extremely weak and dizzy and fell and lost his balance.  He did not injure himself but he states that he has no energy.  His blood pressure is extremely low.  He denies any symptoms of infection.  Specifically he denies any dysuria urgency frequency.  He denies any coughing or shortness of breath  or chest pain.  He denies any nausea or vomiting or diarrhea melena or hematochezia.  At that time, my plan was I believe the patient feels weak due to hypotension.  I recommended that he hold carvedilol and hydralazine  tonight.  I recommended that he discontinue hydralazine  altogether.  I recommended that his wife go through all of his medications with him and put them in a pillbox to ensure that they are being properly taken.  I am concerned that he may be getting confused and taking the medicine accidentally wrong.  We will monitor his blood pressure over the next week and see if his blood pressure improves.   Recommended discontinuation of Valium  in the morning.  Instead, take Valium  at night 2 mg to 4 mg as needed for insomnia.  Recheck in 1 week.  04/29/24 I have not heard back from the patient until now.  He is here today with his wife.  He states that his blood pressure is running extremely high.  His systolic blood pressure is 180 at home.  However he states that he is not missing any of his medication.  This will be in contradiction to our plan from last time where I told him to stop hydralazine .  Patient states that he also cannot sleep despite taking Valium  4 mg each night.  Therefore I asked his wife to go home and get the pillbox and bring it back to the office so we can manually go through his medications and see what he is actually taking.  My staff contacted his pharmacy who states that he has not gotten any prescriptions other than Valium  recently.  However he does get prescriptions through the [facility].  Once the wife brought the medications back, we went through all of his pills.  He was only taking 50 mg of hydralazine  3 times a day.  He was not taking valsartan  as prescribed.  He had ran out of that medication.  He was also on carbamazepine  which I assume nerve pain through the [facility].  Wife states that when he takes all of his medications in the morning, he becomes extremely dizzy and can barely walk.  She has to help him to the bed.  He continues to have trouble sleeping at night despite taking 4 mg of Valium .  At that time, my plan was First, regarding his blood pressure, I recommended starting valsartan  320 mg a day as he is supposed to be taking.  Recheck blood pressure in 1 week.  He can use hydralazine  50 mg 3 times a day as needed for blood pressure greater than 140/90.  However I only want him taking hydralazine  if his blood pressure is elevated.  I am doing this to avoid hypotension.  Second regarding polypharmacy, I feel that his symptoms in the morning are due to interactions of his drugs.  Given  sedation and dizziness, I have recommended discontinuation of carbamazepine  as I do not feel that this is giving him great benefit.  I also recommended that he split up some of his medicines in the morning and take half in the morning and half in the afternoon.  Lastly, I have recommended trying doxepin  6 mg at night for sleep  05/10/24 Patient tried and failed doxepin . Stll not sleeping well. Has tried ambien  in past with delirium and confusion.  Now reports episodes of diplopia.  Continues to have episodes of dizziness. However, wife states that at times he becomes so weak, he is unable to stand and  walk even using a walker.  She has to physically help him into bed.  During these events, he is not somnolent or confused to suggest intoxication or medication side effects.   Past Medical History:  Diagnosis Date   Cholecystitis    Left bundle-branch block 02/26/2012   Neuropathy 02/26/2012   Peripheral neuropathy    Past Surgical History:  Procedure Laterality Date   APPENDECTOMY     CHOLECYSTECTOMY N/A 05/01/2014   Procedure: LAPAROSCOPIC CHOLECYSTECTOMY;  Surgeon: Dann FORBES Hummer, MD;  Location: [facility] OR;  Service: General;  Laterality: N/A;   HERNIA REPAIR     Current Outpatient Medications on File Prior to Visit  Medication Sig Dispense Refill   aspirin  81 MG tablet Take 81 mg by mouth daily.     atorvastatin (LIPITOR) 80 MG tablet Take 80 mg by mouth daily.     carvedilol (COREG) 12.5 MG tablet Take 37.5 mg by mouth 2 (two) times daily with a meal.     diazepam  (VALIUM ) 2 MG tablet Take 1-2 tablets (2-4 mg total) by mouth at bedtime as needed for anxiety (sleep). 60 tablet 1   Doxepin  HCl 6 MG TABS Take 1 tablet (6 mg total) by mouth at bedtime as needed (insomnia). 30 tablet 2   DULoxetine (CYMBALTA) 60 MG capsule Take 60 mg by mouth daily.     finasteride (PROSCAR) 5 MG tablet TAKE ONE TABLET BY MOUTH EVERY DAY FOR PROSTATE. TAKE WITH TAMSULOSIN .     gabapentin  (NEURONTIN ) 400 MG capsule  Take 2 capsules (800 mg total) by mouth 3 (three) times daily. 35 capsule 0   hydrALAZINE  (APRESOLINE ) 100 MG tablet Take 100 mg by mouth every 8 (eight) hours. (Patient taking differently: Take 50 mg by mouth 3 (three) times daily as needed (bp >140/90).)     hydrochlorothiazide  (HYDRODIURIL ) 25 MG tablet Take 1 tablet (25 mg total) by mouth daily. 90 tablet 3   melatonin 3 MG TABS tablet Take 9 mg by mouth at bedtime.     meloxicam (MOBIC) 15 MG tablet Take 15 mg by mouth daily as needed for pain.     spironolactone (ALDACTONE) 25 MG tablet Take 25 mg by mouth daily.     tamsulosin  (FLOMAX ) 0.4 MG CAPS capsule Take 0.8 mg by mouth.     valsartan  (DIOVAN ) 320 MG tablet Take 1 tablet (320 mg total) by mouth daily. 90 tablet 3   zolpidem  (AMBIEN ) 10 MG tablet Take 1 tablet (10 mg total) by mouth at bedtime as needed for sleep (stop diaepam, doxepin ). 30 tablet 0   No current facility-administered medications on file prior to visit.   No Known Allergies Social History   Socioeconomic History   Marital status: Married    Spouse name: Candis   Number of children: 2   Years of education: Not on file   Highest education level: Not on file  Occupational History   Not on file  Tobacco Use   Smoking status: Every Day    Current packs/day: 1.00    Average packs/day: 1 pack/day for 30.0 years (30.0 ttl pk-yrs)    Types: Cigarettes   Smokeless tobacco: Never  Vaping Use   Vaping status: Never Used  Substance and Sexual Activity   Alcohol use: No   Drug use: No   Sexual activity: Not on file  Other Topics Concern   Not on file  Social History Narrative   1 son and 1 daughter.   Social Drivers of Health  Financial Resource Strain: Low Risk  (10/11/2021)   Overall Financial Resource Strain (CARDIA)    Difficulty of Paying Living Expenses: Not hard at all  Food Insecurity: No Food Insecurity (10/11/2021)   Hunger Vital Sign    Worried About Running Out of Food in the Last Year: Never true     Ran Out of Food in the Last Year: Never true  Transportation Needs: No Transportation Needs (07/11/2023)   Received from [facility] - [facility]    In the past 12 months, has lack of transportation kept you from medical appointments or from getting medications?: No    Lack of Transportation (Non-Medical): No  Physical Activity: Inactive (10/11/2021)   Exercise Vital Sign    Days of Exercise per Week: 0 days    Minutes of Exercise per Session: 0 min  Stress: Stress Concern Present (10/11/2021)   [employer] of Occupational Health - Occupational Stress Questionnaire    Feeling of Stress : To some extent  Social Connections: Socially Integrated (10/11/2021)   Social Connection and Isolation Panel    Frequency of Communication with Friends and Family: More than three times a week    Frequency of Social Gatherings with Friends and Family: More than three times a week    Attends Religious Services: 1 to 4 times per year    Active Member of [employer] or Organizations: No    Attends Banker Meetings: 1 to 4 times per year    Marital Status: Married  Catering manager Violence: Not At Risk (10/11/2021)   Humiliation, Afraid, Rape, and Kick questionnaire    Fear of Current or Ex-Partner: No    Emotionally Abused: No    Physically Abused: No    Sexually Abused: No      Review of Systems  Musculoskeletal:  Positive for back pain.  All other systems reviewed and are negative.      Objective:   Physical Exam Vitals reviewed.  Constitutional:      General: He is not in acute distress.    Appearance: He is not ill-appearing or toxic-appearing.  Cardiovascular:     Rate and Rhythm: Normal rate and regular rhythm.     Pulses: Normal pulses.     Heart sounds: Normal heart sounds.  Pulmonary:     Effort: Pulmonary effort is normal. No respiratory distress.     Breath sounds: No stridor or decreased air movement. No decreased breath sounds,  wheezing, rhonchi or rales.  Chest:     Chest wall: No tenderness.  Abdominal:     General: Bowel sounds are normal. There is no distension.     Palpations: Abdomen is soft.     Tenderness: There is no abdominal tenderness. There is no guarding.  Musculoskeletal:     Lumbar back: Tenderness and bony tenderness present. Decreased range of motion.     Right lower leg: No edema.     Left lower leg: No edema.  Neurological:     General: No focal deficit present.     Mental Status: He is alert and oriented to person, place, and time.     Cranial Nerves: No cranial nerve deficit.     Sensory: No sensory deficit.     Motor: No weakness.     Coordination: Coordination normal.     Gait: Gait normal.           Assessment & Plan:  Diplopia  Muscle weakness  Trouble walking While dizziness  and trouble walking could be due to medications, severe transient weakness would not.  Diplopia raises concern for neuromuscular disorder like MG or MS.  Recommend neurology consultand MRI of brain to evaluate further.  Will not add additional meds for insomnia.

## 2024-05-11 ENCOUNTER — Other Ambulatory Visit: Payer: Self-pay | Admitting: Family Medicine

## 2024-05-11 DIAGNOSIS — R6510 Systemic inflammatory response syndrome (SIRS) of non-infectious origin without acute organ dysfunction: Secondary | ICD-10-CM

## 2024-05-11 DIAGNOSIS — G6289 Other specified polyneuropathies: Secondary | ICD-10-CM

## 2024-05-17 ENCOUNTER — Ambulatory Visit
Admission: RE | Admit: 2024-05-17 | Discharge: 2024-05-17 | Disposition: A | Discharge status: Home / Self Care | Source: Ambulatory Visit | Attending: Family Medicine | Admitting: Family Medicine

## 2024-05-17 DIAGNOSIS — H532 Diplopia: Secondary | ICD-10-CM

## 2024-05-17 DIAGNOSIS — M6281 Muscle weakness (generalized): Secondary | ICD-10-CM

## 2024-05-17 DIAGNOSIS — R262 Difficulty in walking, not elsewhere classified: Secondary | ICD-10-CM

## 2024-05-17 MED ORDER — GADOPICLENOL 0.5 MMOL/ML IV SOLN
10.0000 mL | Freq: Once | INTRAVENOUS | Status: AC | PRN
  Administered 2024-05-17: 10 mL via INTRAVENOUS

## 2024-05-19 ENCOUNTER — Ambulatory Visit: Payer: Self-pay | Admitting: Family Medicine

## 2024-05-26 ENCOUNTER — Telehealth: Payer: Self-pay

## 2024-05-26 NOTE — Telephone Encounter (Signed)
 Message sent to James Fletcher, referral coordinator as copied and pasted below, to check on Neurology referral:  Can you check on his Neurology referral for me? There is a note about him being scheduled but nothing about when. Thank you.

## 2024-05-31 ENCOUNTER — Encounter: Payer: Self-pay | Admitting: Neurology

## 2024-06-06 ENCOUNTER — Ambulatory Visit: Payer: Self-pay

## 2024-06-06 NOTE — Telephone Encounter (Signed)
 FYI Only or Action Required?: Action required by provider: clinical question for provider.  Patient was last seen in primary care on 05/10/2024 by Duanne Butler DASEN, MD.  Called Nurse Triage reporting Fall.  Symptoms began several weeks ago.  Interventions attempted: Nothing.  Symptoms are: stable.  Triage Disposition: Home Care  Patient/caregiver understands and will follow disposition?: Yes

## 2024-06-06 NOTE — Telephone Encounter (Signed)
 Reason for Disposition  Small cut (scratch) or abrasion (scrape) is also present  Answer Assessment - Initial Assessment Questions Pt is not sleeping well due to back pain and neuropathy. Due to not sleeping well, he falls asleep while using the commode in the middle of the night. In the last 2 weeks he has had 2 falls hitting his head. Wife states that nothing is new or worsened, her concern is she doesn't think that he shouldn't wait until 08/19/24 to see neurology. Asking for a referral elsewhere or what next steps are.     1. MECHANISM: How did the fall happen?     Last week (early in the week in the middle of the night)      3. ONSET: When did the fall happen? (e.g., minutes, hours, or days ago)   In the middle of the night due to falling asleep while on the commode 4. LOCATION: What part of the body hit the ground? (e.g., back, buttocks, head, hips, knees, hands, head, stomach)   head 5. INJURY: Did you hurt (injure) yourself when you fell? If Yes, ask: What did you injure? Tell me more about this? (e.g., body area; type of injury; pain severity)      6. PAIN: Is there any pain? If Yes, ask: How bad is the pain? (e.g., Scale 0-10; or none, mild,      Pain in back 7. SIZE: For cuts, bruises, or swelling, ask: How large is it? (e.g., inches or centimeters)      Knocked  a scab off knee from previous. Had a knot on head  9. OTHER SYMPTOMS: Do you have any other symptoms? (e.g., dizziness, fever, weakness; new-onset or worsening).      Blurred vision which is why he got MRI, cloudy brain, thought could be from medication, headaches- all this addressed previously with PCP 10. CAUSE: What do you think caused the fall (or falling)? (e.g., dizzy spell, tripped)       Sleepiness due to medication,  Protocols used: Falls and [facility]

## 2024-07-28 ENCOUNTER — Other Ambulatory Visit: Payer: Self-pay | Admitting: Family Medicine

## 2024-08-12 ENCOUNTER — Encounter: Payer: Self-pay | Admitting: Family Medicine

## 2024-08-12 ENCOUNTER — Ambulatory Visit: Admitting: Family Medicine

## 2024-08-12 VITALS — BP 138/68 | HR 58 | Temp 98.0°F | Ht 70.0 in | Wt 214.8 lb

## 2024-08-12 DIAGNOSIS — M7551 Bursitis of right shoulder: Secondary | ICD-10-CM | POA: Diagnosis not present

## 2024-08-12 DIAGNOSIS — M7150 Other bursitis, not elsewhere classified, unspecified site: Secondary | ICD-10-CM

## 2024-08-12 NOTE — Progress Notes (Signed)
 Subjective:    Patient ID: James Fletcher, male    DOB: 1946/02/02, 78 y.o.   MRN: [id number] Patient has fallen twice recently.  Approximately 1 week ago, he fell and struck his right shoulder on the door.  He now complains of pain in his right shoulder.  There is no tenderness to palpation around the shoulder joint.  There is no bruising.  There is no visible or gross deformity.  There is no tenderness over the distal clavicle or over the spine of the scapula.  However the patient has pain with passive range of motion.  He has pain with passive abduction greater than 100 degrees.  He has pain with resisted abduction.  He has a positive Hawking sign.  He has pain with internal and external rotation.  He is unable to lift his arm above 100 degrees. Past Medical History:  Diagnosis Date   Cholecystitis    Left bundle-branch block 02/26/2012   Neuropathy 02/26/2012   Peripheral neuropathy    Past Surgical History:  Procedure Laterality Date   APPENDECTOMY     CHOLECYSTECTOMY N/A 05/01/2014   Procedure: LAPAROSCOPIC CHOLECYSTECTOMY;  Surgeon: Dann FORBES Hummer, MD;  Location: [facility] OR;  Service: General;  Laterality: N/A;   HERNIA REPAIR     Current Outpatient Medications on File Prior to Visit  Medication Sig Dispense Refill   aspirin  81 MG tablet Take 81 mg by mouth daily.     atorvastatin (LIPITOR) 80 MG tablet Take 80 mg by mouth daily.     diazepam  (VALIUM ) 2 MG tablet Take 1-2 tablets (2-4 mg total) by mouth at bedtime as needed for anxiety (sleep). 60 tablet 1   Doxepin  HCl 6 MG TABS TAKE 1 TABLET BY MOUTH AT BEDTIME AS NEEDED FOR INSOMNIA 30 tablet 0   DULoxetine (CYMBALTA) 60 MG capsule Take 60 mg by mouth daily.     finasteride (PROSCAR) 5 MG tablet TAKE ONE TABLET BY MOUTH EVERY DAY FOR PROSTATE. TAKE WITH TAMSULOSIN .     gabapentin  (NEURONTIN ) 400 MG capsule Take 2 capsules (800 mg total) by mouth 3 (three) times daily. 35 capsule 0   hydrochlorothiazide  (HYDRODIURIL ) 25 MG tablet  Take 1 tablet (25 mg total) by mouth daily. 90 tablet 3   melatonin 3 MG TABS tablet Take 9 mg by mouth at bedtime.     meloxicam (MOBIC) 15 MG tablet Take 15 mg by mouth daily as needed for pain.     spironolactone (ALDACTONE) 25 MG tablet Take 25 mg by mouth daily.     tamsulosin  (FLOMAX ) 0.4 MG CAPS capsule Take 0.8 mg by mouth.     valsartan  (DIOVAN ) 320 MG tablet Take 1 tablet (320 mg total) by mouth daily. 90 tablet 3   carvedilol (COREG) 12.5 MG tablet Take 37.5 mg by mouth 2 (two) times daily with a meal.     hydrALAZINE  (APRESOLINE ) 100 MG tablet Take 100 mg by mouth every 8 (eight) hours. (Patient not taking: Reported on 08/12/2024)     No current facility-administered medications on file prior to visit.   No Known Allergies Social History   Socioeconomic History   Marital status: Married    Spouse name: Candis   Number of children: 2   Years of education: Not on file   Highest education level: Not on file  Occupational History   Not on file  Tobacco Use   Smoking status: Every Day    Current packs/day: 1.00    Average packs/day: 1  pack/day for 30.0 years (30.0 ttl pk-yrs)    Types: Cigarettes   Smokeless tobacco: Never  Vaping Use   Vaping status: Never Used  Substance and Sexual Activity   Alcohol use: No   Drug use: No   Sexual activity: Not on file  Other Topics Concern   Not on file  Social History Narrative   1 son and 1 daughter.   Social Drivers of Corporate Investment Banker Strain: Low Risk  (10/11/2021)   Overall Financial Resource Strain (CARDIA)    Difficulty of Paying Living Expenses: Not hard at all  Food Insecurity: No Food Insecurity (10/11/2021)   Hunger Vital Sign    Worried About Running Out of Food in the Last Year: Never true    Ran Out of Food in the Last Year: Never true  Transportation Needs: No Transportation Needs (07/11/2023)   Received from [facility] - [facility]    In the past 12 months, has lack of  transportation kept you from medical appointments or from getting medications?: No    Lack of Transportation (Non-Medical): No  Physical Activity: Inactive (10/11/2021)   Exercise Vital Sign    Days of Exercise per Week: 0 days    Minutes of Exercise per Session: 0 min  Stress: Stress Concern Present (10/11/2021)   [employer] of Occupational Health - Occupational Stress Questionnaire    Feeling of Stress : To some extent  Social Connections: Socially Integrated (10/11/2021)   Social Connection and Isolation Panel    Frequency of Communication with Friends and Family: More than three times a week    Frequency of Social Gatherings with Friends and Family: More than three times a week    Attends Religious Services: 1 to 4 times per year    Active Member of [employer] or Organizations: No    Attends Banker Meetings: 1 to 4 times per year    Marital Status: Married  Catering Manager Violence: Not At Risk (10/11/2021)   Humiliation, Afraid, Rape, and Kick questionnaire    Fear of Current or Ex-Partner: No    Emotionally Abused: No    Physically Abused: No    Sexually Abused: No      Review of Systems  Musculoskeletal:  Positive for back pain.  All other systems reviewed and are negative.      Objective:   Physical Exam Vitals reviewed.  Constitutional:      General: He is not in acute distress.    Appearance: He is not ill-appearing or toxic-appearing.  Cardiovascular:     Rate and Rhythm: Normal rate and regular rhythm.     Pulses: Normal pulses.     Heart sounds: Normal heart sounds.  Pulmonary:     Effort: Pulmonary effort is normal. No respiratory distress.     Breath sounds: No stridor or decreased air movement. No decreased breath sounds, wheezing, rhonchi or rales.  Chest:     Chest wall: No tenderness.  Abdominal:     General: Bowel sounds are normal. There is no distension.     Palpations: Abdomen is soft.     Tenderness: There is no abdominal tenderness.  There is no guarding.  Musculoskeletal:     Right shoulder: No swelling, effusion, tenderness or bony tenderness. Decreased range of motion. Decreased strength.     Lumbar back: Tenderness and bony tenderness present. Decreased range of motion.     Right lower leg: No edema.  Left lower leg: No edema.  Neurological:     General: No focal deficit present.     Mental Status: He is alert and oriented to person, place, and time.     Cranial Nerves: No cranial nerve deficit.     Sensory: No sensory deficit.     Motor: No weakness.     Coordination: Coordination normal.     Gait: Gait normal.           Assessment & Plan:  Traumatic bursitis I believe the patient has traumatic bursitis in the subacromial bursa.  Using sterile technique, I injected the right subacromial bursa with 2 cc of lidocaine , 2 cc of Marcaine , and 2 cc of 40 mg/mL Kenalog.  Patient tolerated the procedure without complication.  His blood pressure is highly variable today.  I recommended the patient monitor his blood pressure 6 or 7 times over the weekend and report the values to me next week to determine if we need to adjust his medication.  Lastly he also reports loose watery stools for several months.  I recommended that he try Imodium along with Metamucil.  If the stools are not improving over the next week, he would need additional workup

## 2024-08-18 MED ORDER — TRIAMCINOLONE ACETONIDE 40 MG/ML IJ SUSP
80.0000 mg | Freq: Once | INTRAMUSCULAR | Status: AC
  Administered 2024-08-12: 80 mg via INTRA_ARTICULAR

## 2024-08-18 NOTE — Addendum Note (Signed)
 Addended by: ANGELENA RONAL BRADLEY K on: 08/18/2024 09:32 AM   Modules accepted: Orders

## 2024-08-22 ENCOUNTER — Ambulatory Visit: Admitting: Neurology

## 2024-08-22 ENCOUNTER — Encounter: Payer: Self-pay | Admitting: Neurology

## 2024-11-28 ENCOUNTER — Ambulatory Visit: Payer: Self-pay | Admitting: Neurology
